# Patient Record
Sex: Female | Born: 1980 | Race: White | Hispanic: No | Marital: Married | Smoking: Former smoker
Health system: Southern US, Community
[De-identification: ages and names within clinical notes are randomized; demographics above are authoritative.]

## PROBLEM LIST (undated history)

## (undated) DIAGNOSIS — K219 Gastro-esophageal reflux disease without esophagitis: Secondary | ICD-10-CM

## (undated) DIAGNOSIS — K648 Other hemorrhoids: Secondary | ICD-10-CM

## (undated) DIAGNOSIS — T7840XA Allergy, unspecified, initial encounter: Secondary | ICD-10-CM

## (undated) DIAGNOSIS — J45909 Unspecified asthma, uncomplicated: Secondary | ICD-10-CM

## (undated) DIAGNOSIS — M1611 Unilateral primary osteoarthritis, right hip: Secondary | ICD-10-CM

## (undated) DIAGNOSIS — F419 Anxiety disorder, unspecified: Secondary | ICD-10-CM

## (undated) DIAGNOSIS — J189 Pneumonia, unspecified organism: Secondary | ICD-10-CM

## (undated) HISTORY — PX: COLONOSCOPY: SHX174

## (undated) HISTORY — DX: Other hemorrhoids: K64.8

## (undated) HISTORY — DX: Allergy, unspecified, initial encounter: T78.40XA

---

## 2000-07-21 HISTORY — PX: OVARIAN CYST SURGERY: SHX726

## 2000-11-11 ENCOUNTER — Emergency Department (HOSPITAL_COMMUNITY): Admission: EM | Admit: 2000-11-11 | Discharge: 2000-11-11 | Payer: Self-pay | Admitting: Emergency Medicine

## 2001-09-29 ENCOUNTER — Emergency Department (HOSPITAL_COMMUNITY): Admission: EM | Admit: 2001-09-29 | Discharge: 2001-09-29 | Payer: Self-pay | Admitting: Emergency Medicine

## 2002-01-12 ENCOUNTER — Emergency Department (HOSPITAL_COMMUNITY): Admission: EM | Admit: 2002-01-12 | Discharge: 2002-01-12 | Payer: Self-pay | Admitting: Emergency Medicine

## 2002-02-05 ENCOUNTER — Encounter: Payer: Self-pay | Admitting: Emergency Medicine

## 2002-02-05 ENCOUNTER — Emergency Department (HOSPITAL_COMMUNITY): Admission: EM | Admit: 2002-02-05 | Discharge: 2002-02-05 | Payer: Self-pay | Admitting: Emergency Medicine

## 2002-03-25 ENCOUNTER — Emergency Department (HOSPITAL_COMMUNITY): Admission: EM | Admit: 2002-03-25 | Discharge: 2002-03-25 | Payer: Self-pay | Admitting: Emergency Medicine

## 2002-06-23 ENCOUNTER — Emergency Department (HOSPITAL_COMMUNITY): Admission: EM | Admit: 2002-06-23 | Discharge: 2002-06-23 | Payer: Self-pay | Admitting: Emergency Medicine

## 2002-07-07 ENCOUNTER — Emergency Department (HOSPITAL_COMMUNITY): Admission: EM | Admit: 2002-07-07 | Discharge: 2002-07-07 | Payer: Self-pay | Admitting: Emergency Medicine

## 2002-12-19 ENCOUNTER — Emergency Department (HOSPITAL_COMMUNITY): Admission: EM | Admit: 2002-12-19 | Discharge: 2002-12-19 | Payer: Self-pay | Admitting: Emergency Medicine

## 2003-09-15 ENCOUNTER — Emergency Department (HOSPITAL_COMMUNITY): Admission: EM | Admit: 2003-09-15 | Discharge: 2003-09-15 | Payer: Self-pay | Admitting: Emergency Medicine

## 2003-09-22 ENCOUNTER — Emergency Department (HOSPITAL_COMMUNITY): Admission: EM | Admit: 2003-09-22 | Discharge: 2003-09-22 | Payer: Self-pay | Admitting: Emergency Medicine

## 2004-02-06 ENCOUNTER — Inpatient Hospital Stay (HOSPITAL_COMMUNITY): Admission: RE | Admit: 2004-02-06 | Discharge: 2004-02-10 | Payer: Self-pay | Admitting: Psychiatry

## 2004-05-10 ENCOUNTER — Emergency Department: Payer: Self-pay | Admitting: Unknown Physician Specialty

## 2004-08-31 ENCOUNTER — Emergency Department: Payer: Self-pay | Admitting: Emergency Medicine

## 2004-09-30 ENCOUNTER — Emergency Department: Payer: Self-pay | Admitting: Emergency Medicine

## 2004-11-17 ENCOUNTER — Emergency Department: Payer: Self-pay | Admitting: Emergency Medicine

## 2004-12-20 ENCOUNTER — Emergency Department: Payer: Self-pay | Admitting: General Practice

## 2004-12-22 ENCOUNTER — Emergency Department: Payer: Self-pay | Admitting: Emergency Medicine

## 2004-12-22 ENCOUNTER — Other Ambulatory Visit: Payer: Self-pay

## 2004-12-23 ENCOUNTER — Ambulatory Visit: Payer: Self-pay | Admitting: Emergency Medicine

## 2005-01-07 ENCOUNTER — Emergency Department: Payer: Self-pay | Admitting: Emergency Medicine

## 2005-01-19 ENCOUNTER — Emergency Department (HOSPITAL_COMMUNITY): Admission: EM | Admit: 2005-01-19 | Discharge: 2005-01-20 | Payer: Self-pay

## 2005-01-24 ENCOUNTER — Emergency Department (HOSPITAL_COMMUNITY): Admission: EM | Admit: 2005-01-24 | Discharge: 2005-01-24 | Payer: Self-pay | Admitting: Emergency Medicine

## 2005-02-03 ENCOUNTER — Inpatient Hospital Stay (HOSPITAL_COMMUNITY): Admission: AD | Admit: 2005-02-03 | Discharge: 2005-02-03 | Payer: Self-pay | Admitting: Obstetrics & Gynecology

## 2005-02-05 ENCOUNTER — Inpatient Hospital Stay (HOSPITAL_COMMUNITY): Admission: AD | Admit: 2005-02-05 | Discharge: 2005-02-05 | Payer: Self-pay | Admitting: Obstetrics and Gynecology

## 2005-02-13 ENCOUNTER — Ambulatory Visit: Payer: Self-pay | Admitting: Obstetrics and Gynecology

## 2005-03-10 ENCOUNTER — Ambulatory Visit (HOSPITAL_COMMUNITY): Admission: RE | Admit: 2005-03-10 | Discharge: 2005-03-10 | Payer: Self-pay | Admitting: Obstetrics and Gynecology

## 2005-03-10 ENCOUNTER — Ambulatory Visit: Payer: Self-pay | Admitting: Obstetrics and Gynecology

## 2005-04-08 ENCOUNTER — Ambulatory Visit: Payer: Self-pay | Admitting: Obstetrics and Gynecology

## 2005-04-13 ENCOUNTER — Inpatient Hospital Stay (HOSPITAL_COMMUNITY): Admission: AD | Admit: 2005-04-13 | Discharge: 2005-04-13 | Payer: Self-pay | Admitting: Obstetrics & Gynecology

## 2005-05-13 ENCOUNTER — Ambulatory Visit: Payer: Self-pay | Admitting: *Deleted

## 2005-07-12 ENCOUNTER — Emergency Department: Payer: Self-pay | Admitting: Emergency Medicine

## 2005-07-29 ENCOUNTER — Ambulatory Visit: Payer: Self-pay | Admitting: *Deleted

## 2005-10-14 ENCOUNTER — Ambulatory Visit: Payer: Self-pay | Admitting: *Deleted

## 2006-01-15 ENCOUNTER — Ambulatory Visit: Payer: Self-pay | Admitting: Obstetrics and Gynecology

## 2006-02-08 ENCOUNTER — Inpatient Hospital Stay (HOSPITAL_COMMUNITY): Admission: AD | Admit: 2006-02-08 | Discharge: 2006-02-08 | Payer: Self-pay | Admitting: Gynecology

## 2006-02-18 ENCOUNTER — Encounter (INDEPENDENT_AMBULATORY_CARE_PROVIDER_SITE_OTHER): Payer: Self-pay | Admitting: *Deleted

## 2006-02-18 ENCOUNTER — Ambulatory Visit: Payer: Self-pay | Admitting: Gynecology

## 2006-02-24 ENCOUNTER — Emergency Department (HOSPITAL_COMMUNITY): Admission: EM | Admit: 2006-02-24 | Discharge: 2006-02-24 | Payer: Self-pay | Admitting: Family Medicine

## 2006-04-02 ENCOUNTER — Ambulatory Visit: Payer: Self-pay | Admitting: Family Medicine

## 2006-04-13 ENCOUNTER — Emergency Department: Payer: Self-pay | Admitting: Emergency Medicine

## 2006-04-13 ENCOUNTER — Other Ambulatory Visit: Payer: Self-pay

## 2006-05-13 ENCOUNTER — Emergency Department (HOSPITAL_COMMUNITY): Admission: EM | Admit: 2006-05-13 | Discharge: 2006-05-14 | Payer: Self-pay | Admitting: Emergency Medicine

## 2006-06-24 ENCOUNTER — Ambulatory Visit: Payer: Self-pay | Admitting: Gynecology

## 2006-09-24 ENCOUNTER — Emergency Department (HOSPITAL_COMMUNITY): Admission: EM | Admit: 2006-09-24 | Discharge: 2006-09-24 | Payer: Self-pay | Admitting: Emergency Medicine

## 2007-01-08 ENCOUNTER — Emergency Department: Payer: Self-pay | Admitting: Emergency Medicine

## 2007-03-30 ENCOUNTER — Inpatient Hospital Stay (HOSPITAL_COMMUNITY): Admission: AD | Admit: 2007-03-30 | Discharge: 2007-03-30 | Payer: Self-pay | Admitting: Obstetrics & Gynecology

## 2007-04-02 ENCOUNTER — Inpatient Hospital Stay (HOSPITAL_COMMUNITY): Admission: AD | Admit: 2007-04-02 | Discharge: 2007-04-02 | Payer: Self-pay | Admitting: Obstetrics & Gynecology

## 2007-04-09 ENCOUNTER — Inpatient Hospital Stay (HOSPITAL_COMMUNITY): Admission: AD | Admit: 2007-04-09 | Discharge: 2007-04-09 | Payer: Self-pay | Admitting: Obstetrics and Gynecology

## 2007-04-19 ENCOUNTER — Emergency Department: Payer: Self-pay | Admitting: Emergency Medicine

## 2007-08-01 ENCOUNTER — Observation Stay: Payer: Self-pay | Admitting: Obstetrics and Gynecology

## 2007-09-13 ENCOUNTER — Emergency Department: Payer: Self-pay | Admitting: Emergency Medicine

## 2007-10-23 ENCOUNTER — Observation Stay: Payer: Self-pay

## 2007-11-10 ENCOUNTER — Emergency Department: Payer: Self-pay | Admitting: Emergency Medicine

## 2007-11-17 ENCOUNTER — Observation Stay: Payer: Self-pay

## 2007-11-19 ENCOUNTER — Observation Stay: Payer: Self-pay | Admitting: Obstetrics and Gynecology

## 2007-11-25 ENCOUNTER — Observation Stay: Payer: Self-pay | Admitting: Obstetrics and Gynecology

## 2007-12-01 ENCOUNTER — Inpatient Hospital Stay: Payer: Self-pay | Admitting: Obstetrics & Gynecology

## 2008-07-21 HISTORY — PX: TONSILLECTOMY: SUR1361

## 2008-07-21 HISTORY — PX: CARPAL TUNNEL RELEASE: SHX101

## 2008-08-15 ENCOUNTER — Emergency Department: Payer: Self-pay | Admitting: Emergency Medicine

## 2008-10-09 ENCOUNTER — Emergency Department: Payer: Self-pay | Admitting: Emergency Medicine

## 2008-12-30 ENCOUNTER — Emergency Department: Payer: Self-pay | Admitting: Emergency Medicine

## 2009-01-19 ENCOUNTER — Emergency Department: Payer: Self-pay | Admitting: Emergency Medicine

## 2009-02-17 ENCOUNTER — Emergency Department: Payer: Self-pay | Admitting: Emergency Medicine

## 2009-02-22 ENCOUNTER — Emergency Department: Payer: Self-pay | Admitting: Emergency Medicine

## 2009-04-05 ENCOUNTER — Ambulatory Visit: Payer: Self-pay | Admitting: Internal Medicine

## 2009-05-16 ENCOUNTER — Ambulatory Visit: Payer: Self-pay | Admitting: Orthopedic Surgery

## 2009-05-24 ENCOUNTER — Emergency Department: Payer: Self-pay | Admitting: Emergency Medicine

## 2009-05-25 ENCOUNTER — Emergency Department (HOSPITAL_COMMUNITY): Admission: EM | Admit: 2009-05-25 | Discharge: 2009-05-25 | Payer: Self-pay | Admitting: Emergency Medicine

## 2009-06-16 ENCOUNTER — Emergency Department: Payer: Self-pay | Admitting: Emergency Medicine

## 2009-06-21 ENCOUNTER — Ambulatory Visit: Payer: Self-pay | Admitting: Gastroenterology

## 2009-07-25 ENCOUNTER — Ambulatory Visit: Payer: Self-pay | Admitting: Rheumatology

## 2009-08-10 ENCOUNTER — Ambulatory Visit: Payer: Self-pay | Admitting: Specialist

## 2009-08-13 ENCOUNTER — Ambulatory Visit: Payer: Self-pay | Admitting: Specialist

## 2009-09-19 ENCOUNTER — Ambulatory Visit: Payer: Self-pay | Admitting: Pain Medicine

## 2009-10-11 ENCOUNTER — Ambulatory Visit: Payer: Self-pay | Admitting: Pain Medicine

## 2010-01-06 ENCOUNTER — Ambulatory Visit: Payer: Self-pay | Admitting: Pulmonary Disease

## 2010-01-06 ENCOUNTER — Inpatient Hospital Stay (HOSPITAL_COMMUNITY): Admission: EM | Admit: 2010-01-06 | Discharge: 2010-01-11 | Payer: Self-pay | Admitting: Pulmonary Disease

## 2010-01-06 ENCOUNTER — Emergency Department: Payer: Self-pay

## 2010-01-07 ENCOUNTER — Ambulatory Visit: Payer: Self-pay | Admitting: Psychiatry

## 2010-01-08 ENCOUNTER — Ambulatory Visit: Payer: Self-pay | Admitting: Psychiatry

## 2010-01-09 ENCOUNTER — Ambulatory Visit: Payer: Self-pay | Admitting: Psychiatry

## 2010-01-10 ENCOUNTER — Ambulatory Visit: Payer: Self-pay | Admitting: Psychiatry

## 2010-01-11 ENCOUNTER — Inpatient Hospital Stay (HOSPITAL_COMMUNITY): Admission: EM | Admit: 2010-01-11 | Discharge: 2010-01-16 | Payer: Self-pay | Admitting: Psychiatry

## 2010-01-11 ENCOUNTER — Ambulatory Visit: Payer: Self-pay | Admitting: Psychiatry

## 2010-03-17 ENCOUNTER — Emergency Department: Payer: Self-pay | Admitting: Emergency Medicine

## 2010-09-15 ENCOUNTER — Emergency Department: Payer: Self-pay | Admitting: Emergency Medicine

## 2010-10-06 LAB — CULTURE, RESPIRATORY W GRAM STAIN

## 2010-10-06 LAB — URINE MICROSCOPIC-ADD ON

## 2010-10-06 LAB — CBC
HCT: 30.7 % — ABNORMAL LOW (ref 36.0–46.0)
HCT: 32 % — ABNORMAL LOW (ref 36.0–46.0)
HCT: 33.4 % — ABNORMAL LOW (ref 36.0–46.0)
Hemoglobin: 10.2 g/dL — ABNORMAL LOW (ref 12.0–15.0)
Hemoglobin: 11 g/dL — ABNORMAL LOW (ref 12.0–15.0)
Hemoglobin: 11.2 g/dL — ABNORMAL LOW (ref 12.0–15.0)
Hemoglobin: 11.4 g/dL — ABNORMAL LOW (ref 12.0–15.0)
MCH: 32.6 pg (ref 26.0–34.0)
MCH: 33.2 pg (ref 26.0–34.0)
MCHC: 34.1 g/dL (ref 30.0–36.0)
MCHC: 34.3 g/dL (ref 30.0–36.0)
MCHC: 35 g/dL (ref 30.0–36.0)
MCV: 94.8 fL (ref 78.0–100.0)
MCV: 95 fL (ref 78.0–100.0)
MCV: 95.1 fL (ref 78.0–100.0)
MCV: 96.2 fL (ref 78.0–100.0)
Platelets: 102 10*3/uL — ABNORMAL LOW (ref 150–400)
Platelets: 105 10*3/uL — ABNORMAL LOW (ref 150–400)
Platelets: 125 10*3/uL — ABNORMAL LOW (ref 150–400)
Platelets: 185 10*3/uL (ref 150–400)
RBC: 3.2 MIL/uL — ABNORMAL LOW (ref 3.87–5.11)
RBC: 3.37 MIL/uL — ABNORMAL LOW (ref 3.87–5.11)
RBC: 3.47 MIL/uL — ABNORMAL LOW (ref 3.87–5.11)
RBC: 3.53 MIL/uL — ABNORMAL LOW (ref 3.87–5.11)
RDW: 13.6 % (ref 11.5–15.5)
RDW: 13.7 % (ref 11.5–15.5)
RDW: 13.9 % (ref 11.5–15.5)
WBC: 11.6 10*3/uL — ABNORMAL HIGH (ref 4.0–10.5)

## 2010-10-06 LAB — BLOOD GAS, ARTERIAL
O2 Saturation: 94.9 %
PEEP: 5 cmH2O
PEEP: 5 cmH2O
Pressure support: 5 cmH2O
RATE: 16 resp/min
pCO2 arterial: 39.2 mmHg (ref 35.0–45.0)
pH, Arterial: 7.299 — ABNORMAL LOW (ref 7.350–7.400)
pO2, Arterial: 73.9 mmHg — ABNORMAL LOW (ref 80.0–100.0)
pO2, Arterial: 77.5 mmHg — ABNORMAL LOW (ref 80.0–100.0)

## 2010-10-06 LAB — CARDIAC PANEL(CRET KIN+CKTOT+MB+TROPI)
CK, MB: 52.7 ng/mL (ref 0.3–4.0)
Relative Index: 3.3 — ABNORMAL HIGH (ref 0.0–2.5)
Total CK: 1598 U/L — ABNORMAL HIGH (ref 7–177)
Troponin I: 0.09 ng/mL — ABNORMAL HIGH (ref 0.00–0.06)

## 2010-10-06 LAB — DIFFERENTIAL
Basophils Absolute: 0 10*3/uL (ref 0.0–0.1)
Basophils Absolute: 0 10*3/uL (ref 0.0–0.1)
Eosinophils Absolute: 0 10*3/uL (ref 0.0–0.7)
Eosinophils Relative: 0 % (ref 0–5)
Lymphocytes Relative: 5 % — ABNORMAL LOW (ref 12–46)
Lymphocytes Relative: 8 % — ABNORMAL LOW (ref 12–46)
Monocytes Absolute: 0.6 10*3/uL (ref 0.1–1.0)
Neutro Abs: 8.6 10*3/uL — ABNORMAL HIGH (ref 1.7–7.7)
Neutrophils Relative %: 92 % — ABNORMAL HIGH (ref 43–77)

## 2010-10-06 LAB — RAPID URINE DRUG SCREEN, HOSP PERFORMED
Amphetamines: NOT DETECTED
Opiates: POSITIVE — AB
Tetrahydrocannabinol: NOT DETECTED

## 2010-10-06 LAB — BASIC METABOLIC PANEL
BUN: 2 mg/dL — ABNORMAL LOW (ref 6–23)
BUN: 6 mg/dL (ref 6–23)
CO2: 32 mEq/L (ref 19–32)
CO2: 33 mEq/L — ABNORMAL HIGH (ref 19–32)
Calcium: 7 mg/dL — ABNORMAL LOW (ref 8.4–10.5)
Chloride: 101 mEq/L (ref 96–112)
Chloride: 109 mEq/L (ref 96–112)
Chloride: 97 mEq/L (ref 96–112)
Creatinine, Ser: 0.63 mg/dL (ref 0.4–1.2)
GFR calc Af Amer: >60 mL/min (ref >60)
GFR calc Af Amer: >60 mL/min (ref >60)
GFR calc non Af Amer: >60 mL/min (ref >60)
Glucose, Bld: 86 mg/dL (ref 70–99)
Glucose, Bld: 98 mg/dL (ref 70–99)
Glucose, Bld: 99 mg/dL (ref 70–99)
Potassium: 2.8 mEq/L — ABNORMAL LOW (ref 3.5–5.1)
Potassium: 3.8 mEq/L (ref 3.5–5.1)
Sodium: 136 mEq/L (ref 135–145)
Sodium: 138 mEq/L (ref 135–145)
Sodium: 139 mEq/L (ref 135–145)

## 2010-10-06 LAB — URINALYSIS, ROUTINE W REFLEX MICROSCOPIC
Bilirubin Urine: NEGATIVE
Ketones, ur: 40 mg/dL — AB
Nitrite: NEGATIVE

## 2010-10-06 LAB — COMPREHENSIVE METABOLIC PANEL
ALT: 96 U/L — ABNORMAL HIGH (ref 0–35)
Albumin: 2.9 g/dL — ABNORMAL LOW (ref 3.5–5.2)
Calcium: 6.6 mg/dL — ABNORMAL LOW (ref 8.4–10.5)
Glucose, Bld: 70 mg/dL (ref 70–99)
Potassium: 3.8 mEq/L (ref 3.5–5.1)
Sodium: 140 mEq/L (ref 135–145)
Total Protein: 5 g/dL — ABNORMAL LOW (ref 6.0–8.3)

## 2010-10-06 LAB — URINE CULTURE: Special Requests: NEGATIVE

## 2010-10-06 LAB — CULTURE, BLOOD (ROUTINE X 2)

## 2010-10-06 LAB — LIPASE, BLOOD: Lipase: 25 U/L (ref 11–59)

## 2010-10-06 LAB — ACETAMINOPHEN LEVEL: Acetaminophen (Tylenol), Serum: <10 ug/mL — ABNORMAL LOW (ref 10–30)

## 2010-10-06 LAB — AMYLASE: Amylase: 164 U/L — ABNORMAL HIGH (ref 0–105)

## 2010-10-19 ENCOUNTER — Emergency Department: Payer: Self-pay | Admitting: Internal Medicine

## 2010-10-22 ENCOUNTER — Inpatient Hospital Stay: Payer: Self-pay | Admitting: Internal Medicine

## 2010-12-03 IMAGING — CT CT HEAD W/O CM
1 of 2 series · 13 of 30 positions shown, 17 images · non-contrast
Comparison: None.

CLINICAL DATA: Drug overdose/altered mental status/respiratory
failure

CT HEAD WITHOUT CONTRAST
TECHNIQUE: Contiguous axial images were obtained from the base of
the skull through the vertex without contrast.

[Series 2: brain · axial · 0.47mm/px · z∈[+143,+261]mm · 13 of 48 slices shown, 17 images]
[im 4/48  brain]
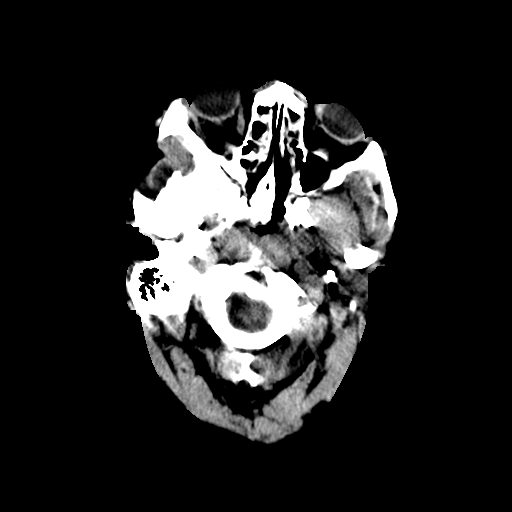
[im 4/48  bone]
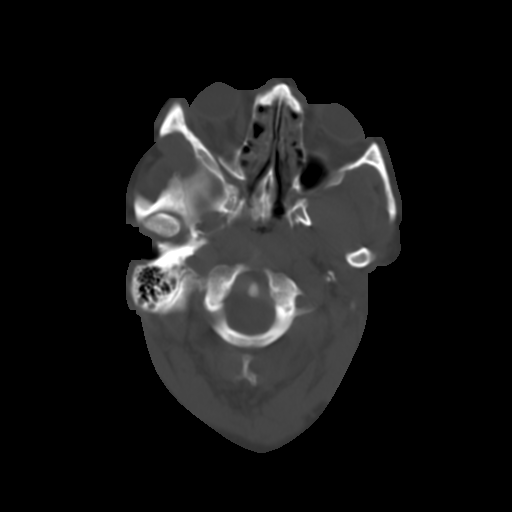
[im 7/48  brain]
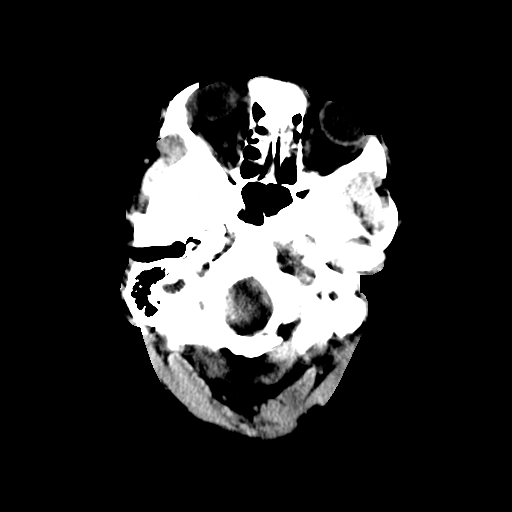
[im 11/48  brain]
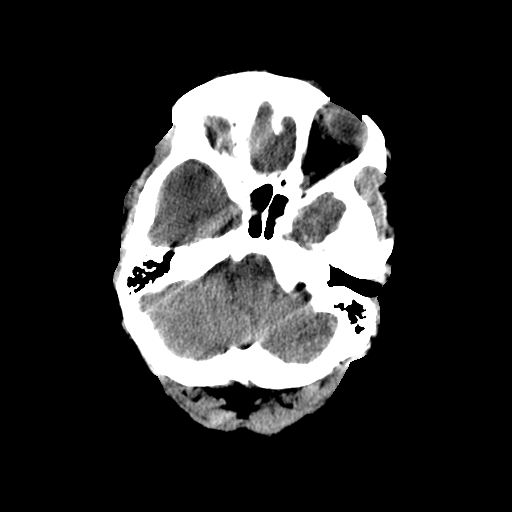
[im 14/48  brain]
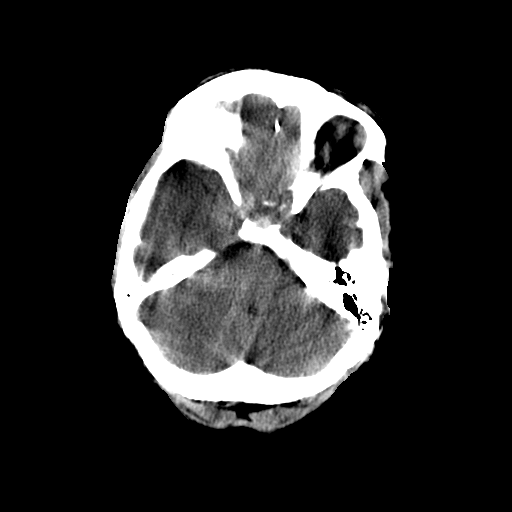
[im 17/48  brain]
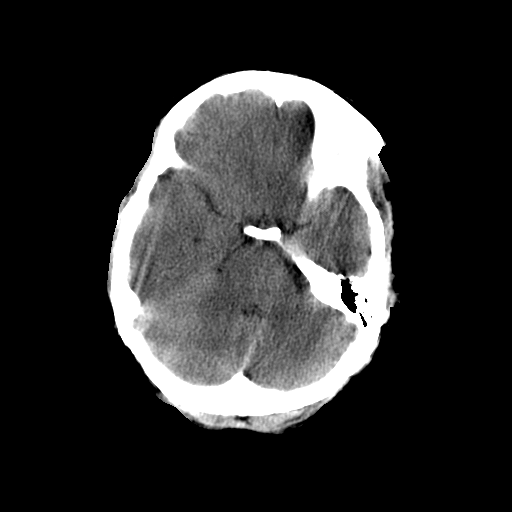
[im 17/48  bone]
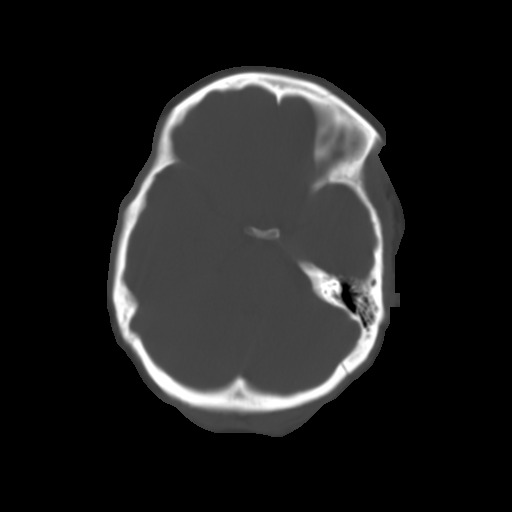
[im 21/48  brain]
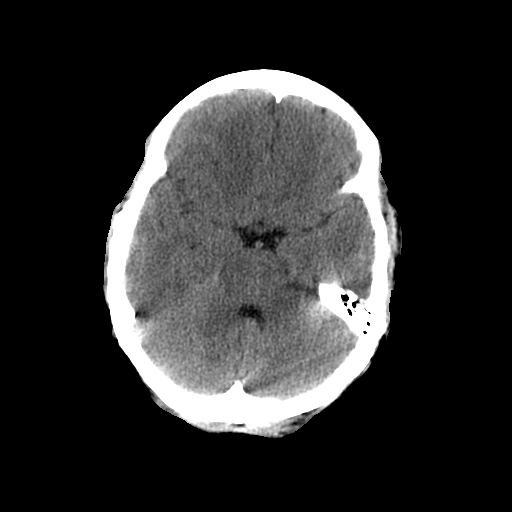
[im 24/48  brain]
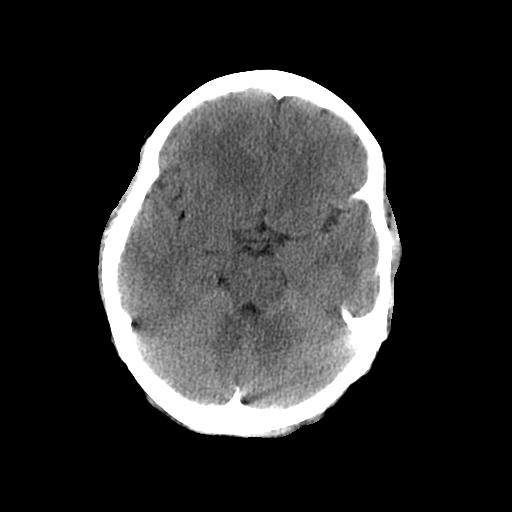
[im 27/48  brain]
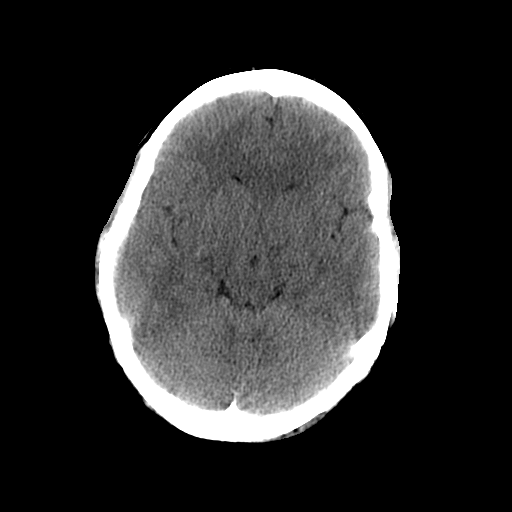
[im 31/48  brain]
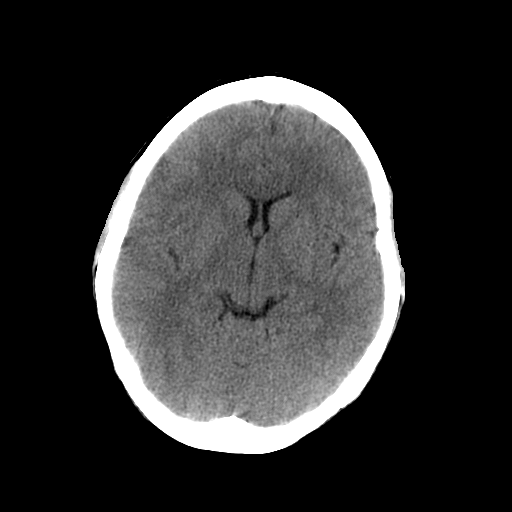
[im 31/48  bone]
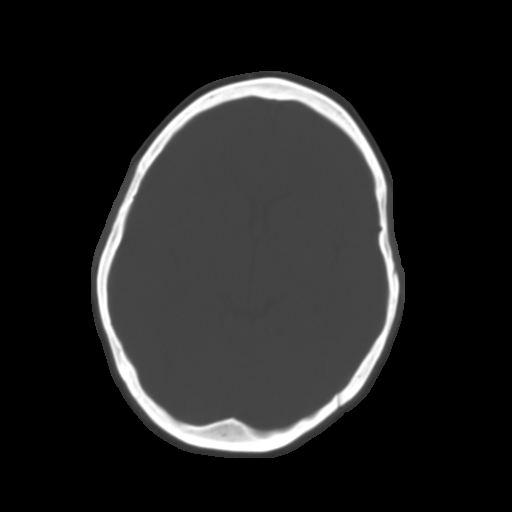
[im 34/48  brain]
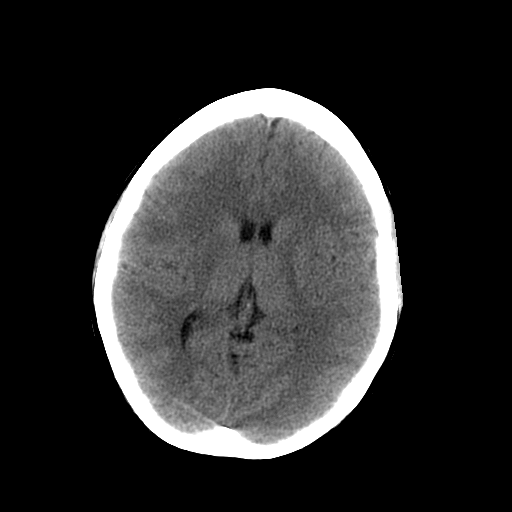
[im 37/48  brain]
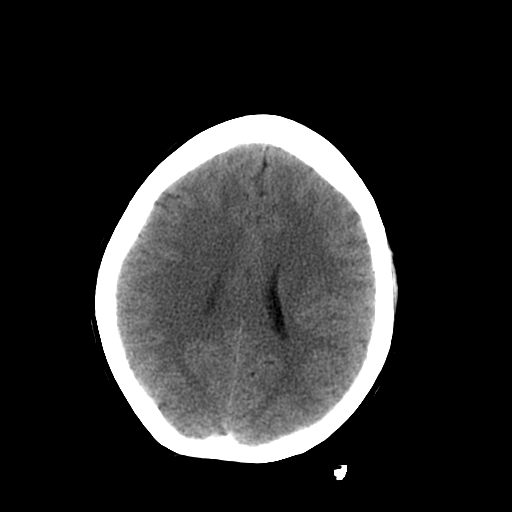
[im 41/48  brain]
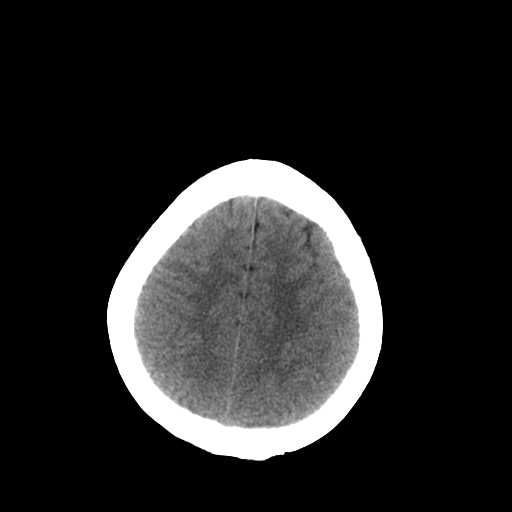
[im 44/48  brain]
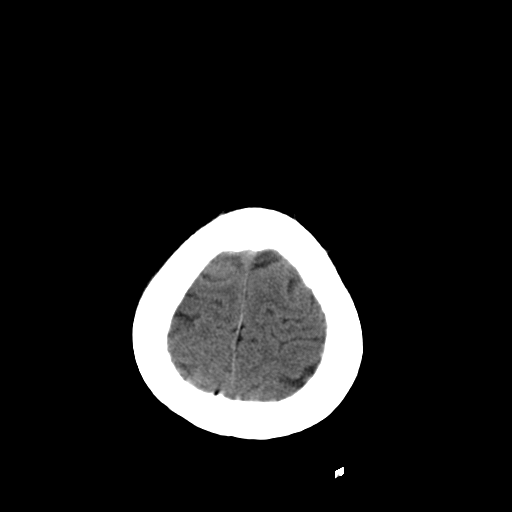
[im 44/48  bone]
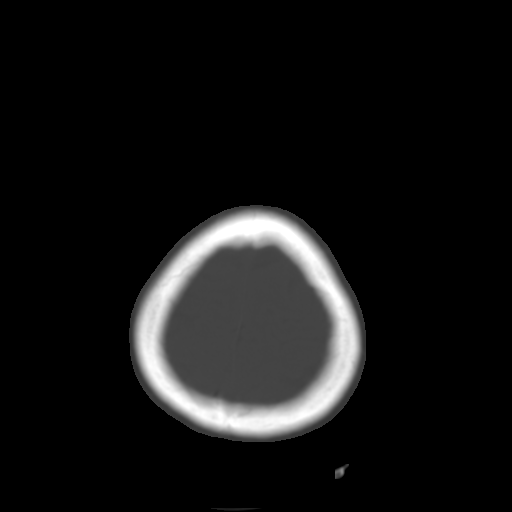

[13 of 30 positions shown; findings below may reference images not displayed]

FINDINGS: Ventricular size and CSF spaces normal.  On image number
19 of series 2, there is a small high density focus in the left
frontoparietal white matter that looks like calcification as
opposed to acute blood.  However, there is also possible edema of
the white matter in this area.  I reviewed this case with what of
our neuroradiologist.  The concern is that this may represent
toxoplasmosis.  Careful clinical correlation is warranted.  MRI
recommended for further assessment. No other acute or focal
findings.

There are changes of chronic sinusitis.
IMPRESSION: 1.  Small calcification in the left frontoparietal white matter
with a question of surrounding edema.  Question toxoplasmosis.  MRI
recommended for further assessment.
2.  No other acute or focal findings.

## 2010-12-06 NOTE — Group Therapy Note (Signed)
Karen [name], Karen [name]                 ACCOUNT NO.:  [id number]   MEDICAL RECORD NO.:  [id number]          PATIENT TYPE:  WOC   LOCATION:  [facility]                   FACILITY:  [facility]   PHYSICIAN:  Argentina Donovan, MD        DATE OF BIRTH:  11/15/1980   DATE OF SERVICE:  04/08/2005                                    CLINIC NOTE   The patient is a 30 year old white female who ultrasound diagnostic  laparoscopy and lysis of a few pelvic adhesions for chronic pelvic pain in  August.  The pelvic examination under laparoscopy was virtually normal with  the exception of a few pelvic adhesions that could not explain her pain.  She is seen today tearful complaining of lower abdominal pain.  She cannot  sleep when she lays on her stomach.  The incision is well healed.  There is  no sign of rebound or abdominal pain on palpation or guarding.  The abdomen  is soft and flat.  She does have urinary urgency and frequency and nocturia  and the possibility of a bladder pain syndrome, I think may be very real.  I  am going to try her Pyridium and refer her to a urologist.  If this is of no  help I think that she needs to be seen in the pain clinic.  Patient is on  Prozac at the present time 20 mg q.a.m.  Takes no other medications.   IMPRESSION:  Chronic pelvic pain of unknown origin, possibly urological.           ______________________________  Argentina Donovan, MD     PR/MEDQ  D:  04/08/2005  T:  04/09/2005  Job:  [id number]

## 2010-12-06 NOTE — Op Note (Signed)
[name], [name]                 ACCOUNT NO.:  [id number]   MEDICAL RECORD NO.:  [id number]          PATIENT TYPE:  AMB   LOCATION:  [facility]                           [facility]ACILITY[facility]   PHYSICIAN:  Phil D. Okey Dupre, M.D.     DATE OF BIRTH:  08/26/1980   DATE OF PROCEDURE:  03/10/2005  DATE OF DISCHARGE:                                 OPERATIVE REPORT   PROCEDURE:  Diagnostic laparoscopy and lysis of pelvic adhesions.   PREOPERATIVE DIAGNOSIS:  Chronic pelvic pain.   POSTOPERATIVE DIAGNOSIS:  Normal female pelvis with a few filmy pelvic  adhesions.   SURGEON:  Javier Glazier. Okey Dupre, M.D.   ANESTHESIA:  General.   SPECIMENS:  None.   POSTOPERATIVE CONDITION:  Satisfactory.   OPERATIVE FINDINGS:  Behind the uterus we saw a few filmy adhesions as well  as from the ovary to the sigmoid colon but these allowed this to the ovary  and the colon to move without difficulty.  There is no tension.  There is no  trapped ovary.  There is no sign of endometriosis, and I cannot explain why  these small adhesions might cause the kind of pain the patient was  complaining of.   DESCRIPTION OF PROCEDURE:  Under satisfactory general anesthesia, the  patient in dorsal semilithotomy position, the perineum, vagina and abdomen  were prepped and draped in the usual sterile manner. Bimanual pelvic  examination revealed the uterus was normal size, shape, consistency. Adnexa  were normal.  Cul-de-sac was free.  A weighted speculum was placed at the  posterior fourchette of the vagina. Anterior lip of the cervix grasped with  a single-tooth tenaculum and an acorn cannula placed in the cervix for  mobilization of the uterus.  This was attached to the tenaculum.  A 1 cm  transverse incision was made just below the umbilicus and a Veress needle  inserted in the peritoneal cavity.  Approximately 1 liter of carbon dioxide  was slowly insufflated into the peritoneal cavity.  The Veress needle was  removed and the trocar  inserted the scope.  The trocar was removed from the  sleeve.  The laparoscope inserted.  Pelvic organs easily visualized.  The  findings were as above.  Using a toothed and the smooth grasping forceps, as  much of the adhesions as possible could be were separated and broken up.  However, they were so filmy and I could not see any significant reason or  explanation from them causing all the pain that the patient complained of.  There was a small paraovarian cyst on the left side but that was also freely  mobile and no way distorted.  There was no sign of hyperemia, endometriosis  or any other explanation for the patient's problem.  The upper abdominal  viscera was explored also and found to be normal.  There was no sign of any  adhesions around the liver.  It was decided to terminate the procedure.  As  much CO2 as possible was expressed through the sleeve.  The  sleeve was removed, and then the incision  closed with a 2-0 Vicryl suture  which was run up for a subcuticular closure.  Dry sterile dressings applied.  The patient transferred recovery in satisfactory condition having tolerated  the procedure.           ______________________________  Javier Glazier. Okey Dupre, M.D.     PDR/MEDQ  D:  03/10/2005  T:  03/11/2005  Job:  [id number]
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
## 2010-12-06 NOTE — Group Therapy Note (Signed)
Karen [name], Karen [name] NO.:  [id number]   MEDICAL RECORD NO.:  [id number]          PATIENT TYPE:  WOC   LOCATION:  [facility]                   FACILITY:  [facility]   PHYSICIAN:  Argentina Donovan, MD        DATE OF BIRTH:  18-Aug-1980   DATE OF SERVICE:                                    CLINIC NOTE   HISTORY OF PRESENT ILLNESS:  The patient is a 30 year old nulligravida white  female who has had primary dysmenorrhea significantly since the age of 73,  and was tried for 3 months at that time on birth control pills in which she  bleed the entire time.  She went in to the [facility] recently because of the  severe pelvic pain, and ultrasound showed a few functional cysts on her left  ovary, although her pain is mainly on the right.  She has a history recently  of sporadic episodes of diarrhea.  The pain in her abdomen for the most part  seems to be in the pelvis although dysmenorrhea seems to be worse starting a  few days before her periods and extending 2 days during her period.  She has  a history of panic syndrome, and was on Xanax, which she stopped voluntarily  because she was afraid of getting addicted to that.  I am going to start her  on Prozac 10.  If that is not sufficient, we will increase her to 20 after a  decent try.  I am also going to give her a shot of Depo-Provera to see if  that will control her pain and cysts, and if she is amenorrheic, she may be  symptom free.  She is getting married in about 3 months.  She has had  unprotected intercourse with one man for 14 years, and her present  companion, who has 4 children, for 2 years without using contraceptive.   IMPRESSION:  1.  Primary dysmenorrhea.  2.  Pelvic pain, chronic.       PR/MEDQ  D:  02/13/2005  T:  02/13/2005  Job:  [id number]

## 2011-02-05 ENCOUNTER — Emergency Department: Payer: Self-pay | Admitting: Emergency Medicine

## 2011-02-12 ENCOUNTER — Emergency Department: Payer: Self-pay | Admitting: Emergency Medicine

## 2011-02-21 ENCOUNTER — Emergency Department: Payer: Self-pay | Admitting: Emergency Medicine

## 2011-05-02 LAB — CBC
HCT: 37.4
Hemoglobin: 13.1
RDW: 13

## 2011-05-02 LAB — URINALYSIS, ROUTINE W REFLEX MICROSCOPIC
Bilirubin Urine: NEGATIVE
Hgb urine dipstick: NEGATIVE
Ketones, ur: NEGATIVE
Protein, ur: NEGATIVE
Specific Gravity, Urine: 1.025
Urobilinogen, UA: 0.2

## 2011-05-02 LAB — POCT PREGNANCY, URINE: Operator id: 114931

## 2011-05-02 LAB — GC/CHLAMYDIA PROBE AMP, GENITAL: Chlamydia, DNA Probe: NEGATIVE

## 2011-05-02 LAB — WET PREP, GENITAL

## 2011-05-10 ENCOUNTER — Emergency Department: Payer: Self-pay | Admitting: *Deleted

## 2011-06-06 ENCOUNTER — Emergency Department: Payer: Self-pay | Admitting: Emergency Medicine

## 2011-07-22 HISTORY — PX: HIP SURGERY: SHX245

## 2011-09-24 ENCOUNTER — Emergency Department: Payer: Self-pay

## 2011-09-24 LAB — COMPREHENSIVE METABOLIC PANEL
Anion Gap: 11 (ref 7–16)
Bilirubin,Total: 0.2 mg/dL (ref 0.2–1.0)
Chloride: 110 mmol/L — ABNORMAL HIGH (ref 98–107)
Co2: 24 mmol/L (ref 21–32)
Creatinine: 0.79 mg/dL (ref 0.60–1.30)
EGFR (African American): >60
EGFR (Non-African Amer.): >60
Osmolality: 291 (ref 275–301)
Potassium: 3.8 mmol/L (ref 3.5–5.1)
Sodium: 145 mmol/L (ref 136–145)

## 2011-09-24 LAB — URINALYSIS, COMPLETE
Bacteria: NONE SEEN
Glucose,UR: NEGATIVE mg/dL (ref 0–75)
Leukocyte Esterase: NEGATIVE
Nitrite: NEGATIVE
Protein: NEGATIVE
WBC UR: 1 /HPF (ref 0–5)

## 2011-09-24 LAB — CBC
MCHC: 33.8 g/dL (ref 32.0–36.0)
Platelet: 193 10*3/uL (ref 150–440)
RBC: 4.09 10*6/uL (ref 3.80–5.20)
RDW: 13.2 % (ref 11.5–14.5)

## 2011-09-24 LAB — PREGNANCY, URINE: Pregnancy Test, Urine: NEGATIVE m[IU]/mL

## 2011-10-14 ENCOUNTER — Emergency Department: Payer: Self-pay | Admitting: Emergency Medicine

## 2011-10-14 LAB — RAPID INFLUENZA A&B ANTIGENS

## 2011-10-14 LAB — URINALYSIS, COMPLETE
Bilirubin,UR: NEGATIVE
Leukocyte Esterase: NEGATIVE
Nitrite: NEGATIVE
Protein: NEGATIVE
Specific Gravity: 1.019 (ref 1.003–1.030)
Squamous Epithelial: 20
WBC UR: 1 /HPF (ref 0–5)

## 2011-10-14 LAB — DRUG SCREEN, URINE
Amphetamines, Ur Screen: NEGATIVE (ref ?–1000)
Benzodiazepine, Ur Scrn: NEGATIVE (ref ?–200)
Cocaine Metabolite,Ur ~~LOC~~: NEGATIVE (ref ?–300)
MDMA (Ecstasy)Ur Screen: NEGATIVE (ref ?–500)
Opiate, Ur Screen: POSITIVE (ref ?–300)
Tricyclic, Ur Screen: NEGATIVE (ref ?–1000)

## 2012-01-15 ENCOUNTER — Emergency Department: Payer: Self-pay | Admitting: Emergency Medicine

## 2012-01-15 LAB — COMPREHENSIVE METABOLIC PANEL
Alkaline Phosphatase: 82 U/L (ref 50–136)
Calcium, Total: 8.2 mg/dL — ABNORMAL LOW (ref 8.5–10.1)
EGFR (African American): >60
Glucose: 73 mg/dL (ref 65–99)
SGOT(AST): 24 U/L (ref 15–37)
SGPT (ALT): 23 U/L
Sodium: 139 mmol/L (ref 136–145)
Total Protein: 6.7 g/dL (ref 6.4–8.2)

## 2012-01-15 LAB — URINALYSIS, COMPLETE
Glucose,UR: NEGATIVE mg/dL (ref 0–75)
Nitrite: POSITIVE
Ph: 5 (ref 4.5–8.0)
RBC,UR: 4 /HPF (ref 0–5)
Squamous Epithelial: 12

## 2012-01-15 LAB — CBC
HGB: 12.9 g/dL (ref 12.0–16.0)
RBC: 4.14 10*6/uL (ref 3.80–5.20)
WBC: 7.7 10*3/uL (ref 3.6–11.0)

## 2012-01-17 ENCOUNTER — Emergency Department: Payer: Self-pay | Admitting: *Deleted

## 2012-01-30 ENCOUNTER — Emergency Department: Payer: Self-pay | Admitting: Unknown Physician Specialty

## 2012-01-30 LAB — CBC
HCT: 43.2 % (ref 35.0–47.0)
HGB: 14 g/dL (ref 12.0–16.0)
MCH: 30.9 pg (ref 26.0–34.0)
MCHC: 32.5 g/dL (ref 32.0–36.0)
MCV: 95 fL (ref 80–100)

## 2012-01-30 LAB — BASIC METABOLIC PANEL
BUN: 19 mg/dL — ABNORMAL HIGH (ref 7–18)
Calcium, Total: 8.8 mg/dL (ref 8.5–10.1)
Creatinine: 0.81 mg/dL (ref 0.60–1.30)
EGFR (African American): >60
EGFR (Non-African Amer.): >60
Glucose: 83 mg/dL (ref 65–99)
Potassium: 3.9 mmol/L (ref 3.5–5.1)
Sodium: 142 mmol/L (ref 136–145)

## 2012-01-30 LAB — URINALYSIS, COMPLETE
Bilirubin,UR: NEGATIVE
Ketone: NEGATIVE
Ph: 5 (ref 4.5–8.0)
Squamous Epithelial: 4

## 2012-04-16 DIAGNOSIS — F411 Generalized anxiety disorder: Secondary | ICD-10-CM | POA: Insufficient documentation

## 2012-04-16 DIAGNOSIS — G894 Chronic pain syndrome: Secondary | ICD-10-CM | POA: Insufficient documentation

## 2012-04-16 DIAGNOSIS — G44209 Tension-type headache, unspecified, not intractable: Secondary | ICD-10-CM | POA: Insufficient documentation

## 2012-04-16 DIAGNOSIS — F329 Major depressive disorder, single episode, unspecified: Secondary | ICD-10-CM | POA: Insufficient documentation

## 2012-04-16 DIAGNOSIS — G8929 Other chronic pain: Secondary | ICD-10-CM | POA: Insufficient documentation

## 2012-04-16 DIAGNOSIS — F322 Major depressive disorder, single episode, severe without psychotic features: Secondary | ICD-10-CM | POA: Insufficient documentation

## 2012-06-10 DIAGNOSIS — M25559 Pain in unspecified hip: Secondary | ICD-10-CM | POA: Insufficient documentation

## 2012-08-01 ENCOUNTER — Emergency Department: Payer: Self-pay | Admitting: Emergency Medicine

## 2012-08-01 LAB — CBC
HGB: 12.9 g/dL (ref 12.0–16.0)
MCHC: 34 g/dL (ref 32.0–36.0)
MCV: 92 fL (ref 80–100)

## 2012-08-01 LAB — URINALYSIS, COMPLETE
Bilirubin,UR: NEGATIVE
Ketone: NEGATIVE
Nitrite: NEGATIVE
Protein: NEGATIVE
RBC,UR: 3 /HPF (ref 0–5)
Specific Gravity: 1.017 (ref 1.003–1.030)
Squamous Epithelial: <1

## 2012-08-01 LAB — COMPREHENSIVE METABOLIC PANEL
Anion Gap: 7 (ref 7–16)
BUN: 20 mg/dL — ABNORMAL HIGH (ref 7–18)
Calcium, Total: 8.6 mg/dL (ref 8.5–10.1)
EGFR (African American): >60
EGFR (Non-African Amer.): >60
Osmolality: 285 (ref 275–301)
Potassium: 4 mmol/L (ref 3.5–5.1)
SGPT (ALT): 23 U/L (ref 12–78)
Total Protein: 7 g/dL (ref 6.4–8.2)

## 2012-11-22 DIAGNOSIS — Z9189 Other specified personal risk factors, not elsewhere classified: Secondary | ICD-10-CM | POA: Insufficient documentation

## 2012-11-22 DIAGNOSIS — F1190 Opioid use, unspecified, uncomplicated: Secondary | ICD-10-CM | POA: Insufficient documentation

## 2012-11-22 DIAGNOSIS — K429 Umbilical hernia without obstruction or gangrene: Secondary | ICD-10-CM | POA: Insufficient documentation

## 2012-11-22 DIAGNOSIS — D171 Benign lipomatous neoplasm of skin and subcutaneous tissue of trunk: Secondary | ICD-10-CM | POA: Insufficient documentation

## 2012-11-22 DIAGNOSIS — K439 Ventral hernia without obstruction or gangrene: Secondary | ICD-10-CM | POA: Insufficient documentation

## 2013-03-04 ENCOUNTER — Emergency Department: Payer: Self-pay | Admitting: Emergency Medicine

## 2013-03-04 LAB — URINALYSIS, COMPLETE
Bilirubin,UR: NEGATIVE
Glucose,UR: NEGATIVE mg/dL (ref 0–75)
Nitrite: NEGATIVE
Ph: 5 (ref 4.5–8.0)
RBC,UR: 5 /HPF (ref 0–5)

## 2013-03-04 LAB — PREGNANCY, URINE: Pregnancy Test, Urine: NEGATIVE m[IU]/mL

## 2013-03-06 LAB — URINE CULTURE

## 2013-04-18 ENCOUNTER — Emergency Department: Payer: Self-pay | Admitting: Emergency Medicine

## 2013-05-06 ENCOUNTER — Emergency Department: Payer: Self-pay | Admitting: Emergency Medicine

## 2013-05-06 LAB — CBC
HGB: 12.9 g/dL (ref 12.0–16.0)
MCV: 92 fL (ref 80–100)
Platelet: 213 10*3/uL (ref 150–440)
RBC: 4.08 10*6/uL (ref 3.80–5.20)
WBC: 9.7 10*3/uL (ref 3.6–11.0)

## 2013-05-06 LAB — COMPREHENSIVE METABOLIC PANEL
Anion Gap: 8 (ref 7–16)
BUN: 25 mg/dL — ABNORMAL HIGH (ref 7–18)
Chloride: 105 mmol/L (ref 98–107)
EGFR (African American): >60
EGFR (Non-African Amer.): >60
SGPT (ALT): 29 U/L (ref 12–78)
Total Protein: 6.9 g/dL (ref 6.4–8.2)

## 2013-05-06 LAB — URINALYSIS, COMPLETE
Bilirubin,UR: NEGATIVE
Glucose,UR: NEGATIVE mg/dL (ref 0–75)
Ketone: NEGATIVE
Ph: 6 (ref 4.5–8.0)
RBC,UR: 2 /HPF (ref 0–5)
Squamous Epithelial: 12

## 2013-05-06 LAB — DRUG SCREEN, URINE
MDMA (Ecstasy)Ur Screen: NEGATIVE (ref ?–500)
Methadone, Ur Screen: NEGATIVE (ref ?–300)

## 2013-05-06 LAB — ETHANOL: Ethanol: <3 mg/dL

## 2013-06-27 IMAGING — CT CT ABD-PELV W/ CM
1 of 2 series · 14 of 32 positions shown, 18 images · non-contrast
Comparison: none

REASON FOR EXAM: (1) mid to right abd pain with distention; (2) same
COMMENTS:

[Series 2: 3mm soft tissue · axial · 0.68mm/px · z∈[-448,-76]mm · 14 of 136 slices shown, 18 images]
[im 6/136  soft-tissue]
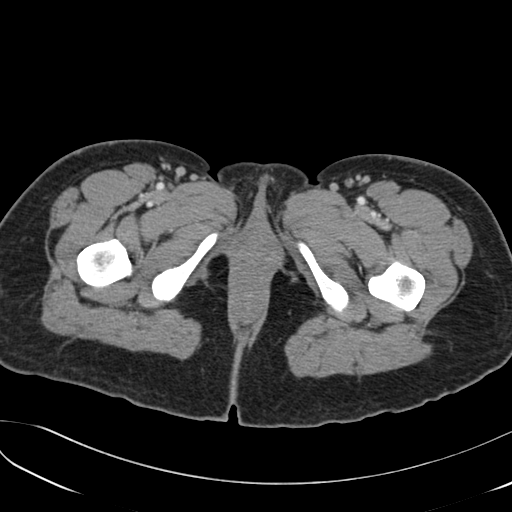
[im 6/136  bone]
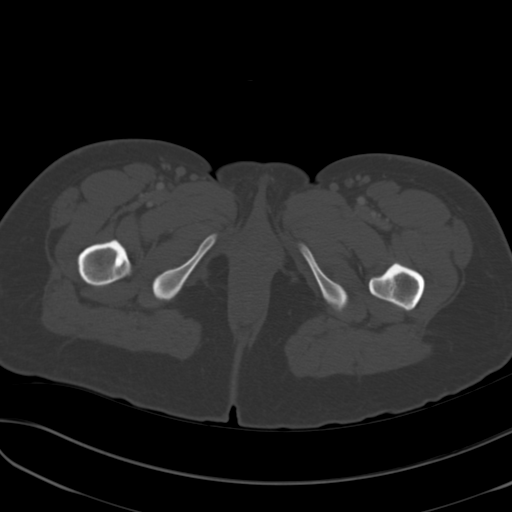
[im 17/136  soft-tissue]
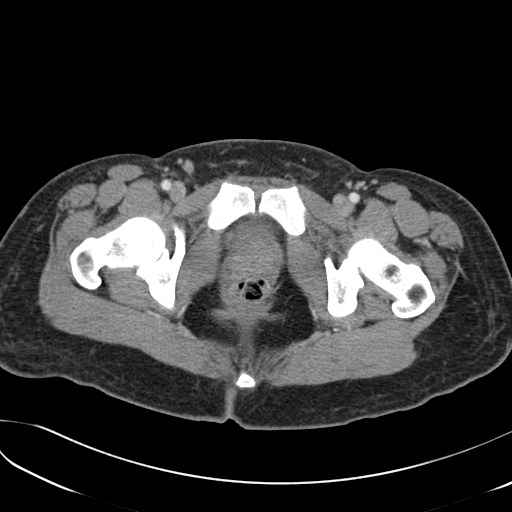
[im 29/136  soft-tissue]
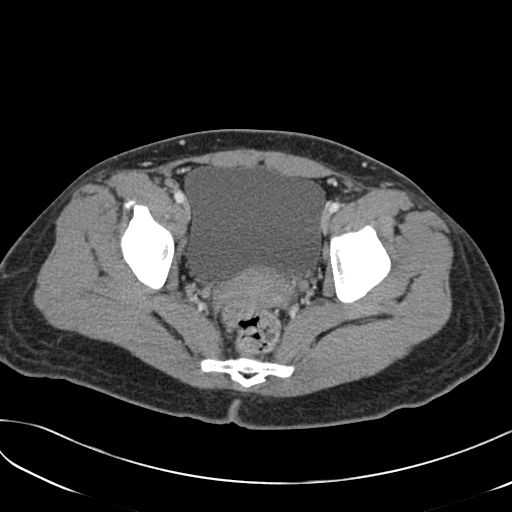
[im 40/136  soft-tissue]
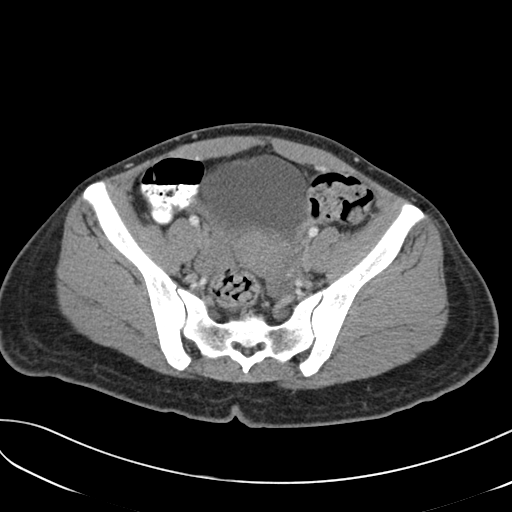
[im 51/136  soft-tissue]
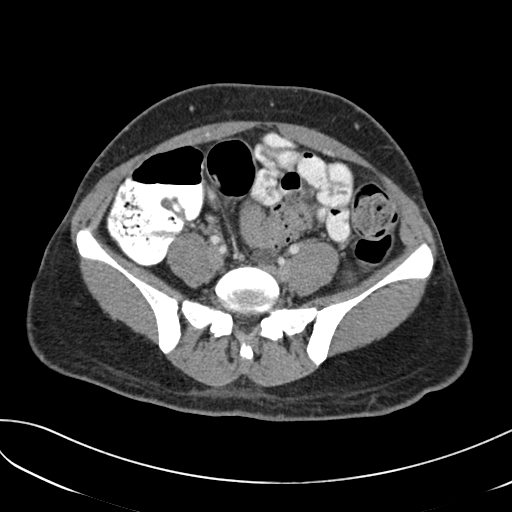
[im 62/136  soft-tissue]
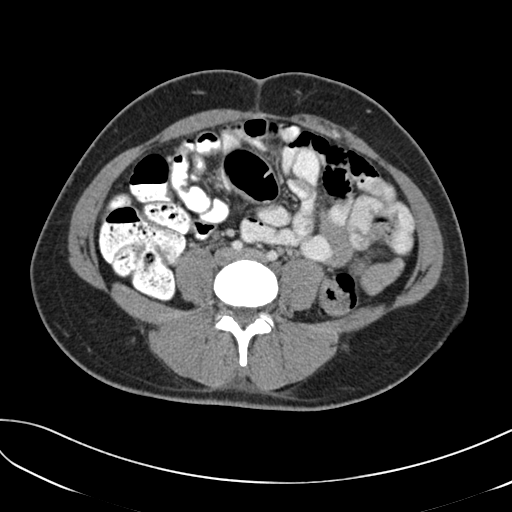
[im 74/136  soft-tissue]
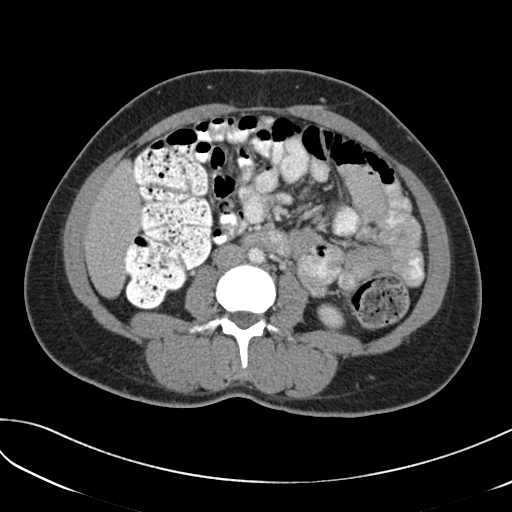
[im 85/136  soft-tissue]
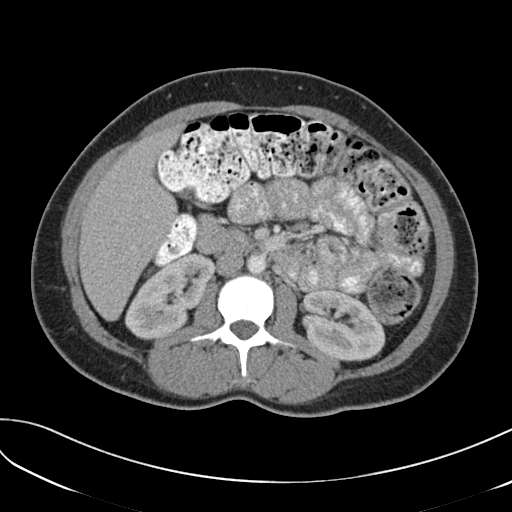
[im 96/136  soft-tissue]
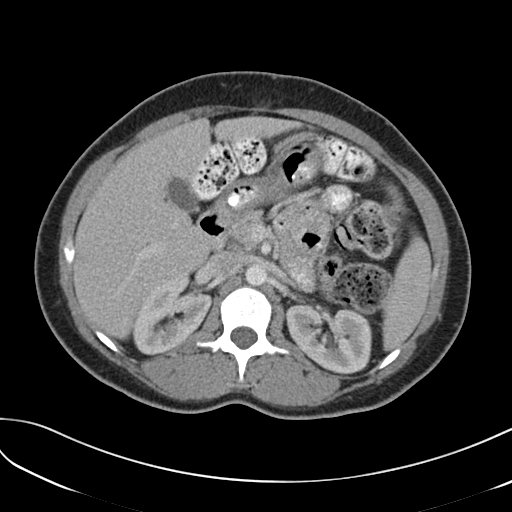
[im 96/136  bone]
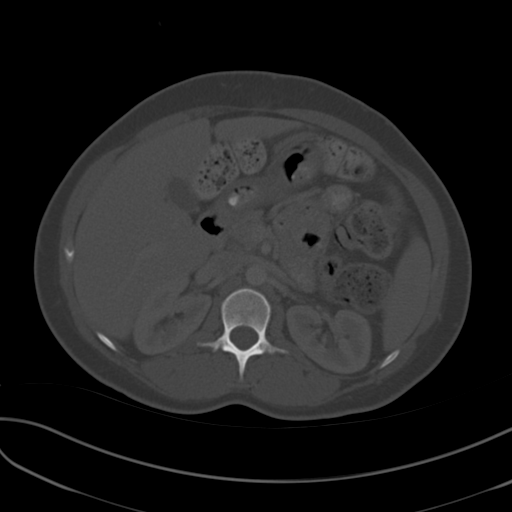
[im 107/136  soft-tissue]
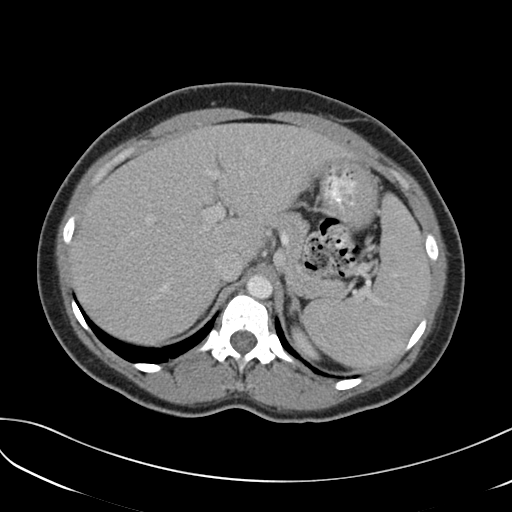
[im 113/136  lung]
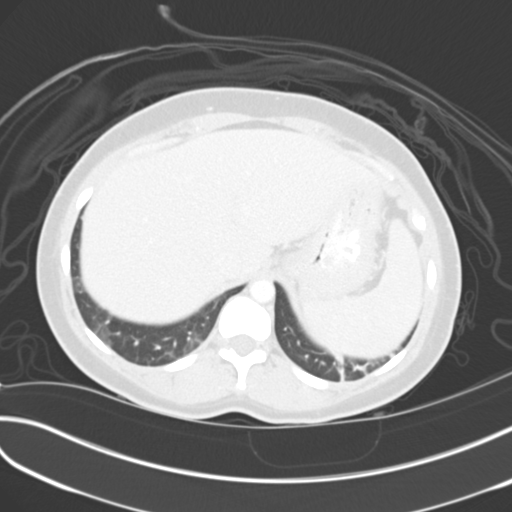
[im 119/136  soft-tissue]
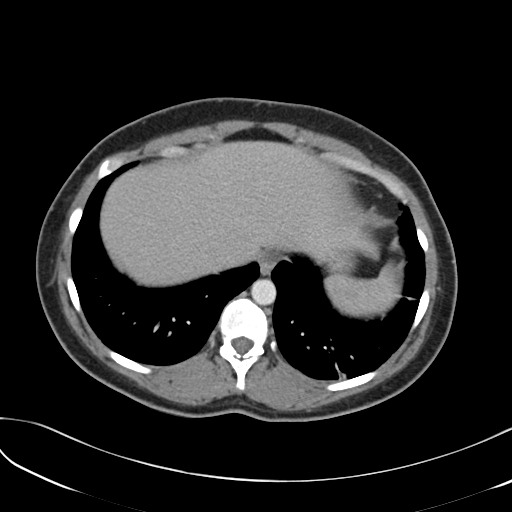
[im 119/136  lung]
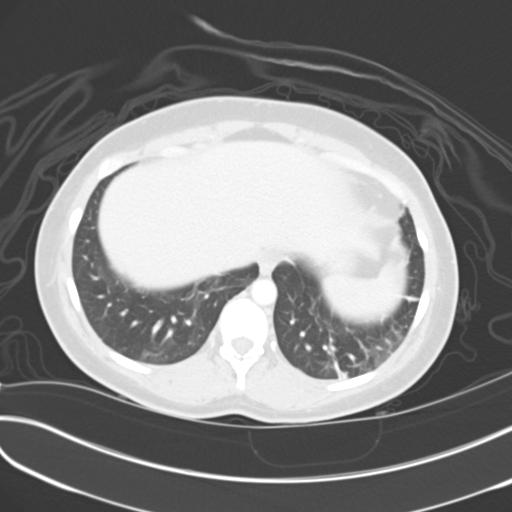
[im 124/136  lung]
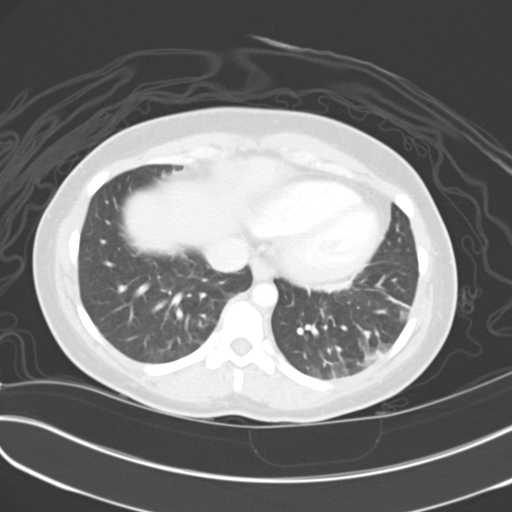
[im 130/136  soft-tissue]
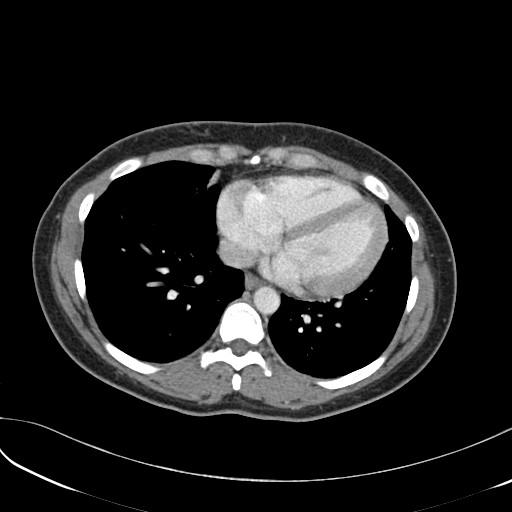
[im 130/136  lung]
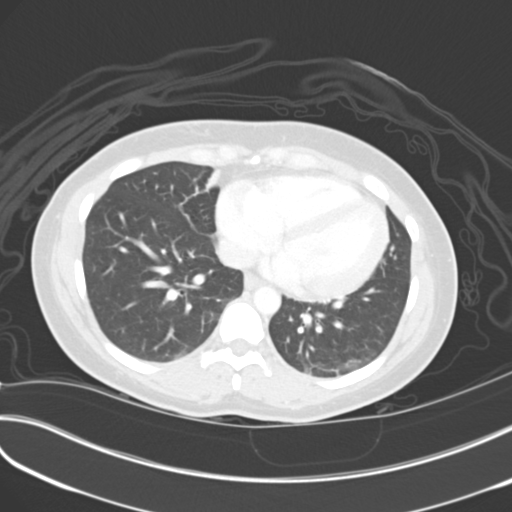

[14 of 32 positions shown; findings below may reference images not displayed]

PROCEDURE:     CT  - CT ABDOMEN / PELVIS  W  - August 01, 2012 [DATE]

RESULT:     Axial CT scanning was performed through the abdomen and pelvis
with reconstructions at 3 mm intervals and slice thicknesses. Review of
multiplanar reconstructed images was performed separately on the VIA
monitor. The patient received 100 cc of Isovue 3 100 intravenously but did
not receive oral contrast material.

A metallic structures present in the umbilicus. There is a tiny
fat-containing umbilical hernia but there is no bowel involved with it. A
tiny nodule seen on image 69 along the peritoneal surface of the hernia
measures just under 1 cm in diameter and is nonspecific. There is no
evidence of an abdominal wall hernia or mass elsewhere. There are a few
normal sized inguinal lymph nodes bilaterally.

The liver, gallbladder, pancreas, spleen, partially distended stomach,
adrenal glands, and kidneys are normal in appearance. The caliber of the
abdominal aorta is normal. The periaortic and pericaval regions are normal
in appearance.

A normal calibered gas filled appendix is demonstrated on images 79 through
90. The partially contrast-filled loops of small and large bowel exhibit no
evidence of ileus or structure. The stool volume is increased in the colon
suggesting constipation. The partially distended urinary bladder is normal
in appearance. There is a tampon within the normal appearing uterus. No
suspicious adnexal masses are demonstrated. There is no free pelvic fluid.

The lung bases exhibit mild atelectatic change in the costophrenic gutter on
the left. The lumbar vertebral bodies are preserved in height.
IMPRESSION: 1. There is a tiny umbilical hernia containing only fat. Along its inner
peritoneal surface there is a soft tissue density nodule measuring just
under 1 cm in diameter demonstrated on image 69. This is does not appear
inflamed.
2. The abdominal visceral structures exhibit no acute abnormality. The
spleen is borderline enlarged at 12 cm.
3. There is a large amount of stool present suggesting constipation. There
is no evidence of bowel obstruction or inflammation.
4. There are atelectatic changes in the left posterior costophrenic angle
and the medial aspect of the right middle lobe.

[REDACTED]

## 2014-04-07 ENCOUNTER — Emergency Department: Payer: Self-pay | Admitting: Internal Medicine

## 2014-04-07 LAB — URINALYSIS, COMPLETE
Bilirubin,UR: NEGATIVE
Blood: NEGATIVE
GLUCOSE, UR: NEGATIVE mg/dL (ref 0–75)
Ketone: NEGATIVE
Leukocyte Esterase: NEGATIVE
Nitrite: NEGATIVE
PH: 7 (ref 4.5–8.0)
Protein: NEGATIVE
SPECIFIC GRAVITY: 1.011 (ref 1.003–1.030)
Squamous Epithelial: 6
WBC UR: 2 /HPF (ref 0–5)

## 2014-04-07 LAB — COMPREHENSIVE METABOLIC PANEL
ALT: 11 U/L — AB
AST: 10 U/L — AB (ref 15–37)
Albumin: 4 g/dL (ref 3.4–5.0)
Alkaline Phosphatase: 68 U/L
Anion Gap: 4 — ABNORMAL LOW (ref 7–16)
BUN: 11 mg/dL (ref 7–18)
Bilirubin,Total: 0.3 mg/dL (ref 0.2–1.0)
CALCIUM: 8.7 mg/dL (ref 8.5–10.1)
CHLORIDE: 106 mmol/L (ref 98–107)
CREATININE: 0.86 mg/dL (ref 0.60–1.30)
Co2: 28 mmol/L (ref 21–32)
EGFR (African American): >60
GLUCOSE: 99 mg/dL (ref 65–99)
OSMOLALITY: 275 (ref 275–301)
POTASSIUM: 3.8 mmol/L (ref 3.5–5.1)
SODIUM: 138 mmol/L (ref 136–145)
TOTAL PROTEIN: 7.3 g/dL (ref 6.4–8.2)

## 2014-04-07 LAB — CBC
HCT: 43.6 % (ref 35.0–47.0)
HGB: 13.8 g/dL (ref 12.0–16.0)
MCH: 29.8 pg (ref 26.0–34.0)
MCHC: 31.7 g/dL — AB (ref 32.0–36.0)
MCV: 94 fL (ref 80–100)
Platelet: 181 10*3/uL (ref 150–440)
RBC: 4.64 10*6/uL (ref 3.80–5.20)
RDW: 13 % (ref 11.5–14.5)
WBC: 7 10*3/uL (ref 3.6–11.0)

## 2014-04-07 LAB — LIPASE, BLOOD: LIPASE: 90 U/L (ref 73–393)

## 2014-06-07 ENCOUNTER — Ambulatory Visit: Payer: Self-pay | Admitting: Family Medicine

## 2015-04-04 ENCOUNTER — Encounter: Payer: Self-pay | Admitting: *Deleted

## 2015-04-18 ENCOUNTER — Ambulatory Visit: Payer: Medicaid Other | Admitting: General Surgery

## 2015-04-30 ENCOUNTER — Ambulatory Visit: Payer: Medicaid Other | Admitting: General Surgery

## 2015-07-03 ENCOUNTER — Encounter: Payer: Self-pay | Admitting: *Deleted

## 2015-08-13 ENCOUNTER — Ambulatory Visit: Payer: Medicaid Other | Admitting: General Surgery

## 2015-08-23 ENCOUNTER — Ambulatory Visit (INDEPENDENT_AMBULATORY_CARE_PROVIDER_SITE_OTHER): Payer: Medicaid Other | Admitting: General Surgery

## 2015-08-23 ENCOUNTER — Telehealth: Payer: Self-pay | Admitting: *Deleted

## 2015-08-23 ENCOUNTER — Encounter: Payer: Self-pay | Admitting: General Surgery

## 2015-08-23 VITALS — BP 136/78 | HR 76 | Resp 14 | Ht <= 58 in | Wt 143.0 lb

## 2015-08-23 DIAGNOSIS — K439 Ventral hernia without obstruction or gangrene: Secondary | ICD-10-CM | POA: Diagnosis not present

## 2015-08-23 NOTE — Telephone Encounter (Signed)
Patient called the office back to move surgery from 09-07-15 to 09-03-15 at [facility] due to transportation issues.

## 2015-08-23 NOTE — Patient Instructions (Addendum)
The patient is aware to call back for any questions or concerns. Hernia, Adult A hernia is the bulging of an organ or tissue through a weak spot in the muscles of the abdomen (abdominal wall). Hernias develop most often near the navel or groin. There are many kinds of hernias. Common kinds include:  Femoral hernia. This kind of hernia develops under the groin in the upper thigh area.  Inguinal hernia. This kind of hernia develops in the groin or scrotum.  Umbilical hernia. This kind of hernia develops near the navel.  Hiatal hernia. This kind of hernia causes part of the stomach to be pushed up into the chest.  Incisional hernia. This kind of hernia bulges through a scar from an abdominal surgery. CAUSES This condition may be caused by:  Heavy lifting.  Coughing over a long period of time.  Straining to have a bowel movement.  An incision made during an abdominal surgery.  A birth defect (congenital defect).  Excess weight or obesity.  Smoking.  Poor nutrition.  Cystic fibrosis.  Excess fluid in the abdomen.  Undescended testicles. SYMPTOMS Symptoms of a hernia include:  A lump on the abdomen. This is the first sign of a hernia. The lump may become more obvious with standing, straining, or coughing. It may get bigger over time if it is not treated or if the condition causing it is not treated.  Pain. A hernia is usually painless, but it may become painful over time if treatment is delayed. The pain is usually dull and may get worse with standing or lifting heavy objects. Sometimes a hernia gets tightly squeezed in the weak spot (strangulated) or stuck there (incarcerated) and causes additional symptoms. These symptoms may include:  Vomiting.  Nausea.  Constipation.  Irritability. DIAGNOSIS A hernia may be diagnosed with:  A physical exam. During the exam your health care provider may ask you to cough or to make a specific movement, because a hernia is usually  more visible when you move.  Imaging tests. These can include:  X-rays.  Ultrasound.  CT scan. TREATMENT A hernia that is small and painless may not need to be treated. A hernia that is large or painful may be treated with surgery. Inguinal hernias may be treated with surgery to prevent incarceration or strangulation. Strangulated hernias are always treated with surgery, because lack of blood to the trapped organ or tissue can cause it to die. Surgery to treat a hernia involves pushing the bulge back into place and repairing the weak part of the abdomen. HOME CARE INSTRUCTIONS  Avoid straining.  Do not lift anything heavier than 10 lb (4.5 kg).  Lift with your leg muscles, not your back muscles. This helps avoid strain.  When coughing, try to cough gently.  Prevent constipation. Constipation leads to straining with bowel movements, which can make a hernia worse or cause a hernia repair to break down. You can prevent constipation by:  Eating a high-fiber diet that includes plenty of fruits and vegetables.  Drinking enough fluids to keep your urine clear or pale yellow. Aim to drink 6-8 glasses of water per day.  Using a stool softener as directed by your health care provider.  Lose weight, if you are overweight.  Do not use any tobacco products, including cigarettes, chewing tobacco, or electronic cigarettes. If you need help quitting, ask your health care provider.  Keep all follow-up visits as directed by your health care provider. This is important. Your health care provider may   need to monitor your condition. SEEK MEDICAL CARE IF:  You have swelling, redness, and pain in the affected area.  Your bowel habits change. SEEK IMMEDIATE MEDICAL CARE IF:  You have a fever.  You have abdominal pain that is getting worse.  You feel nauseous or you vomit.  You cannot push the hernia back in place by gently pressing on it while you are lying down.  The hernia:  Changes in  shape or size.  Is stuck outside the abdomen.  Becomes discolored.  Feels hard or tender.   This information is not intended to replace advice given to you by your health care provider. Make sure you discuss any questions you have with your health care provider.   Document Released: 07/07/2005 Document Revised: 07/28/2014 Document Reviewed: 05/17/2014 Elsevier Interactive Patient Education 2016 [employer].  Patient's surgery has been scheduled for 09-07-15 at [facility].

## 2015-08-23 NOTE — Progress Notes (Signed)
Patient ID: Karen [name], female   DOB: 03/11/1981, 35 y.o.   MRN: [id number]  Chief Complaint  Patient presents with  . Hernia    HPI Karen AMEY is a 35 y.o. female.  Here today for evaluation of an abdominal knot. She states it has been there about 1-2 years. She states she did get it evaluated by [facility] ED on 04-07-14 and had an abdominal CT scan. Then [facility] last year told her it was a hernia. She states she does have some discomfort "soreness" and some days are worse than others. She states activity can make it worse. Bowels are regular and every other day. Occasionally she will have some bleeding from her internal hemorrhoids. She does go to Pain Management in [place].  I personally reviewed the patient's history.  HPI  Past Medical History  Diagnosis Date  . Allergy   . Short of breath on exertion   . Internal hemorrhoids     Past Surgical History  Procedure Laterality Date  . Carpal tunnel release Bilateral 2010  . Cesarean section  2009  . Tonsillectomy  2010  . Hip surgery  2013  . Colonoscopy    . Ovarian cyst surgery  2002    [facility] [place]    Family History  Problem Relation Age of Onset  . COPD Mother   . COPD Father     Social History Social History  Substance Use Topics  . Smoking status: Former Smoker -- 18 years    Quit date: 07/21/2014  . Smokeless tobacco: None  . Alcohol Use: No    Allergies  Allergen Reactions  . Benadryl [Diphenhydramine Hcl] Other (See Comments)    Leg jumpy  . Cyclobenzaprine     Other reaction(s): "legs jumpy"  . Motrin [Ibuprofen] Hives  . Ketorolac Tromethamine Rash  . Tramadol Rash    Current Outpatient Prescriptions  Medication Sig Dispense Refill  . budesonide-formoterol (SYMBICORT) 160-4.5 MCG/ACT inhaler twice a day    . Buprenorphine HCl-Naloxone HCl (SUBOXONE) 8-2 MG FILM as needed    . cetirizine (ZYRTEC) 10 MG tablet Take 10 mg by mouth daily.  2  . fluticasone (FLONASE) 50 MCG/ACT nasal  spray USE 2 SPRAYS DAILY AS DIRECTED  2  . medroxyPROGESTERone (DEPO-PROVERA) 150 MG/ML injection INJECT AS DIRECTED INTRAMUSCULARLY EVERY 3 MONTHS  1  . pantoprazole (PROTONIX) 40 MG tablet Take 40 mg by mouth every morning.  1  . PROVENTIL HFA 108 (90 Base) MCG/ACT inhaler INHALE 1 PUFF BY MOUTH 4 TIMES A DAY AS NEEDED  2   No current facility-administered medications for this visit.    Review of Systems Review of Systems  Constitutional: Negative.   Respiratory: Negative.   Cardiovascular: Negative.     Blood pressure 136/78, pulse 76, resp. rate 14, height 4\' 6"  (1.372 m), weight 143 lb (64.864 kg).  Physical Exam Physical Exam  Constitutional: She is oriented to person, place, and time. She appears well-developed and well-nourished.  HENT:  Mouth/Throat: Oropharynx is clear and moist.  Eyes: Conjunctivae are normal. No scleral icterus.  Neck: Neck supple.  Cardiovascular: Normal rate, regular rhythm and normal heart sounds.   Pulmonary/Chest: Effort normal and breath sounds normal.  Abdominal: Soft. Normal appearance and bowel sounds are normal.    Lymphadenopathy:    She has no cervical adenopathy.  Neurological: She is alert and oriented to person, place, and time.  Skin: Skin is warm and dry.  Psychiatric: Her behavior is normal.  Data Reviewed CT scan of the abdomen and pelvis completed 04/07/2014 was reviewed. The exam was originally completed for abdominal pain. No acute abdominal pathology noted. Bilateral osteoarthritis. 53 m cystic structure adjacent to the dome of the bladder.  Review of the films shows a fascial defect 8 cm above the level of the umbilicus, 5 x 9 mm in diameter with protruding fat representing an epigastric hernia.  CT scan of the abdomen and pelvis dated 03/29/2013 showed minimal chronic changes in left lung base and right middle lobe. These films were not available for review.  PCP notes of 04/03/2015 reviewed. Multiple chronic medical  issues addressed.  Assessment    Symptomatic epigastric hernia.    Plan    The patient is a candidate for repair based on her persistent pain in this area. She is successfully making use of Suboxone for previous narcotic withdrawal. I anticipate that she will have a small incision and minimal postoperative pain, especially if Ex[arel is used at the time of surgery. She will be following up with her pain management provider later this month. His recommendations regarding continuation/discontinuation of Suboxone will be helpful.      I have recommended repair on an outpatient basis in the near future. The risk of infection was reviewed. I do not anticipate that prosthetic mesh will be required.   Patient's surgery has been scheduled for 09-07-15 at [facility].  PCP:  [place], [facility] [place]    This information has been scribed by Karie Fetch RNBC.   Karen [name] 08/24/2015, 10:15 AM

## 2015-08-24 DIAGNOSIS — K439 Ventral hernia without obstruction or gangrene: Secondary | ICD-10-CM | POA: Insufficient documentation

## 2015-08-24 NOTE — H&P (Signed)
HPI  Karen [name] is a 35 y.o. female. Here today for evaluation of an abdominal knot. She states it has been there about 1-2 years. She states she did get it evaluated by [facility] ED on 04-07-14 and had an abdominal CT scan. Then [facility] last year told her it was a hernia.  She states she does have some discomfort "soreness" and some days are worse than others. She states activity can make it worse. Bowels are regular and every other day. Occasionally she will have some bleeding from her internal hemorrhoids.  She does go to Pain Management in [place].  I personally reviewed the patient's history.  HPI  Past Medical History   Diagnosis  Date   .  Allergy    .  Short of breath on exertion    .  Internal hemorrhoids     Past Surgical History   Procedure  Laterality  Date   .  Carpal tunnel release  Bilateral  2010   .  Cesarean section   2009   .  Tonsillectomy   2010   .  Hip surgery   2013   .  Colonoscopy     .  Ovarian cyst surgery   2002     [facility] [place]    Family History   Problem  Relation  Age of Onset   .  COPD  Mother    .  COPD  Father     Social History  Social History   Substance Use Topics   .  Smoking status:  Former Smoker -- 18 years     Quit date:  07/21/2014   .  Smokeless tobacco:  None   .  Alcohol Use:  No    Allergies   Allergen  Reactions   .  Benadryl [Diphenhydramine Hcl]  Other (See Comments)     Leg jumpy   .  Cyclobenzaprine      Other reaction(s): "legs jumpy"   .  Motrin [Ibuprofen]  Hives   .  Ketorolac Tromethamine  Rash   .  Tramadol  Rash    Current Outpatient Prescriptions   Medication  Sig  Dispense  Refill   .  budesonide-formoterol (SYMBICORT) 160-4.5 MCG/ACT inhaler  twice a day     .  Buprenorphine HCl-Naloxone HCl (SUBOXONE) 8-2 MG FILM  as needed     .  cetirizine (ZYRTEC) 10 MG tablet  Take 10 mg by mouth daily.   2   .  fluticasone (FLONASE) 50 MCG/ACT nasal spray  USE 2 SPRAYS DAILY AS DIRECTED   2   .   medroxyPROGESTERone (DEPO-PROVERA) 150 MG/ML injection  INJECT AS DIRECTED INTRAMUSCULARLY EVERY 3 MONTHS   1   .  pantoprazole (PROTONIX) 40 MG tablet  Take 40 mg by mouth every morning.   1   .  PROVENTIL HFA 108 (90 Base) MCG/ACT inhaler  INHALE 1 PUFF BY MOUTH 4 TIMES A DAY AS NEEDED   2    No current facility-administered medications for this visit.    Review of Systems  Review of Systems  Constitutional: Negative.  Respiratory: Negative.  Cardiovascular: Negative.   Blood pressure 136/78, pulse 76, resp. rate 14, height 4\' 6"  (1.372 m), weight 143 lb (64.864 kg).  Physical Exam  Physical Exam  Constitutional: She is oriented to person, place, and time. She appears well-developed and well-nourished.  HENT:  Mouth/Throat: Oropharynx is clear and moist.  Eyes: Conjunctivae are normal. No scleral  icterus.  Neck: Neck supple.  Cardiovascular: Normal rate, regular rhythm and normal heart sounds.  Pulmonary/Chest: Effort normal and breath sounds normal.  Abdominal: Soft. Normal appearance and bowel sounds are normal.    Lymphadenopathy:  She has no cervical adenopathy.  Neurological: She is alert and oriented to person, place, and time.  Skin: Skin is warm and dry.  Psychiatric: Her behavior is normal.   Data Reviewed  CT scan of the abdomen and pelvis completed 04/07/2014 was reviewed. The exam was originally completed for abdominal pain. No acute abdominal pathology noted. Bilateral osteoarthritis. 41 m cystic structure adjacent to the dome of the bladder.  Review of the films shows a fascial defect 8 cm above the level of the umbilicus, 5 x 9 mm in diameter with protruding fat representing an epigastric hernia.  CT scan of the abdomen and pelvis dated 03/29/2013 showed minimal chronic changes in left lung base and right middle lobe. These films were not available for review.  PCP notes of 04/03/2015 reviewed. Multiple chronic medical issues addressed.  Assessment   Symptomatic  epigastric hernia.   Plan   The patient is a candidate for repair based on her persistent pain in this area. She is successfully making use of Suboxone for previous narcotic withdrawal. I anticipate that she will have a small incision and minimal postoperative pain, especially if Ex[arel is used at the time of surgery. She will be following up with her pain management provider later this month. His recommendations regarding continuation/discontinuation of Suboxone will be helpful.   I have recommended repair on an outpatient basis in the near future. The risk of infection was reviewed. I do not anticipate that prosthetic mesh will be required.  Patient's surgery has been scheduled for 09-07-15 at [facility].  PCP: [place], [place]  [facility] [place]  This information has been scribed by Karie Fetch RNBC.  Robert Bellow  08/24/2015, 10:15 AM

## 2015-08-27 ENCOUNTER — Ambulatory Visit: Payer: Medicaid Other | Admitting: General Surgery

## 2015-08-29 ENCOUNTER — Encounter: Payer: Self-pay | Admitting: *Deleted

## 2015-08-29 ENCOUNTER — Other Ambulatory Visit: Payer: Self-pay

## 2015-08-29 NOTE — Patient Instructions (Signed)
  Your procedure is scheduled on: 09-03-15 Report to [facility] To find out your arrival time please call [phone] between 1PM - 3PM on 08-31-15  Remember: Instructions that are not followed completely may result in serious medical risk, up to and including death, or upon the discretion of your surgeon and anesthesiologist your surgery may need to be rescheduled.    _X___ 1. Do not eat food or drink liquids after midnight. No gum chewing or hard candies.     _X___ 2. No Alcohol for 24 hours before or after surgery.   ____ 3. Bring all medications with you on the day of surgery if instructed.    ____ 4. Notify your doctor if there is any change in your medical condition     (cold, fever, infections).     Do not wear jewelry, make-up, hairpins, clips or nail polish.  Do not wear lotions, powders, or perfumes. You may wear deodorant.  Do not shave 48 hours prior to surgery. Men may shave face and neck.  Do not bring valuables to the hospital.    [facility] is not responsible for any belongings or valuables.               Contacts, dentures or bridgework may not be worn into surgery.  Leave your suitcase in the car. After surgery it may be brought to your room.  For patients admitted to the hospital, discharge time is determined by your  treatment team.   Patients discharged the day of surgery will not be allowed to drive home.   Please read over the following fact sheets that you were given:     _X___ Take these medicines the morning of surgery with A SIP OF WATER:    1. PROTONIX  2. TAKE AN EXTRA PROTONIX Sunday NIGHT  3.   4.  5.  6.  ____ Fleet Enema (as directed)   ____ Use CHG Soap as directed  ____ Use inhalers on the day of surgery  ____ Stop metformin 2 days prior to surgery    ____ Take 1/2 of usual insulin dose the night before surgery and none on the morning of surgery.   ____ Stop Coumadin/Plavix/aspirin-N/A  ____ Stop  Anti-inflammatories   ____ Stop supplements until after surgery.    ____ Bring C-Pap to the hospital.

## 2015-08-29 NOTE — Pre-Procedure Instructions (Signed)
PT IS SEEN AT THE PAIN CLINIC FOR HER SUBOXONE DOSING-PT STATES THAT SHE IS TO SEE HER PAIN DOCTOR ON Friday 2-10 AND THAT SHE WILL HAVE TO STOP THE SUBOXONE 3 DAYS PRIOR TO SURGERY

## 2015-08-31 ENCOUNTER — Other Ambulatory Visit: Payer: Self-pay

## 2015-09-03 ENCOUNTER — Ambulatory Visit: Payer: Medicaid Other | Admitting: Certified Registered Nurse Anesthetist

## 2015-09-03 ENCOUNTER — Encounter
Admission: RE | Disposition: A | Discharge status: Home / Self Care | Payer: Self-pay | Source: Ambulatory Visit | Attending: General Surgery

## 2015-09-03 ENCOUNTER — Ambulatory Visit
Admission: RE | Admit: 2015-09-03 | Discharge: 2015-09-03 | Disposition: A | Discharge status: Home / Self Care | Payer: Medicaid Other | Source: Ambulatory Visit | Attending: General Surgery | Admitting: General Surgery

## 2015-09-03 DIAGNOSIS — K648 Other hemorrhoids: Secondary | ICD-10-CM | POA: Insufficient documentation

## 2015-09-03 DIAGNOSIS — R0602 Shortness of breath: Secondary | ICD-10-CM | POA: Diagnosis not present

## 2015-09-03 DIAGNOSIS — E669 Obesity, unspecified: Secondary | ICD-10-CM | POA: Diagnosis not present

## 2015-09-03 DIAGNOSIS — Z888 Allergy status to other drugs, medicaments and biological substances status: Secondary | ICD-10-CM | POA: Insufficient documentation

## 2015-09-03 DIAGNOSIS — K219 Gastro-esophageal reflux disease without esophagitis: Secondary | ICD-10-CM | POA: Insufficient documentation

## 2015-09-03 DIAGNOSIS — F419 Anxiety disorder, unspecified: Secondary | ICD-10-CM | POA: Diagnosis not present

## 2015-09-03 DIAGNOSIS — Z79899 Other long term (current) drug therapy: Secondary | ICD-10-CM | POA: Diagnosis not present

## 2015-09-03 DIAGNOSIS — Z87891 Personal history of nicotine dependence: Secondary | ICD-10-CM | POA: Diagnosis not present

## 2015-09-03 DIAGNOSIS — Z825 Family history of asthma and other chronic lower respiratory diseases: Secondary | ICD-10-CM | POA: Diagnosis not present

## 2015-09-03 DIAGNOSIS — K439 Ventral hernia without obstruction or gangrene: Secondary | ICD-10-CM | POA: Diagnosis not present

## 2015-09-03 DIAGNOSIS — Z6834 Body mass index (BMI) 34.0-34.9, adult: Secondary | ICD-10-CM | POA: Diagnosis not present

## 2015-09-03 HISTORY — DX: Anxiety disorder, unspecified: F41.9

## 2015-09-03 HISTORY — PX: EPIGASTRIC HERNIA REPAIR: SHX404

## 2015-09-03 HISTORY — DX: Gastro-esophageal reflux disease without esophagitis: K21.9

## 2015-09-03 LAB — POCT PREGNANCY, URINE: Preg Test, Ur: NEGATIVE

## 2015-09-03 SURGERY — REPAIR, HERNIA, EPIGASTRIC, ADULT
Anesthesia: General | Wound class: Clean

## 2015-09-03 MED ORDER — KETAMINE HCL 10 MG/ML IJ SOLN
INTRAMUSCULAR | Status: DC | PRN
  Administered 2015-09-03: 30 mg via INTRAVENOUS

## 2015-09-03 MED ORDER — BUPIVACAINE LIPOSOME 1.3 % IJ SUSP
INTRAMUSCULAR | Status: AC
  Filled 2015-09-03: qty 20

## 2015-09-03 MED ORDER — ACETAMINOPHEN 10 MG/ML IV SOLN
INTRAVENOUS | Status: AC
  Filled 2015-09-03: qty 100

## 2015-09-03 MED ORDER — BUPIVACAINE HCL (PF) 0.5 % IJ SOLN
INTRAMUSCULAR | Status: AC
  Filled 2015-09-03: qty 30

## 2015-09-03 MED ORDER — FENTANYL CITRATE (PF) 100 MCG/2ML IJ SOLN
INTRAMUSCULAR | Status: AC
  Administered 2015-09-03: 25 ug via INTRAVENOUS
  Filled 2015-09-03: qty 2

## 2015-09-03 MED ORDER — ACETAMINOPHEN 10 MG/ML IV SOLN
INTRAVENOUS | Status: DC | PRN
  Administered 2015-09-03: 1000 mg via INTRAVENOUS

## 2015-09-03 MED ORDER — DEXAMETHASONE SODIUM PHOSPHATE 4 MG/ML IJ SOLN
INTRAMUSCULAR | Status: DC | PRN
  Administered 2015-09-03: 5 mg via INTRAVENOUS

## 2015-09-03 MED ORDER — OXYCODONE-ACETAMINOPHEN 5-325 MG PO TABS
ORAL_TABLET | ORAL | Status: AC
  Administered 2015-09-03: 1 via ORAL
  Filled 2015-09-03: qty 1

## 2015-09-03 MED ORDER — MIDAZOLAM HCL 2 MG/2ML IJ SOLN
INTRAMUSCULAR | Status: DC | PRN
  Administered 2015-09-03: 2 mg via INTRAVENOUS

## 2015-09-03 MED ORDER — LIDOCAINE HCL (CARDIAC) 20 MG/ML IV SOLN
INTRAVENOUS | Status: DC | PRN
  Administered 2015-09-03: 100 mg via INTRAVENOUS

## 2015-09-03 MED ORDER — BUPIVACAINE LIPOSOME 1.3 % IJ SUSP
INTRAMUSCULAR | Status: DC | PRN
  Administered 2015-09-03: 20 mL

## 2015-09-03 MED ORDER — OXYCODONE-ACETAMINOPHEN 5-325 MG PO TABS
1.0000 | ORAL_TABLET | Freq: Once | ORAL | Status: AC
  Administered 2015-09-03: 1 via ORAL

## 2015-09-03 MED ORDER — ONDANSETRON HCL 4 MG/2ML IJ SOLN
INTRAMUSCULAR | Status: DC | PRN
  Administered 2015-09-03: 4 mg via INTRAVENOUS

## 2015-09-03 MED ORDER — ONDANSETRON HCL 4 MG/2ML IJ SOLN: 4.0000 mg | Freq: Once | INTRAMUSCULAR | Status: DC | PRN

## 2015-09-03 MED ORDER — LACTATED RINGERS IV SOLN
INTRAVENOUS | Status: DC
  Administered 2015-09-03: 06:00:00 via INTRAVENOUS

## 2015-09-03 MED ORDER — PROPOFOL 10 MG/ML IV BOLUS
INTRAVENOUS | Status: DC | PRN
  Administered 2015-09-03: 130 mg via INTRAVENOUS

## 2015-09-03 MED ORDER — OXYCODONE-ACETAMINOPHEN 5-325 MG PO TABS
1.0000 | ORAL_TABLET | ORAL | Status: DC | PRN
Start: 2015-09-03 — End: 2016-01-03

## 2015-09-03 MED ORDER — FENTANYL CITRATE (PF) 100 MCG/2ML IJ SOLN
25.0000 ug | INTRAMUSCULAR | Status: DC | PRN
  Administered 2015-09-03 (×4): 25 ug via INTRAVENOUS

## 2015-09-03 MED ORDER — FENTANYL CITRATE (PF) 100 MCG/2ML IJ SOLN
INTRAMUSCULAR | Status: DC | PRN
  Administered 2015-09-03: 100 ug via INTRAVENOUS
  Administered 2015-09-03 (×6): 50 ug via INTRAVENOUS

## 2015-09-03 SURGICAL SUPPLY — 34 items
BENZOIN TINCTURE PRP APPL 2/3 (GAUZE/BANDAGES/DRESSINGS) ×3 IMPLANT
BLADE SURG 15 STRL SS SAFETY (BLADE) ×3 IMPLANT
CANISTER SUCT 1200ML W/VALVE (MISCELLANEOUS) ×6 IMPLANT
CHLORAPREP W/TINT 26ML (MISCELLANEOUS) ×3 IMPLANT
CLOSURE WOUND 1/2 X4 (GAUZE/BANDAGES/DRESSINGS) ×1
DRAPE LAPAROTOMY 100X77 ABD (DRAPES) ×3 IMPLANT
DRESSING TELFA 4X3 1S ST N-ADH (GAUZE/BANDAGES/DRESSINGS) ×3 IMPLANT
DRSG TEGADERM 4X4.75 (GAUZE/BANDAGES/DRESSINGS) ×3 IMPLANT
ELECT REM PT RETURN 9FT ADLT (ELECTROSURGICAL) ×3
ELECTRODE REM PT RTRN 9FT ADLT (ELECTROSURGICAL) ×1 IMPLANT
GLOVE BIO SURGEON STRL SZ7.5 (GLOVE) ×6 IMPLANT
GLOVE INDICATOR 8.0 STRL GRN (GLOVE) ×6 IMPLANT
GOWN STRL REUS W/ TWL LRG LVL3 (GOWN DISPOSABLE) ×2 IMPLANT
GOWN STRL REUS W/TWL LRG LVL3 (GOWN DISPOSABLE) ×4
LABEL OR SOLS (LABEL) ×3 IMPLANT
NDL SAFETY 22GX1.5 (NEEDLE) ×3 IMPLANT
NEEDLE HYPO 22GX1.5 SAFETY (NEEDLE) ×3 IMPLANT
NEEDLE HYPO 25X1 1.5 SAFETY (NEEDLE) ×3 IMPLANT
NS IRRIG 500ML POUR BTL (IV SOLUTION) ×3 IMPLANT
PACK BASIN MINOR ARMC (MISCELLANEOUS) ×3 IMPLANT
SPONGE LAP 18X18 5 PK (GAUZE/BANDAGES/DRESSINGS) ×3 IMPLANT
STAPLER SKIN PROX 35W (STAPLE) IMPLANT
STRIP CLOSURE SKIN 1/2X4 (GAUZE/BANDAGES/DRESSINGS) ×2 IMPLANT
SUT SURGILON 0 BLK (SUTURE) ×3 IMPLANT
SUT VIC AB 2-0 BRD 54 (SUTURE) ×3 IMPLANT
SUT VIC AB 2-0 CT1 27 (SUTURE) ×2
SUT VIC AB 2-0 CT1 TAPERPNT 27 (SUTURE) ×1 IMPLANT
SUT VIC AB 3-0 SH 27 (SUTURE) ×2
SUT VIC AB 3-0 SH 27X BRD (SUTURE) ×1 IMPLANT
SUT VIC AB 4-0 FS2 27 (SUTURE) ×3 IMPLANT
SUT VICRYL+ 3-0 144IN (SUTURE) ×3 IMPLANT
SWABSTK COMLB BENZOIN TINCTURE (MISCELLANEOUS) ×3 IMPLANT
SYR 3ML LL SCALE MARK (SYRINGE) ×3 IMPLANT
SYR CONTROL 10ML (SYRINGE) ×3 IMPLANT

## 2015-09-03 NOTE — Op Note (Addendum)
Preoperative diagnosis: Epigastric hernia. Postoperative diagnosis: Same.  Operative procedure: Repair of epigastric hernia.  Operating surgeon: Ollen Bowl, M.D.  Anesthesia: Gen. by LMA, Exparel: 22 mL  Estimated blood loss: Less than 5 mL.  Clinical note: This 35 year old woman has had epigastric pain and a previous CT showed a small fascial defect with protrusion of preperitoneal fat. She is admitted for elective repair. She discontinued Suboxone as instructed by her pain management specialist 48 hours ago. She has been instructed by him to resume after she finishes with short acting narcotic needs post surgery.   Operative note:   the patient underwent general anesthesia without difficulty. The abdomen was prepped with ChloraPrep and draped. A transverse incision was made at the site of the previously identified hernia after the installation of long-acting bupivacaine. Skin was incised sharply and the remaining dissection completed with electrocautery. It was necessary to extend the incision slightly to provide exposure as the fascia was 4-5 cm below the skin level. A large mass of fat measuring 2 x 4 x 5 cm was found emanating from a 5 mm fascial defect. This was transected with hemostasis achieved by 3-0 Vicryl ties. The undersurface of the fashion was cleared. The area around the defect was examined for accessory defects and none was appreciated. A total of 3, 0 Surgilon sutures were used to obliterate the defect without difficulty. The adipose tissue was approximated with multiple layers. 2-0 Vicryl sutures were used at the deep layer taking a small bite of the fascia with the adipose tissue to obliterate dead space. The superficial femoral adipose tissue was closed with a running 3-0 Vicryl suture and the skin closed with a running 4-0 Vicryl subcuticular suture. Benzoin, Steri-Strips, Telfa and Tatum dressing applied.  The patient tolerated the procedure well and was taken to the  recovery room in stable condition.   A prescription for Percocet 5/325, #15 with the inscription to use one tablet by mouth every 4 hours when necessary for severe pain was provided.

## 2015-09-03 NOTE — Anesthesia Preprocedure Evaluation (Signed)
Anesthesia Evaluation  Patient identified by MRN, date of birth, ID band Patient awake    Reviewed: Allergy & Precautions, NPO status , Patient's Chart, lab work & pertinent test results  Airway Mallampati: II  TM Distance: >3 FB Neck ROM: Full    Dental  (+) Teeth Intact   Pulmonary shortness of breath and with exertion, former smoker,    Pulmonary exam normal        Cardiovascular Exercise Tolerance: Good Normal cardiovascular exam     Neuro/Psych Anxiety    GI/Hepatic GERD  Medicated and Controlled,  Endo/Other    Renal/GU      Musculoskeletal   Abdominal (+) + obese,  Abdomen: soft.    Peds  Hematology   Anesthesia Other Findings   Reproductive/Obstetrics                             Anesthesia Physical Anesthesia Plan  ASA: II  Anesthesia Plan: General   Post-op Pain Management:    Induction: Intravenous  Airway Management Planned: Oral ETT  Additional Equipment:   Intra-op Plan:   Post-operative Plan: Extubation in OR  Informed Consent: I have reviewed the patients History and Physical, chart, labs and discussed the procedure including the risks, benefits and alternatives for the proposed anesthesia with the patient or authorized representative who has indicated his/her understanding and acceptance.     Plan Discussed with: CRNA  Anesthesia Plan Comments: (On suboxone--held since Saturday. Urine pregnancy test is negative. Mild asthma, controlled.)        Anesthesia Quick Evaluation

## 2015-09-03 NOTE — H&P (Signed)
No change in clinical history. She met with her pain specialist,and has been instructed to resume Suboxone after narcotics for acute pain no longer required.

## 2015-09-03 NOTE — Transfer of Care (Signed)
Immediate Anesthesia Transfer of Care Note  Patient: Karen [name]  Procedure(s) Performed: Procedure(s): HERNIA REPAIR EPIGASTRIC ADULT (N/A)  Patient Location: PACU  Anesthesia Type:General  Level of Consciousness: sedated  Airway & Oxygen Therapy: Patient Spontanous Breathing and Patient connected to nasal cannula oxygen  Post-op Assessment: Report given to RN and Post -op Vital signs reviewed and stable  Post vital signs: Reviewed and stable  Last Vitals:  Filed Vitals:   09/03/15 0607 09/03/15 0823  BP: 83/44 116/72  Pulse: 95 91  Temp: 36.8 C 36.4 C  Resp: 12 14    Complications: No apparent anesthesia complications

## 2015-09-03 NOTE — Discharge Instructions (Signed)

## 2015-09-03 NOTE — Anesthesia Procedure Notes (Signed)
Procedure Name: LMA Insertion Date/Time: 09/03/2015 7:37 AM Performed by: Rosaria Ferries, Maddux Vanscyoc Pre-anesthesia Checklist: Patient identified, Emergency Drugs available, Suction available and Patient being monitored Patient Re-evaluated:Patient Re-evaluated prior to inductionOxygen Delivery Method: Circle system utilized Preoxygenation: Pre-oxygenation with 100% oxygen Intubation Type: IV induction LMA: LMA inserted LMA Size: 3.5 Tube type: Oral Number of attempts: 1 Placement Confirmation: breath sounds checked- equal and bilateral Tube secured with: Tape Dental Injury: Teeth and Oropharynx as per pre-operative assessment

## 2015-09-03 NOTE — Anesthesia Postprocedure Evaluation (Signed)
Anesthesia Post Note  Patient: Karen [name]  Procedure(s) Performed: Procedure(s) (LRB): HERNIA REPAIR EPIGASTRIC ADULT (N/A)  Patient location during evaluation: PACU Anesthesia Type: General Level of consciousness: awake and alert Pain management: pain level controlled Vital Signs Assessment: post-procedure vital signs reviewed and stable Respiratory status: spontaneous breathing Cardiovascular status: blood pressure returned to baseline Postop Assessment: no headache Anesthetic complications: no    Last Vitals:  Filed Vitals:   09/03/15 0908 09/03/15 0918  BP: 110/68 146/95  Pulse: 91   Temp: 36.9 C 36.9 C  Resp: 14 14    Last Pain:  Filed Vitals:   09/03/15 0925  PainSc: 8                  Duston Smolenski M

## 2015-09-04 ENCOUNTER — Encounter: Payer: Self-pay | Admitting: General Surgery

## 2015-09-04 ENCOUNTER — Telehealth: Payer: Self-pay | Admitting: *Deleted

## 2015-09-04 NOTE — Telephone Encounter (Signed)
Phone call asking for additional medication to control pain. May try aleve or advil as supplement as well as ice pack or heat for comfort. He states the the pharmacy will not fill her suboxone since she has the percocet RX, advised to call pain clinic. No additional medication for pain per Dr Bary Castilla.

## 2015-09-10 ENCOUNTER — Ambulatory Visit: Payer: Medicaid Other | Admitting: General Surgery

## 2015-09-12 ENCOUNTER — Telehealth: Payer: Self-pay | Admitting: *Deleted

## 2015-09-12 ENCOUNTER — Ambulatory Visit: Payer: Medicaid Other | Admitting: General Surgery

## 2015-09-12 NOTE — Telephone Encounter (Signed)
-----   Message from Robert Bellow, MD sent at 09/12/2015 11:17 AM EST ----- Patient has missed two appts. See if she is having any problems with the hernia, if not, she does not need to schedule another appt.

## 2015-09-25 NOTE — Telephone Encounter (Signed)
No return call 

## 2015-12-17 ENCOUNTER — Ambulatory Visit
Admission: EM | Admit: 2015-12-17 | Discharge: 2015-12-17 | Disposition: A | Discharge status: Hospice | Payer: Medicaid Other | Attending: Family Medicine | Admitting: Family Medicine

## 2015-12-17 ENCOUNTER — Encounter: Payer: Self-pay | Admitting: Emergency Medicine

## 2015-12-17 ENCOUNTER — Ambulatory Visit: Payer: Medicaid Other

## 2015-12-17 ENCOUNTER — Emergency Department
Admission: EM | Admit: 2015-12-17 | Discharge: 2015-12-17 | Disposition: A | Discharge status: Home / Self Care | Payer: Medicaid Other | Attending: Emergency Medicine | Admitting: Emergency Medicine

## 2015-12-17 DIAGNOSIS — Z7951 Long term (current) use of inhaled steroids: Secondary | ICD-10-CM | POA: Insufficient documentation

## 2015-12-17 DIAGNOSIS — K219 Gastro-esophageal reflux disease without esophagitis: Secondary | ICD-10-CM | POA: Diagnosis not present

## 2015-12-17 DIAGNOSIS — Z87891 Personal history of nicotine dependence: Secondary | ICD-10-CM | POA: Insufficient documentation

## 2015-12-17 DIAGNOSIS — F419 Anxiety disorder, unspecified: Secondary | ICD-10-CM | POA: Diagnosis not present

## 2015-12-17 DIAGNOSIS — J189 Pneumonia, unspecified organism: Secondary | ICD-10-CM | POA: Insufficient documentation

## 2015-12-17 DIAGNOSIS — R062 Wheezing: Secondary | ICD-10-CM | POA: Diagnosis present

## 2015-12-17 DIAGNOSIS — Z79899 Other long term (current) drug therapy: Secondary | ICD-10-CM | POA: Diagnosis not present

## 2015-12-17 DIAGNOSIS — R509 Fever, unspecified: Secondary | ICD-10-CM | POA: Diagnosis present

## 2015-12-17 DIAGNOSIS — R0602 Shortness of breath: Secondary | ICD-10-CM | POA: Diagnosis present

## 2015-12-17 DIAGNOSIS — J45901 Unspecified asthma with (acute) exacerbation: Secondary | ICD-10-CM

## 2015-12-17 LAB — CBC WITH DIFFERENTIAL/PLATELET
Basophils Absolute: 0 10*3/uL (ref 0–0.1)
Basophils Relative: 0 %
Eosinophils Absolute: 0 10*3/uL (ref 0–0.7)
Eosinophils Relative: 1 %
HCT: 34.8 % — ABNORMAL LOW (ref 35.0–47.0)
HEMOGLOBIN: 11.5 g/dL — AB (ref 12.0–16.0)
LYMPHS ABS: 0.7 10*3/uL — AB (ref 1.0–3.6)
LYMPHS PCT: 14 %
MCH: 25.6 pg — AB (ref 26.0–34.0)
MCHC: 33 g/dL (ref 32.0–36.0)
MCV: 77.6 fL — AB (ref 80.0–100.0)
Monocytes Absolute: 0.5 10*3/uL (ref 0.2–0.9)
Monocytes Relative: 10 %
NEUTROS ABS: 3.9 10*3/uL (ref 1.4–6.5)
NEUTROS PCT: 75 %
Platelets: 163 10*3/uL (ref 150–440)
RBC: 4.48 MIL/uL (ref 3.80–5.20)
RDW: 15.4 % — ABNORMAL HIGH (ref 11.5–14.5)
WBC: 5.1 10*3/uL (ref 3.6–11.0)

## 2015-12-17 LAB — COMPREHENSIVE METABOLIC PANEL
ALK PHOS: 124 U/L (ref 38–126)
ALT: 27 U/L (ref 14–54)
AST: 27 U/L (ref 15–41)
Albumin: 3.5 g/dL (ref 3.5–5.0)
Anion gap: 8 (ref 5–15)
BUN: 8 mg/dL (ref 6–20)
CALCIUM: 8.4 mg/dL — AB (ref 8.9–10.3)
CO2: 24 mmol/L (ref 22–32)
CREATININE: 0.45 mg/dL (ref 0.44–1.00)
Chloride: 103 mmol/L (ref 101–111)
Glucose, Bld: 101 mg/dL — ABNORMAL HIGH (ref 65–99)
Potassium: 3.6 mmol/L (ref 3.5–5.1)
SODIUM: 135 mmol/L (ref 135–145)
Total Bilirubin: 0.2 mg/dL — ABNORMAL LOW (ref 0.3–1.2)
Total Protein: 6.9 g/dL (ref 6.5–8.1)

## 2015-12-17 LAB — TROPONIN I: Troponin I: <0.03 ng/mL (ref <0.031)

## 2015-12-17 LAB — RAPID STREP SCREEN (MED CTR MEBANE ONLY): Streptococcus, Group A Screen (Direct): NEGATIVE

## 2015-12-17 MED ORDER — SODIUM CHLORIDE 0.9 % IV BOLUS (SEPSIS)
1000.0000 mL | Freq: Once | INTRAVENOUS | Status: AC
  Administered 2015-12-17: 1000 mL via INTRAVENOUS

## 2015-12-17 MED ORDER — HYDROCOD POLST-CPM POLST ER 10-8 MG/5ML PO SUER
5.0000 mL | Freq: Once | ORAL | Status: AC
  Administered 2015-12-17: 5 mL via ORAL
  Filled 2015-12-17: qty 5

## 2015-12-17 MED ORDER — IPRATROPIUM-ALBUTEROL 0.5-2.5 (3) MG/3ML IN SOLN
3.0000 mL | Freq: Once | RESPIRATORY_TRACT | Status: AC
  Administered 2015-12-17: 3 mL via RESPIRATORY_TRACT

## 2015-12-17 MED ORDER — ACETAMINOPHEN 500 MG PO TABS
ORAL_TABLET | ORAL | Status: AC
  Administered 2015-12-17: 1000 mg via ORAL
  Filled 2015-12-17: qty 2

## 2015-12-17 MED ORDER — GUAIFENESIN-CODEINE 100-10 MG/5ML PO SOLN: 5.0000 mL | Freq: Four times a day (QID) | ORAL | Status: DC | PRN

## 2015-12-17 MED ORDER — ONDANSETRON HCL 4 MG/2ML IJ SOLN
4.0000 mg | Freq: Once | INTRAMUSCULAR | Status: AC
  Administered 2015-12-17: 4 mg via INTRAVENOUS
  Filled 2015-12-17: qty 2

## 2015-12-17 MED ORDER — ONDANSETRON 8 MG PO TBDP
8.0000 mg | ORAL_TABLET | Freq: Once | ORAL | Status: AC
  Administered 2015-12-17: 8 mg via ORAL

## 2015-12-17 MED ORDER — IPRATROPIUM-ALBUTEROL 0.5-2.5 (3) MG/3ML IN SOLN
3.0000 mL | Freq: Once | RESPIRATORY_TRACT | Status: AC
  Administered 2015-12-17: 3 mL via RESPIRATORY_TRACT
  Filled 2015-12-17: qty 3

## 2015-12-17 MED ORDER — SODIUM CHLORIDE 0.9 % IV BOLUS (SEPSIS): 1000.0000 mL | Freq: Once | INTRAVENOUS | Status: AC

## 2015-12-17 MED ORDER — ACETAMINOPHEN 500 MG PO TABS
1000.0000 mg | ORAL_TABLET | Freq: Once | ORAL | Status: AC
  Administered 2015-12-17: 1000 mg via ORAL

## 2015-12-17 MED ORDER — PREDNISONE 20 MG PO TABS: 40.0000 mg | ORAL_TABLET | Freq: Every day | ORAL | Status: DC

## 2015-12-17 MED ORDER — LEVOFLOXACIN IN D5W 750 MG/150ML IV SOLN
750.0000 mg | Freq: Once | INTRAVENOUS | Status: AC
  Administered 2015-12-17: 750 mg via INTRAVENOUS
  Filled 2015-12-17: qty 150

## 2015-12-17 MED ORDER — ALBUTEROL SULFATE HFA 108 (90 BASE) MCG/ACT IN AERS: 2.0000 | INHALATION_SPRAY | Freq: Four times a day (QID) | RESPIRATORY_TRACT | Status: DC | PRN

## 2015-12-17 MED ORDER — LEVOFLOXACIN 750 MG PO TABS: 750.0000 mg | ORAL_TABLET | Freq: Every day | ORAL | Status: AC

## 2015-12-17 NOTE — ED Notes (Signed)
Pt sent from urgent care via EMS, reports worsening SOB since Wednesday, was given 2 duonebs and 125 solu-medrol, xray showed pneumonia at urgent care. Reports fever as well

## 2015-12-17 NOTE — ED Provider Notes (Signed)
[facility] Emergency Department Provider Note  Time seen: 3:25 PM  I have reviewed the triage vital signs and the nursing notes.   HISTORY  Chief Complaint Shortness of Breath    HPI Karen [name] is a 35 y.o. female with a past medical history of anxiety, asthma, presents the emergency department with 5 days of cough and shortness of breath. According to the patient for the past 5 days she has been coughing, minimal sputum production. She has been short of breath with fevers up to 104 at home. Patient also states nausea and vomiting for the past 3 days, states she cannot keep fluids down. States mild chest discomfort worse with coughing, describes as a dull pain. Patient went to urgent care today due to her nausea and vomiting, was diagnosed with a pneumonia via x-ray and sent to the emergency department for further evaluation. Patient states her last episode of vomiting was approximately 2-3 hours ago. Denies any diarrhea. Denies abdominal pain. Patient does feel short of breath with audible wheeze. Patient states he quit smoking 3 years ago. Does have a diagnosis of asthma.     Past Medical History  Diagnosis Date  . Allergy   . Internal hemorrhoids   . GERD (gastroesophageal reflux disease)   . Anxiety   . Short of breath on exertion     PT STATES SHE CAN WALK A MILE AT HER OWN PACE WITH OUT GETTING SOB    Patient Active Problem List   Diagnosis Date Noted  . Epigastric hernia 08/24/2015    Past Surgical History  Procedure Laterality Date  . Carpal tunnel release Bilateral 2010  . Cesarean section  2009  . Tonsillectomy  2010  . Hip surgery  2013  . Colonoscopy    . [facility]  [facility] [place]  . Epigastric hernia repair N/A 09/03/2015    Procedure: HERNIA REPAIR EPIGASTRIC ADULT;  Surgeon: Robert Bellow, MD;  Location: [facility] ORS;  Service: General;  Laterality: N/A;    Current Outpatient Rx  Name  Route  Sig   Dispense  Refill  . budesonide-formoterol (SYMBICORT) 160-4.5 MCG/ACT inhaler      twice a day         . Buprenorphine HCl-Naloxone HCl (SUBOXONE) 8-2 MG FILM      BID         . cetirizine (ZYRTEC) 10 MG tablet   Oral   Take 10 mg by mouth daily.      2   . fluticasone (FLONASE) 50 MCG/ACT nasal spray      USE 2 SPRAYS DAILY AS DIRECTED      2   . medroxyPROGESTERone (DEPO-PROVERA) 150 MG/ML injection      INJECT AS DIRECTED INTRAMUSCULARLY EVERY 3 MONTHS      1   . oxyCODONE-acetaminophen (ROXICET) 5-325 MG tablet   Oral   Take 1 tablet by mouth every 4 (four) hours as needed for severe pain.   15 tablet   0   . pantoprazole (PROTONIX) 40 MG tablet   Oral   Take 40 mg by mouth every morning.      1   . PROVENTIL HFA 108 (90 Base) MCG/ACT inhaler      INHALE 1 PUFF BY MOUTH 4 TIMES A DAY AS NEEDED      2     Dispense as written.     Allergies Benadryl; Cyclobenzaprine; Motrin; Ketorolac tromethamine; and Tramadol  Family  History  Problem Relation Age of Onset  . COPD Mother   . COPD Father     Social History Social History  Substance Use Topics  . Smoking status: Former Smoker -- 0.50 packs/day for 18 years    Types: Cigarettes    Quit date: 07/21/2014  . Smokeless tobacco: None  . Alcohol Use: No    Review of Systems Constitutional: Positive for fever. Cardiovascular: Mild chest discomfort. Respiratory: Moderate shortness of breath with cough. Gastrointestinal: Negative for abdominal pain. Positive for nausea and vomiting. Negative for diarrhea. Genitourinary: Negative for dysuria. Neurological: Negative for headache 10-point ROS otherwise negative.  ____________________________________________   PHYSICAL EXAM:  VITAL SIGNS: ED Triage Vitals  Enc Vitals Group     BP --      Pulse --      Resp --      Temp --      Temp src --      SpO2 --      Weight --      Height --      Head Cir --      Peak Flow --      Pain Score  12/17/15 1506 10     Pain Loc --      Pain Edu? --      Excl. in [place]? --     Constitutional: Alert and oriented. Well appearing and in no distress. Eyes: Normal exam ENT   Head: Normocephalic and atraumatic   Mouth/Throat: Mucous membranes are moist. Cardiovascular: Regular rhythm, rate around 120 bpm. No murmur. Respiratory: Normal respiratory effort with mild tachypnea, moderate expiratory wheeze bilaterally. No obvious rales or rhonchi on exam. Gastrointestinal: Soft and nontender. No distention.  Musculoskeletal: Nontender with normal range of motion in all extremities.  Neurologic:  Normal speech and language. No gross focal neurologic deficits  Skin:  Skin is warm, dry and intact.  Psychiatric: Mood and affect are normal.   ____________________________________________    EKG  EKG reviewed and interpreted by myself shows sinus tachycardia 114 bpm, narrow QRS, normal axis, normal intervals, no ST changes. Overall normal EKG besides tachycardia.  ____________________________________________    RADIOLOGY  Chest x-ray shows a left lingular pneumonia.  ____________________________________________    INITIAL IMPRESSION / ASSESSMENT AND PLAN / ED COURSE  Pertinent labs & imaging results that were available during my care of the patient were reviewed by me and considered in my medical decision making (see chart for details).  The patient presents the emergency department after being diagnosed with a left lingular pneumonia on chest x-ray at urgent care just prior to arrival. Patient is tachycardic at 124 bpm, borderline fever 99.5, tachypnea to 24 breaths per minute. We will dose fluids, DuoNeb's, check blood cultures as well as labs and begin treatment with IV Levaquin. Overall the patient appears well, nontoxic in appearance.  The patient's chest x-ray is consistent with a lingular pneumonia. Patient's labs show normal white blood cell count, negative troponin. Patient  is feeling better after breathing treatments, remained somewhat tachycardic around 105 bpm. 97% O2 saturation on room air. Given the patient's wheeze we will also cover her with steroids in addition to her antibiotics. Patient has albuterol at home, I have instructed her to use every 4-6 hours at home as needed for shortness of breath. Patient is agreeable to this plan. I discussed very strict return precautions with the patient. Patient is agreeable to this plan.   The patient's IV fluids have finished. Patient continues  to appear well. Her heart rate remains mildly tachycardic 100-1 115 bpm, appears to be more consistently in the low 100s. I have instructed the patient to drink plenty of fluids. Take your entire course of antibiotics, use albuterol every 4 hours as needed, and I discussed very strict return precautions. Patient is agreeable.  ____________________________________________   FINAL CLINICAL IMPRESSION(S) / ED DIAGNOSES  Pneumonia   Harvest Dark, MD 12/17/15 1758

## 2015-12-17 NOTE — ED Provider Notes (Addendum)
CSN: [id number]     Arrival date & time 12/17/15  1233 History   First MD Initiated Contact with Patient 12/17/15 1306     Chief Complaint  Patient presents with  . Fever  . Wheezing   (Consider location/radiation/quality/duration/timing/severity/associated sxs/prior Treatment) HPI Comments: 35 yo female with a h/o asthma, mild, persistent (on Symbicort and albuterol at home) presents with a 5 day h/o progressively worsening cough, wheezing and shortness of breath associated with intermittent fevers. States vomited today as well. Denies prior hospitalization for asthma exacerbation.   Patient is a 35 y.o. female presenting with fever and wheezing. The history is provided by the patient.  Fever Wheezing Associated symptoms: fever     Past Medical History  Diagnosis Date  . Allergy   . Internal hemorrhoids   . GERD (gastroesophageal reflux disease)   . Anxiety   . Short of breath on exertion     PT STATES SHE CAN WALK A MILE AT HER OWN PACE WITH OUT GETTING SOB   Past Surgical History  Procedure Laterality Date  . Carpal tunnel release Bilateral 2010  . Cesarean section  2009  . Tonsillectomy  2010  . Hip surgery  2013  . Colonoscopy    . [facility]  [facility] [place]  . Epigastric hernia repair N/A 09/03/2015    Procedure: HERNIA REPAIR EPIGASTRIC ADULT;  Surgeon: Robert Bellow, MD;  Location: [facility] ORS;  Service: General;  Laterality: N/A;   Family History  Problem Relation Age of Onset  . COPD Mother   . COPD Father    Social History  Substance Use Topics  . Smoking status: Former Smoker -- 0.50 packs/day for 18 years    Types: Cigarettes    Quit date: 07/21/2014  . Smokeless tobacco: None  . Alcohol Use: No   OB History    Gravida Para Term Preterm AB TAB SAB Ectopic Multiple Living   1 1              Obstetric Comments   1st Menstrual Cycle:  10  1st Pregnancy:  26      Review of Systems  Constitutional: Positive for fever.   Respiratory: Positive for wheezing.     Allergies  Benadryl; Cyclobenzaprine; Motrin; Ketorolac tromethamine; and Tramadol  Home Medications   Prior to Admission medications   Medication Sig Start Date End Date Taking? Authorizing Provider  budesonide-formoterol [facility]) 160-4.5 MCG/ACT inhaler twice a day 10/31/14   Historical Provider, MD  Buprenorphine HCl-Naloxone HCl (SUBOXONE) 8-2 MG FILM BID 10/31/14   Historical Provider, MD  cetirizine (ZYRTEC) 10 MG tablet Take 10 mg by mouth daily. 08/18/15   Historical Provider, MD  fluticasone (FLONASE) 50 MCG/ACT nasal spray USE 2 SPRAYS DAILY AS DIRECTED 08/18/15   Historical Provider, MD  medroxyPROGESTERone (DEPO-PROVERA) 150 MG/ML injection INJECT AS DIRECTED INTRAMUSCULARLY EVERY 3 MONTHS 07/30/15   Historical Provider, MD  oxyCODONE-acetaminophen (ROXICET) 5-325 MG tablet Take 1 tablet by mouth every 4 (four) hours as needed for severe pain. 09/03/15   Robert Bellow, MD  pantoprazole (PROTONIX) 40 MG tablet Take 40 mg by mouth every morning. 07/09/15   Historical Provider, MD  PROVENTIL HFA 108 (90 Base) MCG/ACT inhaler INHALE 1 PUFF BY MOUTH 4 TIMES A DAY AS NEEDED 06/09/15   Historical Provider, MD   Meds Ordered and Administered this Visit   Medications  ondansetron (ZOFRAN-ODT) disintegrating tablet 8 mg (8 mg Oral Given 12/17/15 1300)  ipratropium-albuterol (  DUONEB) 0.5-2.5 (3) MG/3ML nebulizer solution 3 mL (3 mLs Nebulization Given 12/17/15 1301)  ipratropium-albuterol (DUONEB) 0.5-2.5 (3) MG/3ML nebulizer solution 3 mL (3 mLs Nebulization Given 12/17/15 1327)  ipratropium-albuterol (DUONEB) 0.5-2.5 (3) MG/3ML nebulizer solution 3 mL (3 mLs Nebulization Given 12/17/15 1400)    BP 115/56 mmHg  Pulse 124  Temp(Src) 99.2 F (37.3 C) (Oral)  Resp 28  Ht 4\' 6"  (1.372 m)  Wt 137 lb (62.143 kg)  BMI 33.01 kg/m2  SpO2 96%  LMP  (LMP Unknown) No data found.   Physical Exam  Constitutional: She appears well-developed and  well-nourished. No distress.  HENT:  Head: Normocephalic and atraumatic.  Right Ear: External ear normal.  Left Ear: External ear normal.  Nose: Rhinorrhea present. No nose lacerations, sinus tenderness, nasal deformity, septal deviation or nasal septal hematoma. No epistaxis.  No foreign bodies.  Mouth/Throat: Uvula is midline and mucous membranes are normal. Posterior oropharyngeal erythema present. No oropharyngeal exudate, posterior oropharyngeal edema or tonsillar abscesses.  Eyes: Conjunctivae and EOM are normal. Pupils are equal, round, and reactive to light. Right eye exhibits no discharge. Left eye exhibits no discharge. No scleral icterus.  Neck: Normal range of motion. Neck supple. No thyromegaly present.  Cardiovascular: Regular rhythm and normal heart sounds.  Tachycardia present.   Pulmonary/Chest: Accessory muscle usage present. Tachypnea noted. No respiratory distress. She has wheezes (diffuse inspiratory and expiratory wheezes). She has rhonchi. She has no rales.  Lymphadenopathy:    She has no cervical adenopathy.  Neurological: She is alert.  Skin: She is not diaphoretic.  Nursing note and vitals reviewed.   ED Course  Procedures (including critical care time)  Labs Review Labs Reviewed  RAPID STREP SCREEN (NOT AT [facility])  CULTURE, GROUP A STREP [facility])    Imaging Review Dg Chest 2 View  12/17/2015  CLINICAL DATA:  Shortness of breath and wheezing EXAM: CHEST  2 VIEW COMPARISON:  04/18/2013 FINDINGS: Patchy left lingular infiltrate is noted. No sizable effusion is seen. The cardiac shadow is within normal limits. No bony abnormality is seen. IMPRESSION: Patchy lingular infiltrate. Electronically Signed   By: Inez Catalina M.D.   On: 12/17/2015 13:58     Visual Acuity Review  Right Eye Distance:   Left Eye Distance:   Bilateral Distance:    Right Eye Near:   Left Eye Near:    Bilateral Near:         MDM   1. Asthma exacerbation   2. Pneumonia involving  left lung, unspecified part of lung     1. x-ray results and diagnoses reviewed with patient and husband;  2. patient given neb treatments x 3 in clinic without improvement of symptoms/signs. Recommend patient go to ED by EMS for further evaluation and management. Patient verbalizes understanding of reasoning for this recommendation. Patient transported to ED by EMS in stable condition. Report called.   Norval Gable, MD 12/17/15 [place], MD 12/17/15 1440

## 2015-12-17 NOTE — ED Notes (Signed)
Pt had dry heaves, vomited up small amount of clear/white phlegm. Pt was very anxious. Pt calming down after neb, can still hear wheezing. Pt doesn't think she can swallow any tylenol or water yet.

## 2015-12-17 NOTE — ED Notes (Signed)
Pt reports fever and wheezing, cough and vomiting since Wed.

## 2015-12-17 NOTE — Discharge Instructions (Signed)

## 2015-12-20 LAB — CULTURE, GROUP A STREP (THRC)

## 2015-12-21 ENCOUNTER — Emergency Department
Admission: EM | Admit: 2015-12-21 | Discharge: 2015-12-21 | Disposition: A | Discharge status: Home / Self Care | Payer: Medicaid Other | Attending: Emergency Medicine | Admitting: Emergency Medicine

## 2015-12-21 ENCOUNTER — Emergency Department: Payer: Medicaid Other

## 2015-12-21 ENCOUNTER — Encounter: Payer: Self-pay | Admitting: Radiology

## 2015-12-21 DIAGNOSIS — R1110 Vomiting, unspecified: Secondary | ICD-10-CM | POA: Diagnosis not present

## 2015-12-21 DIAGNOSIS — Z87891 Personal history of nicotine dependence: Secondary | ICD-10-CM | POA: Diagnosis not present

## 2015-12-21 DIAGNOSIS — Z79899 Other long term (current) drug therapy: Secondary | ICD-10-CM | POA: Diagnosis not present

## 2015-12-21 DIAGNOSIS — R1111 Vomiting without nausea: Secondary | ICD-10-CM

## 2015-12-21 DIAGNOSIS — R059 Cough, unspecified: Secondary | ICD-10-CM

## 2015-12-21 DIAGNOSIS — R05 Cough: Secondary | ICD-10-CM | POA: Diagnosis not present

## 2015-12-21 LAB — CBC
HEMATOCRIT: 41.6 % (ref 35.0–47.0)
Hemoglobin: 13.7 g/dL (ref 12.0–16.0)
MCH: 25.5 pg — AB (ref 26.0–34.0)
MCHC: 32.8 g/dL (ref 32.0–36.0)
MCV: 77.6 fL — AB (ref 80.0–100.0)
PLATELETS: 254 10*3/uL (ref 150–440)
RBC: 5.36 MIL/uL — ABNORMAL HIGH (ref 3.80–5.20)
RDW: 15.4 % — AB (ref 11.5–14.5)
WBC: 7.4 10*3/uL (ref 3.6–11.0)

## 2015-12-21 LAB — URINALYSIS COMPLETE WITH MICROSCOPIC (ARMC ONLY)
BACTERIA UA: NONE SEEN
Bilirubin Urine: NEGATIVE
GLUCOSE, UA: NEGATIVE mg/dL
HGB URINE DIPSTICK: NEGATIVE
LEUKOCYTES UA: NEGATIVE
Nitrite: NEGATIVE
Protein, ur: NEGATIVE mg/dL
Specific Gravity, Urine: 1.017 (ref 1.005–1.030)
pH: 6 (ref 5.0–8.0)

## 2015-12-21 LAB — COMPREHENSIVE METABOLIC PANEL
ALBUMIN: 3.7 g/dL (ref 3.5–5.0)
ALT: 18 U/L (ref 14–54)
AST: 21 U/L (ref 15–41)
Alkaline Phosphatase: 131 U/L — ABNORMAL HIGH (ref 38–126)
Anion gap: 10 (ref 5–15)
BUN: 17 mg/dL (ref 6–20)
CO2: 23 mmol/L (ref 22–32)
CREATININE: 0.63 mg/dL (ref 0.44–1.00)
Calcium: 9.2 mg/dL (ref 8.9–10.3)
Chloride: 105 mmol/L (ref 101–111)
GFR calc Af Amer: >60 mL/min (ref >60)
GFR calc non Af Amer: >60 mL/min (ref >60)
GLUCOSE: 94 mg/dL (ref 65–99)
POTASSIUM: 3.6 mmol/L (ref 3.5–5.1)
Sodium: 138 mmol/L (ref 135–145)
Total Bilirubin: 0.7 mg/dL (ref 0.3–1.2)
Total Protein: 7.1 g/dL (ref 6.5–8.1)

## 2015-12-21 LAB — PREGNANCY, URINE: PREG TEST UR: NEGATIVE

## 2015-12-21 LAB — POCT PREGNANCY, URINE: PREG TEST UR: NEGATIVE

## 2015-12-21 LAB — LIPASE, BLOOD: LIPASE: 20 U/L (ref 11–51)

## 2015-12-21 MED ORDER — ONDANSETRON HCL 4 MG/2ML IJ SOLN
4.0000 mg | Freq: Once | INTRAMUSCULAR | Status: AC
  Administered 2015-12-21: 4 mg via INTRAVENOUS
  Filled 2015-12-21: qty 2

## 2015-12-21 MED ORDER — PROMETHAZINE HCL 12.5 MG PO TABS: 12.5000 mg | ORAL_TABLET | Freq: Four times a day (QID) | ORAL | Status: DC | PRN

## 2015-12-21 MED ORDER — ONDANSETRON HCL 4 MG/2ML IJ SOLN
4.0000 mg | Freq: Once | INTRAMUSCULAR | Status: AC | PRN
  Administered 2015-12-21: 4 mg via INTRAVENOUS
  Filled 2015-12-21: qty 2

## 2015-12-21 MED ORDER — SODIUM CHLORIDE 0.9 % IV BOLUS (SEPSIS)
1000.0000 mL | Freq: Once | INTRAVENOUS | Status: AC
  Administered 2015-12-21: 1000 mL via INTRAVENOUS

## 2015-12-21 MED ORDER — BENZONATATE 100 MG PO CAPS: 100.0000 mg | ORAL_CAPSULE | Freq: Three times a day (TID) | ORAL | Status: DC | PRN

## 2015-12-21 MED ORDER — IOPAMIDOL (ISOVUE-370) INJECTION 76%
75.0000 mL | Freq: Once | INTRAVENOUS | Status: AC | PRN
  Administered 2015-12-21: 75 mL via INTRAVENOUS
  Filled 2015-12-21: qty 75

## 2015-12-21 NOTE — ED Notes (Signed)
Pt states that she was seen on the 29th and diagnosed with pneumonia, pt states that she has been unable to keep anything down since last Saturday, pt states that she went to [place] today because she isn't feeling better, pt states that she has been unable to keep

## 2015-12-21 NOTE — Discharge Instructions (Signed)
Return to the emergency room for any increased shortness of breath, cough or wheeze that is worsening, chest pain or lightheadedness or if you feel worse in any way. Drink plenty of fluids. Take the anti-vomiting medication as prescribed. Finish the antibiotics. He did require a chest x-ray in the next few weeks to make sure things have continued to improve. Cough, Adult Coughing is a reflex that clears your throat and your airways. Coughing helps to heal and protect your lungs. It is normal to cough occasionally, but a cough that happens with other symptoms or lasts a long time may be a sign of a condition that needs treatment. A cough may last only 2-3 weeks (acute), or it may last longer than 8 weeks (chronic). CAUSES Coughing is commonly caused by:  Breathing in substances that irritate your lungs.  A viral or bacterial respiratory infection.  Allergies.  Asthma.  Postnasal drip.  Smoking.  Acid backing up from the stomach into the esophagus (gastroesophageal reflux).  Certain medicines.  Chronic lung problems, including COPD (or rarely, lung cancer).  Other medical conditions such as heart failure. HOME CARE INSTRUCTIONS  Pay attention to any changes in your symptoms. Take these actions to help with your discomfort:  Take medicines only as told by your health care provider.  If you were prescribed an antibiotic medicine, take it as told by your health care provider. Do not stop taking the antibiotic even if you start to feel better.  Talk with your health care provider before you take a cough suppressant medicine.  Drink enough fluid to keep your urine clear or pale yellow.  If the air is dry, use a cold steam vaporizer or humidifier in your bedroom or your home to help loosen secretions.  Avoid anything that causes you to cough at work or at home.  If your cough is worse at night, try sleeping in a semi-upright position.  Avoid cigarette smoke. If you smoke, quit  smoking. If you need help quitting, ask your health care provider.  Avoid caffeine.  Avoid alcohol.  Rest as needed. SEEK MEDICAL CARE IF:   You have new symptoms.  You cough up pus.  Your cough does not get better after 2-3 weeks, or your cough gets worse.  You cannot control your cough with suppressant medicines and you are losing sleep.  You develop pain that is getting worse or pain that is not controlled with pain medicines.  You have a fever.  You have unexplained weight loss.  You have night sweats. SEEK IMMEDIATE MEDICAL CARE IF:  You cough up blood.  You have difficulty breathing.  Your heartbeat is very fast.   This information is not intended to replace advice given to you by your health care provider. Make sure you discuss any questions you have with your health care provider.   Document Released: 01/03/2011 Document Revised: 03/28/2015 Document Reviewed: 09/13/2014 Elsevier Interactive Patient Education [employer].

## 2015-12-21 NOTE — ED Provider Notes (Addendum)
[facility] Emergency Department Provider Note  ____________________________________________   I have reviewed the triage vital signs and the nursing notes.   HISTORY  Chief Complaint Emesis    HPI Karen [name] is a 35 y.o. female presents with multiple complaints. Patient was seen here a few days ago, on 5/29,with a cough and wheeze and diagnosed with a pneumonia. She was given outpatient antibiotics. She has not had a fever since then. She states her cough is getting better but she states every time she takes the Levaquin she "vomits". She vomits twice a day and only when she takes a medication. She was also given cough medication and she states she is using more of that than prescribed because she feels that she vomits when she takes that as well. She states it hurts to cough. She denies any history of blood clots in herself or her family any leg swelling    Past Medical History  Diagnosis Date  . Allergy   . Internal hemorrhoids   . GERD (gastroesophageal reflux disease)   . Anxiety   . Short of breath on exertion     PT STATES SHE CAN WALK A MILE AT HER OWN PACE WITH OUT GETTING SOB    Patient Active Problem List   Diagnosis Date Noted  . Epigastric hernia 08/24/2015    Past Surgical History  Procedure Laterality Date  . Carpal tunnel release Bilateral 2010  . Cesarean section  2009  . Tonsillectomy  2010  . Hip surgery  2013  . Colonoscopy    . [facility]  [facility] [place]  . Epigastric hernia repair N/A 09/03/2015    Procedure: HERNIA REPAIR EPIGASTRIC ADULT;  Surgeon: Robert Bellow, MD;  Location: [facility] ORS;  Service: General;  Laterality: N/A;    Current Outpatient Rx  Name  Route  Sig  Dispense  Refill  . albuterol (PROVENTIL HFA;VENTOLIN HFA) 108 (90 Base) MCG/ACT inhaler   Inhalation   Inhale 2 puffs into the lungs every 6 (six) hours as needed for wheezing or shortness of breath.   1 Inhaler   2   .  budesonide-formoterol (SYMBICORT) 160-4.5 MCG/ACT inhaler      twice a day         . Buprenorphine HCl-Naloxone HCl (SUBOXONE) 8-2 MG FILM      BID         . cetirizine (ZYRTEC) 10 MG tablet   Oral   Take 10 mg by mouth daily.      2   . fluticasone (FLONASE) 50 MCG/ACT nasal spray      USE 2 SPRAYS DAILY AS DIRECTED      2   . guaiFENesin-codeine 100-10 MG/5ML syrup   Oral   Take 5 mLs by mouth every 6 (six) hours as needed for cough.   120 mL   0   . levofloxacin (LEVAQUIN) 750 MG tablet   Oral   Take 1 tablet (750 mg total) by mouth daily.   10 tablet   0   . medroxyPROGESTERone (DEPO-PROVERA) 150 MG/ML injection      INJECT AS DIRECTED INTRAMUSCULARLY EVERY 3 MONTHS      1   . oxyCODONE-acetaminophen (ROXICET) 5-325 MG tablet   Oral   Take 1 tablet by mouth every 4 (four) hours as needed for severe pain.   15 tablet   0   . pantoprazole (PROTONIX) 40 MG tablet   Oral  Take 40 mg by mouth every morning.      1   . predniSONE (DELTASONE) 20 MG tablet   Oral   Take 2 tablets (40 mg total) by mouth daily.   10 tablet   0     Allergies Benadryl; Cyclobenzaprine; Motrin; Ketorolac tromethamine; and Tramadol  Family History  Problem Relation Age of Onset  . COPD Mother   . COPD Father     Social History Social History  Substance Use Topics  . Smoking status: Former Smoker -- 0.50 packs/day for 18 years    Types: Cigarettes    Quit date: 07/21/2014  . Smokeless tobacco: None  . Alcohol Use: No    Review of Systems Constitutional: No fever/chillsSince last visit Eyes: No visual changes. ENT: No sore throat. No stiff neck no neck pain Cardiovascular: Denies chest pain. Unless coughing Respiratory: Denies shortness of breath.Positive cough which is improving Gastrointestinal:   Positive vomiting.  No diarrhea.  No constipation. Genitourinary: Negative for dysuria. Musculoskeletal: Negative lower extremity swelling Skin: Negative for  rash. Neurological: Negative for headaches, focal weakness or numbness. 10-point ROS otherwise negative.  ____________________________________________   PHYSICAL EXAM:  VITAL SIGNS: ED Triage Vitals  Enc Vitals Group     BP 12/21/15 1114 106/70 mmHg     Pulse Rate 12/21/15 1114 109     Resp 12/21/15 1114 20     Temp 12/21/15 1114 98 F (36.7 C)     Temp Source 12/21/15 1114 Oral     SpO2 12/21/15 1114 97 %     Weight 12/21/15 1114 137 lb (62.143 kg)     Height 12/21/15 1114 4\' 6"  (1.372 m)     Head Cir --      Peak Flow --      Pain Score 12/21/15 1115 10     Pain Loc --      Pain Edu? --      Excl. in Blunt? --     Constitutional: Alert and oriented. Well appearing and in no acute distress. Eyes: Conjunctivae are normal. PERRL. EOMI. Head: Atraumatic. Nose: No congestion/rhinnorhea. Mouth/Throat: Mucous membranes are moist.  Oropharynx non-erythematous. Neck: No stridor.   Nontender with no meningismus Cardiovascular: Normal rate, regular rhythm. Grossly normal heart sounds.  Good peripheral circulation. Respiratory: Normal respiratory effort.  No retractions. Lungs CTAB. Abdominal: Soft and nontender. No distention. No guarding no rebound Back:  There is no focal tenderness or step off there is no midline tenderness there are no lesions noted. there is no CVA tenderness Musculoskeletal: No lower extremity tenderness. No joint effusions, no DVT signs strong distal pulses no edema Neurologic:  Normal speech and language. No gross focal neurologic deficits are appreciated.  Skin:  Skin is warm, dry and intact. No rash noted. Psychiatric: Mood and affect are normal. Speech and behavior are normal.  ____________________________________________   LABS (all labs ordered are listed, but only abnormal results are displayed)  Labs Reviewed  COMPREHENSIVE METABOLIC PANEL - Abnormal; Notable for the following:    Alkaline Phosphatase 131 (*)    All other components within  normal limits  CBC - Abnormal; Notable for the following:    RBC 5.36 (*)    MCV 77.6 (*)    MCH 25.5 (*)    RDW 15.4 (*)    All other components within normal limits  URINALYSIS COMPLETEWITH MICROSCOPIC ([facility] ONLY) - Abnormal; Notable for the following:    Color, Urine YELLOW (*)    APPearance CLEAR (*)  Ketones, ur TRACE (*)    Squamous Epithelial / LPF 0-5 (*)    All other components within normal limits  LIPASE, BLOOD  PREGNANCY, URINE  POCT PREGNANCY, URINE   ____________________________________________  EKG  I personally interpreted any EKGs ordered by me or triage  ____________________________________________  RADIOLOGY  I reviewed any imaging ordered by me or triage that were performed during my shift and, if possible, patient and/or family made aware of any abnormal findings. ____________________________________________   PROCEDURES  Procedure(s) performed: None  Critical Care performed: None  ____________________________________________   INITIAL IMPRESSION / ASSESSMENT AND PLAN / ED COURSE  Pertinent labs & imaging results that were available during my care of the patient were reviewed by me and considered in my medical decision making (see chart for details).  Patient states she is very nauseated but she is drinking Dr Malachi Bonds which I take to be good prognostic indicator. Despite reported multiple vomiting's, her kidney function is preserved. We are giving her IV fluid. Patient has not vomited here demonstrated any evidence that she will. She has had no evidence of significant discomfort she is text thing on her telephone. Vital signs are reassuring, I did do a CT scan as I was concerned about her persistent symptomology. There is no evidence of PE. Intact at this time there is no evidence of pneumonia. There is what appears to be atelectasis to the radiologist. We will continue her antibiotics at home as a precaution her lungs are clear she is afebrile with  a normal white count she is tolerating by mouth and she looks quite well at this time. Patient is very angry that is taking a while for the IV to be instilled however I have had to correct her about bending her arm a few times. Apparently, she has not set the time or on her telephone and she states she will walk out the door as soon as her phone timer alarms. It is my hope that we can give her IV hydration prior to that.   ----------------------------------------- 5:38 PM on 12/21/2015 -----------------------------------------  I was able to see the patient prior to the sounding of her alarm. It appears that patient was likely going up from the codeine involved in her cough medication. We'll give her a different kind of cough medication. She has not vomited here she has no acute symptoms. She is aware that she needs to follow up closely with her primary care doctor, return precautions have been given and understood, patient would prefer to go home at this time. We have also advised her that she needs a repeat chest x-ray in the next month or so per radiology. Lungs are clear, sats are 99% at this time, she has not vomited since she's been here heart rate is 96 CT scan is reassuring and I did discuss with radiology they feel this is mostly atelectatic. FINAL CLINICAL IMPRESSION(S) / ED DIAGNOSES  Final diagnoses:  None      This chart was dictated using voice recognition software.  Despite best efforts to proofread,  errors can occur which can change meaning.     Schuyler Amor, MD 12/21/15 1706  Schuyler Amor, MD 12/21/15 1739

## 2015-12-21 NOTE — ED Notes (Signed)
Patient transported to CT 

## 2015-12-21 NOTE — ED Notes (Signed)
Pt asked to speak to this RN, pt states she wants to go home, states no one has updated her or reconnected her fluids, states "If i passed out no one would know!", crying, states she has not been given pain meds, pt updated on that fact that we were waiting on CT results, states the fluid not being connected was a miscommunication, MD notifed

## 2015-12-22 LAB — CULTURE, BLOOD (ROUTINE X 2)
CULTURE: NO GROWTH
Culture: NO GROWTH

## 2016-01-01 ENCOUNTER — Observation Stay
Admission: AD | Admit: 2016-01-01 | Discharge: 2016-01-03 | Disposition: A | Discharge status: Home / Self Care | Payer: Medicaid Other | Source: Ambulatory Visit | Attending: Internal Medicine | Admitting: Internal Medicine

## 2016-01-01 ENCOUNTER — Observation Stay: Payer: Medicaid Other

## 2016-01-01 DIAGNOSIS — R0781 Pleurodynia: Secondary | ICD-10-CM | POA: Insufficient documentation

## 2016-01-01 DIAGNOSIS — J189 Pneumonia, unspecified organism: Secondary | ICD-10-CM | POA: Diagnosis present

## 2016-01-01 DIAGNOSIS — F419 Anxiety disorder, unspecified: Secondary | ICD-10-CM | POA: Insufficient documentation

## 2016-01-01 DIAGNOSIS — R0602 Shortness of breath: Secondary | ICD-10-CM | POA: Insufficient documentation

## 2016-01-01 DIAGNOSIS — N83201 Unspecified ovarian cyst, right side: Secondary | ICD-10-CM | POA: Insufficient documentation

## 2016-01-01 DIAGNOSIS — K449 Diaphragmatic hernia without obstruction or gangrene: Secondary | ICD-10-CM | POA: Insufficient documentation

## 2016-01-01 DIAGNOSIS — Z23 Encounter for immunization: Secondary | ICD-10-CM | POA: Insufficient documentation

## 2016-01-01 DIAGNOSIS — Z79899 Other long term (current) drug therapy: Secondary | ICD-10-CM | POA: Insufficient documentation

## 2016-01-01 DIAGNOSIS — K219 Gastro-esophageal reflux disease without esophagitis: Secondary | ICD-10-CM | POA: Insufficient documentation

## 2016-01-01 DIAGNOSIS — Z87891 Personal history of nicotine dependence: Secondary | ICD-10-CM | POA: Insufficient documentation

## 2016-01-01 DIAGNOSIS — Z888 Allergy status to other drugs, medicaments and biological substances status: Secondary | ICD-10-CM | POA: Diagnosis not present

## 2016-01-01 DIAGNOSIS — F1120 Opioid dependence, uncomplicated: Secondary | ICD-10-CM | POA: Diagnosis not present

## 2016-01-01 DIAGNOSIS — G8929 Other chronic pain: Secondary | ICD-10-CM | POA: Diagnosis not present

## 2016-01-01 DIAGNOSIS — R1013 Epigastric pain: Secondary | ICD-10-CM | POA: Insufficient documentation

## 2016-01-01 DIAGNOSIS — J45909 Unspecified asthma, uncomplicated: Secondary | ICD-10-CM | POA: Diagnosis not present

## 2016-01-01 DIAGNOSIS — Z801 Family history of malignant neoplasm of trachea, bronchus and lung: Secondary | ICD-10-CM | POA: Diagnosis not present

## 2016-01-01 DIAGNOSIS — R112 Nausea with vomiting, unspecified: Secondary | ICD-10-CM

## 2016-01-01 HISTORY — DX: Unspecified asthma, uncomplicated: J45.909

## 2016-01-01 LAB — COMPREHENSIVE METABOLIC PANEL
ALK PHOS: 128 U/L — AB (ref 38–126)
ALT: 22 U/L (ref 14–54)
AST: 25 U/L (ref 15–41)
Albumin: 3.6 g/dL (ref 3.5–5.0)
Anion gap: 8 (ref 5–15)
BUN: 13 mg/dL (ref 6–20)
CALCIUM: 8.9 mg/dL (ref 8.9–10.3)
CO2: 25 mmol/L (ref 22–32)
CREATININE: 0.42 mg/dL — AB (ref 0.44–1.00)
Chloride: 103 mmol/L (ref 101–111)
Glucose, Bld: 107 mg/dL — ABNORMAL HIGH (ref 65–99)
Potassium: 3.8 mmol/L (ref 3.5–5.1)
Sodium: 136 mmol/L (ref 135–145)
TOTAL PROTEIN: 6.8 g/dL (ref 6.5–8.1)
Total Bilirubin: 0.4 mg/dL (ref 0.3–1.2)

## 2016-01-01 LAB — CBC
HEMATOCRIT: 36.4 % (ref 35.0–47.0)
HEMOGLOBIN: 11.9 g/dL — AB (ref 12.0–16.0)
MCH: 25.5 pg — AB (ref 26.0–34.0)
MCHC: 32.7 g/dL (ref 32.0–36.0)
MCV: 78 fL — AB (ref 80.0–100.0)
Platelets: 170 10*3/uL (ref 150–440)
RBC: 4.67 MIL/uL (ref 3.80–5.20)
RDW: 15.3 % — ABNORMAL HIGH (ref 11.5–14.5)
WBC: 5.8 10*3/uL (ref 3.6–11.0)

## 2016-01-01 LAB — LIPASE, BLOOD: LIPASE: 94 U/L — AB (ref 11–51)

## 2016-01-01 LAB — MRSA PCR SCREENING: MRSA BY PCR: NEGATIVE

## 2016-01-01 MED ORDER — CEFTRIAXONE SODIUM 1 G IJ SOLR
1.0000 g | Freq: Once | INTRAMUSCULAR | Status: AC
  Administered 2016-01-01: 14:00:00 1 g via INTRAVENOUS
  Filled 2016-01-01: qty 10

## 2016-01-01 MED ORDER — ONDANSETRON HCL 4 MG PO TABS
4.0000 mg | ORAL_TABLET | Freq: Four times a day (QID) | ORAL | Status: DC | PRN
Start: 2016-01-01 — End: 2016-01-03

## 2016-01-01 MED ORDER — PANTOPRAZOLE SODIUM 40 MG PO TBEC
40.0000 mg | DELAYED_RELEASE_TABLET | Freq: Two times a day (BID) | ORAL | Status: DC
  Administered 2016-01-02 – 2016-01-03 (×3): 40 mg via ORAL
  Filled 2016-01-01 (×3): qty 1

## 2016-01-01 MED ORDER — IOPAMIDOL (ISOVUE-300) INJECTION 61%
100.0000 mL | Freq: Once | INTRAVENOUS | Status: AC | PRN
  Administered 2016-01-01: 100 mL via INTRAVENOUS

## 2016-01-01 MED ORDER — OXYCODONE HCL 5 MG PO TABS
5.0000 mg | ORAL_TABLET | ORAL | Status: DC | PRN
  Administered 2016-01-01 – 2016-01-03 (×9): 5 mg via ORAL
  Filled 2016-01-01 (×9): qty 1

## 2016-01-01 MED ORDER — ONDANSETRON HCL 4 MG/2ML IJ SOLN
4.0000 mg | Freq: Four times a day (QID) | INTRAMUSCULAR | Status: DC | PRN
Start: 2016-01-01 — End: 2016-01-03
  Administered 2016-01-01 – 2016-01-03 (×6): 4 mg via INTRAVENOUS
  Filled 2016-01-01 (×6): qty 2

## 2016-01-01 MED ORDER — DEXTROSE 5 % IV SOLN
500.0000 mg | INTRAVENOUS | Status: DC
  Administered 2016-01-01 – 2016-01-02 (×2): 500 mg via INTRAVENOUS
  Filled 2016-01-01 (×3): qty 500

## 2016-01-01 MED ORDER — PANTOPRAZOLE SODIUM 40 MG PO TBEC
40.0000 mg | DELAYED_RELEASE_TABLET | Freq: Every morning | ORAL | Status: DC
  Administered 2016-01-01: 40 mg via ORAL
  Filled 2016-01-01: qty 1

## 2016-01-01 MED ORDER — PNEUMOCOCCAL VAC POLYVALENT 25 MCG/0.5ML IJ INJ
0.5000 mL | INJECTION | INTRAMUSCULAR | Status: AC
  Administered 2016-01-02: 14:00:00 0.5 mL via INTRAMUSCULAR
  Filled 2016-01-01: qty 0.5

## 2016-01-01 MED ORDER — ENOXAPARIN SODIUM 40 MG/0.4ML ~~LOC~~ SOLN
40.0000 mg | SUBCUTANEOUS | Status: DC
  Administered 2016-01-01 – 2016-01-02 (×2): 40 mg via SUBCUTANEOUS
  Filled 2016-01-01 (×2): qty 0.4

## 2016-01-01 MED ORDER — DIATRIZOATE MEGLUMINE & SODIUM 66-10 % PO SOLN: 15.0000 mL | ORAL | Status: DC

## 2016-01-01 MED ORDER — POLYETHYLENE GLYCOL 3350 17 G PO PACK: 17.0000 g | PACK | Freq: Every day | ORAL | Status: DC | PRN

## 2016-01-01 MED ORDER — ALBUTEROL SULFATE HFA 108 (90 BASE) MCG/ACT IN AERS: 2.0000 | INHALATION_SPRAY | Freq: Four times a day (QID) | RESPIRATORY_TRACT | Status: DC | PRN

## 2016-01-01 MED ORDER — SODIUM CHLORIDE 0.9 % IV SOLN
INTRAVENOUS | Status: DC
  Administered 2016-01-01 – 2016-01-03 (×3): via INTRAVENOUS

## 2016-01-01 MED ORDER — DEXTROSE 5 % IV SOLN
1.0000 g | INTRAVENOUS | Status: DC
  Administered 2016-01-02: 1 g via INTRAVENOUS
  Filled 2016-01-01 (×2): qty 10

## 2016-01-01 MED ORDER — DOCUSATE SODIUM 100 MG PO CAPS
100.0000 mg | ORAL_CAPSULE | Freq: Two times a day (BID) | ORAL | Status: DC
  Administered 2016-01-01 – 2016-01-03 (×5): 100 mg via ORAL
  Filled 2016-01-01 (×5): qty 1

## 2016-01-01 MED ORDER — LORATADINE 10 MG PO TABS
10.0000 mg | ORAL_TABLET | Freq: Every day | ORAL | Status: DC
  Administered 2016-01-01 – 2016-01-03 (×3): 10 mg via ORAL
  Filled 2016-01-01 (×3): qty 1

## 2016-01-01 MED ORDER — MORPHINE SULFATE (PF) 2 MG/ML IV SOLN
2.0000 mg | INTRAVENOUS | Status: DC | PRN
  Administered 2016-01-01 – 2016-01-03 (×10): 2 mg via INTRAVENOUS
  Filled 2016-01-01 (×10): qty 1

## 2016-01-01 MED ORDER — ACETAMINOPHEN 325 MG PO TABS: 650.0000 mg | ORAL_TABLET | Freq: Four times a day (QID) | ORAL | Status: DC | PRN

## 2016-01-01 MED ORDER — MOMETASONE FURO-FORMOTEROL FUM 200-5 MCG/ACT IN AERO
2.0000 | INHALATION_SPRAY | Freq: Two times a day (BID) | RESPIRATORY_TRACT | Status: DC
  Administered 2016-01-01 – 2016-01-03 (×5): 2 via RESPIRATORY_TRACT
  Filled 2016-01-01: qty 8.8

## 2016-01-01 MED ORDER — ALBUTEROL SULFATE (2.5 MG/3ML) 0.083% IN NEBU: 2.5000 mg | INHALATION_SOLUTION | Freq: Four times a day (QID) | RESPIRATORY_TRACT | Status: DC | PRN

## 2016-01-01 MED ORDER — ACETAMINOPHEN 650 MG RE SUPP: 650.0000 mg | Freq: Four times a day (QID) | RECTAL | Status: DC | PRN

## 2016-01-01 MED ORDER — FLUTICASONE PROPIONATE 50 MCG/ACT NA SUSP
1.0000 | Freq: Every day | NASAL | Status: DC
  Administered 2016-01-01 – 2016-01-03 (×2): 1 via NASAL
  Filled 2016-01-01: qty 16

## 2016-01-01 MED ORDER — BENZONATATE 100 MG PO CAPS
200.0000 mg | ORAL_CAPSULE | Freq: Three times a day (TID) | ORAL | Status: DC
  Administered 2016-01-01 – 2016-01-03 (×6): 200 mg via ORAL
  Filled 2016-01-01 (×6): qty 2

## 2016-01-01 MED ORDER — GUAIFENESIN-CODEINE 100-10 MG/5ML PO SOLN: 5.0000 mL | Freq: Four times a day (QID) | ORAL | Status: DC | PRN

## 2016-01-01 NOTE — H&P (Signed)
[facility] [place] NAME: Karen [name]    MR#:  [id number]  DATE OF BIRTH:  1980-12-04  DATE OF ADMISSION:  01/01/2016  PRIMARY CARE PHYSICIAN: Kasandra Knudsen, NP   REQUESTING/REFERRING PHYSICIAN: Dr. Olivia Mackie from [place]:  No chief complaint on file.   HISTORY OF PRESENT ILLNESS:  Karen [name]  is a 35 y.o. female with a known history of GERD, asthma, anxiety, history of chronic opiate dependence, currently on Suboxone sent in from PCPs office secondary to community-acquired pneumonia and intractable nausea vomiting. Patient was in the emergency room 2 weeks ago with fever cough and wheezing. Diagnosed with left-sided pneumonia and discharged on steroids and antibiotics (levaquin). Her symptoms did not improve, she continued to have low-grade fevers, tachypnea and worsening cough and presented back to the emergency room 4 days later. At the time she didn't have temperature, sats were fine x-ray confirmed bibasilar pneumonia. However patient did not want to stay at the time. She also had nausea and vomiting at the time. She went to see her PCP today with ongoing nausea, vomiting, unable to keep anything down. He continues to have occasional cough but low-grade fevers are present. Her breathing is much improved now. Labs and chest x-ray were done at PCPs office and we are waiting to receive them at this time. Patient appears miserable with nausea as soon as she tries to eat.  PAST MEDICAL HISTORY:   Past Medical History  Diagnosis Date  . Allergy   . Internal hemorrhoids   . GERD (gastroesophageal reflux disease)   . Anxiety   . Short of breath on exertion     PT STATES SHE CAN WALK A MILE AT HER OWN PACE WITH OUT GETTING SOB    PAST SURGICAL HISTORY:   Past Surgical History  Procedure Laterality Date  . Carpal tunnel release Bilateral 2010  . Cesarean section  2009  . Tonsillectomy  2010  . Hip surgery   2013  . Colonoscopy    . [facility]  2002    [facility] [place]  . Epigastric hernia repair N/A 09/03/2015    Procedure: HERNIA REPAIR EPIGASTRIC ADULT;  Surgeon: Robert Bellow, MD;  Location: [facility] ORS;  Service: General;  Laterality: N/A;    SOCIAL HISTORY:   Social History  Substance Use Topics  . Smoking status: Former Smoker -- 0.50 packs/day for 18 years    Types: Cigarettes    Quit date: 07/21/2014  . Smokeless tobacco: Not on file  . Alcohol Use: No    FAMILY HISTORY:   Family History  Problem Relation Age of Onset  . COPD Mother   . COPD Father     DRUG ALLERGIES:   Allergies  Allergen Reactions  . Benadryl [Diphenhydramine Hcl] Other (See Comments)    Leg jumpy  . Cyclobenzaprine     Other reaction(s): "legs jumpy"  . Motrin [Ibuprofen] Hives  . Ketorolac Tromethamine Rash  . Tramadol Rash    REVIEW OF SYSTEMS:   Review of Systems  Constitutional: Positive for chills, weight loss and malaise/fatigue. Negative for fever.  HENT: Negative for ear discharge, ear pain, hearing loss and nosebleeds.   Eyes: Negative for blurred vision, double vision and photophobia.  Respiratory: Positive for cough and sputum production. Negative for hemoptysis and wheezing.   Cardiovascular: Positive for chest pain. Negative for palpitations, orthopnea and leg swelling.  Gastrointestinal: Positive for nausea, vomiting and abdominal pain.  Negative for heartburn, diarrhea, constipation and melena.  Genitourinary: Negative for dysuria, urgency and frequency.  Musculoskeletal: Positive for myalgias. Negative for back pain and neck pain.  Skin: Negative for rash.  Neurological: Negative for dizziness, tingling, sensory change, speech change, focal weakness and headaches.  Endo/Heme/Allergies: Does not bruise/bleed easily.  Psychiatric/Behavioral: Negative for depression.    MEDICATIONS AT HOME:   Prior to Admission medications   Medication Sig Start Date End  Date Taking? Authorizing Provider  albuterol (PROVENTIL HFA;VENTOLIN HFA) 108 (90 Base) MCG/ACT inhaler Inhale 2 puffs into the lungs every 6 (six) hours as needed for wheezing or shortness of breath. 12/17/15   Harvest Dark, MD  benzonatate (TESSALON PERLES) 100 MG capsule Take 1 capsule (100 mg total) by mouth 3 (three) times daily as needed for cough. 12/21/15   Schuyler Amor, MD  budesonide-formoterol [facility]) 160-4.5 MCG/ACT inhaler twice a day 10/31/14   Historical Provider, MD  Buprenorphine HCl-Naloxone HCl (SUBOXONE) 8-2 MG FILM BID 10/31/14   Historical Provider, MD  cetirizine (ZYRTEC) 10 MG tablet Take 10 mg by mouth daily. 08/18/15   Historical Provider, MD  fluticasone (FLONASE) 50 MCG/ACT nasal spray USE 2 SPRAYS DAILY AS DIRECTED 08/18/15   Historical Provider, MD  guaiFENesin-codeine 100-10 MG/5ML syrup Take 5 mLs by mouth every 6 (six) hours as needed for cough. 12/17/15   Harvest Dark, MD  medroxyPROGESTERone (DEPO-PROVERA) 150 MG/ML injection INJECT AS DIRECTED INTRAMUSCULARLY EVERY 3 MONTHS 07/30/15   Historical Provider, MD  oxyCODONE-acetaminophen (ROXICET) 5-325 MG tablet Take 1 tablet by mouth every 4 (four) hours as needed for severe pain. 09/03/15   Robert Bellow, MD  pantoprazole (PROTONIX) 40 MG tablet Take 40 mg by mouth every morning. 07/09/15   Historical Provider, MD  predniSONE (DELTASONE) 20 MG tablet Take 2 tablets (40 mg total) by mouth daily. 12/17/15   Harvest Dark, MD  promethazine (PHENERGAN) 12.5 MG tablet Take 1 tablet (12.5 mg total) by mouth every 6 (six) hours as needed for nausea or vomiting. 12/21/15   Schuyler Amor, MD      VITAL SIGNS:  Blood pressure 109/67, pulse 98, temperature 99.1 F (37.3 C), temperature source Oral, resp. rate 18, height 4\' 6"  (1.372 m), weight 57.607 kg (127 lb), SpO2 98 %.  PHYSICAL EXAMINATION:   Physical Exam  GENERAL:  35 y.o.-year-old patient lying in the bed, appears to be in distress secondary to  vomiting episode now.  EYES: Pupils equal, round, reactive to light and accommodation. No scleral icterus. Extraocular muscles intact.  HEENT: Head atraumatic, normocephalic. Oropharynx and nasopharynx clear.  NECK:  Supple, no jugular venous distention. No thyroid enlargement, no tenderness.  LUNGS: Normal breath sounds bilaterally, no wheezing, rales,rhonchi or crepitation. No use of accessory muscles of respiration.  CARDIOVASCULAR: S1, S2 normal. No murmurs, rubs, or gallops.  ABDOMEN: Soft, nontender, nondistended. Bowel sounds present. No organomegaly or mass.  EXTREMITIES: No pedal edema, cyanosis, or clubbing.  NEUROLOGIC: Cranial nerves II through XII are intact. Muscle strength 5/5 in all extremities. Sensation intact. Gait not checked.  PSYCHIATRIC: The patient is alert and oriented x 3.  SKIN: No obvious rash, lesion, or ulcer.   LABORATORY PANEL:   CBC No results for input(s): WBC, HGB, HCT, PLT in the last 168 hours. ------------------------------------------------------------------------------------------------------------------  Chemistries  No results for input(s): NA, K, CL, CO2, GLUCOSE, BUN, CREATININE, CALCIUM, MG, AST, ALT, ALKPHOS, BILITOT in the last 168 hours.  Invalid input(s): GFRCGP ------------------------------------------------------------------------------------------------------------------  Cardiac Enzymes No results for input(s):  TROPONINI in the last 168 hours. ------------------------------------------------------------------------------------------------------------------  RADIOLOGY:  No results found.  EKG:   Orders placed or performed during the hospital encounter of 12/17/15  . ED EKG  . ED EKG    IMPRESSION AND PLAN:   Georgiann Furio  is a 35 y.o. female with a known history of GERD, asthma, anxiety, history of chronic opiate dependence, currently on Suboxone sent in from PCPs office secondary to community-acquired pneumonia and  intractable nausea vomiting.  #1 CAP- CXR pending, recent CT chest with persistent bibasilar infiltrates and now with pleuritic chest pain - pain meds, IV fluids - rocephin and azithro - MRSA pcr  #2 Intractable nausea and vomiting- initially thought to be from her pneumonia, but persists inspite of resp symptoms improving- worsened by food - so change to clear liquids, no soda - CT abd and pelvis  #3 Asthma- stable, cont inh  #4 GERD- increase PPI to bid  #5 Chronic pain- on suboxone- will be held as will be getting narcotics in the hospital  #6 DVT Prophylaxis- on lovenox   All the records are reviewed and case discussed with ED provider. Management plans discussed with the patient, family and they are in agreement.  CODE STATUS: Full code  TOTAL TIME TAKING CARE OF THIS PATIENT: 50 minutes.    Gladstone Lighter M.D on 01/01/2016 at 2:13 PM  Between 7am to 6pm - Pager - [phone]  After 6pm go to [email or web address] - password [email or web address] [facility]  [facility]  Office  [phone]  CC: Primary care physician; Kasandra Knudsen, NP

## 2016-01-01 NOTE — Progress Notes (Signed)
Pharmacy Antibiotic Note  Karen [name] is a 35 y.o. female admitted on 01/01/2016 with pneumonia.  Pharmacy has been consulted for ceftriaxone dosing.  Plan: Patient with orders for Azithromycin 500 mg IV q24h.  Will order Ceftriaxone 1 gm IV q24h.   Height: 4\' 6"  (137.2 cm) Weight: 127 lb (57.607 kg) IBW/kg (Calculated) : 31.7  Temp (24hrs), Avg:99.1 F (37.3 C), Min:99.1 F (37.3 C), Max:99.1 F (37.3 C)  No results for input(s): WBC, CREATININE, LATICACIDVEN, VANCOTROUGH, VANCOPEAK, VANCORANDOM, GENTTROUGH, GENTPEAK, GENTRANDOM, TOBRATROUGH, TOBRAPEAK, TOBRARND, AMIKACINPEAK, AMIKACINTROU, AMIKACIN in the last 168 hours.  Estimated Creatinine Clearance: 65.2 mL/min (by C-G formula based on Cr of 0.63).    Allergies  Allergen Reactions  . Benadryl [Diphenhydramine Hcl] Other (See Comments)    Leg jumpy  . Cyclobenzaprine     Other reaction(s): "legs jumpy"  . Motrin [Ibuprofen] Hives  . Ketorolac Tromethamine Rash  . Tramadol Rash    Antimicrobials this admission: Azithromycin 6/13 >>  Ceftriaxone 6/13 >>    Microbiology results: None  Thank you for allowing pharmacy to be a part of this patient's care.  Aryahi Denzler G 01/01/2016 1:13 PM

## 2016-01-02 ENCOUNTER — Observation Stay: Payer: Medicaid Other

## 2016-01-02 DIAGNOSIS — J189 Pneumonia, unspecified organism: Secondary | ICD-10-CM | POA: Diagnosis not present

## 2016-01-02 LAB — BASIC METABOLIC PANEL
Anion gap: 8 (ref 5–15)
BUN: 9 mg/dL (ref 6–20)
CALCIUM: 8.8 mg/dL — AB (ref 8.9–10.3)
CO2: 24 mmol/L (ref 22–32)
CREATININE: 0.44 mg/dL (ref 0.44–1.00)
Chloride: 107 mmol/L (ref 101–111)
GFR calc Af Amer: >60 mL/min (ref >60)
Glucose, Bld: 100 mg/dL — ABNORMAL HIGH (ref 65–99)
POTASSIUM: 3.5 mmol/L (ref 3.5–5.1)
SODIUM: 139 mmol/L (ref 135–145)

## 2016-01-02 LAB — CBC
HCT: 33.6 % — ABNORMAL LOW (ref 35.0–47.0)
Hemoglobin: 11 g/dL — ABNORMAL LOW (ref 12.0–16.0)
MCH: 25.4 pg — ABNORMAL LOW (ref 26.0–34.0)
MCHC: 32.8 g/dL (ref 32.0–36.0)
MCV: 77.4 fL — ABNORMAL LOW (ref 80.0–100.0)
Platelets: 142 10*3/uL — ABNORMAL LOW (ref 150–440)
RBC: 4.35 MIL/uL (ref 3.80–5.20)
RDW: 15.3 % — ABNORMAL HIGH (ref 11.5–14.5)
WBC: 3.9 10*3/uL (ref 3.6–11.0)

## 2016-01-02 MED ORDER — METHYLPREDNISOLONE SODIUM SUCC 125 MG IJ SOLR
60.0000 mg | Freq: Three times a day (TID) | INTRAMUSCULAR | Status: DC
  Administered 2016-01-02 – 2016-01-03 (×3): 60 mg via INTRAVENOUS
  Filled 2016-01-02 (×3): qty 2

## 2016-01-02 NOTE — Progress Notes (Signed)
Patient ID: Karen [name], female   DOB: 1981/07/13, 35 y.o.   MRN: [id number] [facility] PROGRESS NOTE  GIANELLA BRANNIGAN [id number] DOB: [birth year] DOA: 01/01/2016 PCP: Kasandra Knudsen, NP  HPI/Subjective: Patient with 10 out of 10 pain. Worse with a deep breath. Worse with movement. Has not felt well for days. Patient was on outpatient Levaquin.  Objective: Filed Vitals:   01/02/16 0615 01/02/16 1500  BP: 97/50 107/65  Pulse: 94 92  Temp: 98.3 F (36.8 C) 97.8 F (36.6 C)  Resp: 18 20    Filed Weights   01/01/16 1256  Weight: 57.607 kg (127 lb)    ROS: Review of Systems  Constitutional: Negative for fever and chills.  Eyes: Negative for blurred vision.  Respiratory: Positive for cough and shortness of breath.   Cardiovascular: Positive for chest pain.  Gastrointestinal: Negative for nausea, vomiting, abdominal pain, diarrhea and constipation.  Genitourinary: Negative for dysuria.  Musculoskeletal: Negative for joint pain.  Neurological: Negative for dizziness and headaches.   Exam: Physical Exam  Constitutional: She is oriented to person, place, and time.  HENT:  Nose: No mucosal edema.  Mouth/Throat: No oropharyngeal exudate or posterior oropharyngeal edema.  Eyes: Conjunctivae, EOM and lids are normal. Pupils are equal, round, and reactive to light.  Neck: No JVD present. Carotid bruit is not present. No edema present. No thyroid mass and no thyromegaly present.  Cardiovascular: S1 normal and S2 normal.  Exam reveals no gallop.   No murmur heard. Pulses:      Dorsalis pedis pulses are 2+ on the right side, and 2+ on the left side.  Pain palpating over the xiphoid process and bilateral lower ribs anteriorly  Respiratory: No respiratory distress. She has no wheezes. She has no rhonchi. She has no rales.  GI: Soft. Bowel sounds are normal. There is no tenderness.  Musculoskeletal:       Right ankle: She exhibits no swelling.       Left ankle: She exhibits no  swelling.  Lymphadenopathy:    She has no cervical adenopathy.  Neurological: She is alert and oriented to person, place, and time. No cranial nerve deficit.  Skin: Skin is warm. No rash noted. Nails show no clubbing.  Psychiatric: She has a normal mood and affect.      Data Reviewed: Basic Metabolic Panel:  Recent Labs Lab 01/01/16 1421 01/02/16 0426  NA 136 139  K 3.8 3.5  CL 103 107  CO2 25 24  GLUCOSE 107* 100*  BUN 13 9  CREATININE 0.42* 0.44  CALCIUM 8.9 8.8*   Liver Function Tests:  Recent Labs Lab 01/01/16 1421  AST 25  ALT 22  ALKPHOS 128*  BILITOT 0.4  PROT 6.8  ALBUMIN 3.6    Recent Labs Lab 01/01/16 1421  LIPASE 94*   CBC:  Recent Labs Lab 01/01/16 1421 01/02/16 0426  WBC 5.8 3.9  HGB 11.9* 11.0*  HCT 36.4 33.6*  MCV 78.0* 77.4*  PLT 170 142*     Recent Results (from the past 240 hour(s))  MRSA PCR Screening     Status: None   Collection Time: 01/01/16  4:05 PM  Result Value Ref Range Status   MRSA by PCR NEGATIVE NEGATIVE Final    Comment:        The GeneXpert MRSA Assay (FDA approved for NASAL specimens only), is one component of a comprehensive MRSA colonization surveillance program. It is not intended to diagnose MRSA infection nor to guide or monitor  treatment for MRSA infections.      Studies: Ct Abdomen Pelvis W Contrast  01/01/2016  CLINICAL DATA:  Patient has a community-acquired pneumonia and intractable nausea and vomiting. Low grade fever. EXAM: CT ABDOMEN AND PELVIS WITH CONTRAST TECHNIQUE: Multidetector CT imaging of the abdomen and pelvis was performed using the standard protocol following bolus administration of intravenous contrast. Patient refused p.o. contrast. CONTRAST:  185mL ISOVUE-300 IOPAMIDOL (ISOVUE-300) INJECTION 61% COMPARISON:  April 07, 2014 FINDINGS: Lower chest: There is mild atelectasis of the left lung base. No focal pneumonia is identified in visualized bilateral lung bases. The heart size  is normal. Hepatobiliary: The liver and gallbladder are normal. No masses or other significant abnormality. Pancreas: No mass, inflammatory changes, or other significant abnormality. Spleen: Within normal limits in size and appearance. Adrenals/Urinary Tract: The adrenal glands and kidneys are normal. Fluid-filled bladder is normal. No masses identified. No evidence of hydronephrosis. Stomach/Bowel: No evidence of obstruction, inflammatory process, or abnormal fluid collections. The appendix is not definitely seen but no inflammation is noted around cecum. There is no diverticulitis. There is a small hiatal hernia. Vascular/Lymphatic: No pathologically enlarged lymph nodes. No evidence of abdominal aortic aneurysm. Reproductive: The uterus is normal. There is a 1.4 cm cyst in the right ovary likely follicular cyst. Other: None. Musculoskeletal:  No acute abnormality is identified. IMPRESSION: No bowel obstruction or diverticulitis.  Small hiatal hernia. 1.4 cm cyst in the right ovary likely follicular cyst. Electronically Signed   By: Abelardo Diesel M.D.   On: 01/01/2016 16:29   Dg Chest Port 1 View  01/02/2016  CLINICAL DATA:  Shortness of breath. EXAM: PORTABLE CHEST 1 VIEW COMPARISON:  12/21/2015. FINDINGS: Mediastinum and hilar structures normal. Persistent bibasilar subsegmental atelectasis and/or infiltrates. No prominent pleural effusion. No pneumothorax. Heart size stable. No acute bony abnormality . IMPRESSION: Persistent bibasilar atelectasis and/or infiltrates. Electronically Signed   By: Marcello Moores  Register   On: 01/02/2016 07:32    Scheduled Meds: . azithromycin  500 mg Intravenous Q24H  . benzonatate  200 mg Oral TID  . cefTRIAXone (ROCEPHIN)  IV  1 g Intravenous Q24H  . docusate sodium  100 mg Oral BID  . enoxaparin (LOVENOX) injection  40 mg Subcutaneous Q24H  . fluticasone  1 spray Each Nare Daily  . loratadine  10 mg Oral Daily  . methylPREDNISolone (SOLU-MEDROL) injection  60 mg  Intravenous Q8H  . mometasone-formoterol  2 puff Inhalation BID  . pantoprazole  40 mg Oral BID AC   Continuous Infusions: . sodium chloride 75 mL/hr at 01/02/16 0358    Assessment/Plan:  1. Pleuritic chest pain, failed outpatient treatment of pneumonia with Levaquin. On Rocephin and Zithromax. Add Solu-Medrol. Nebulizer treatments. 2. Nausea vomiting. Lipase slightly high. CT scan of the abdomen and pelvis does not show any information around the pancreas. Not sure what to make of this. When necessary nausea medications. 3. History of asthma 4. History of GERD on Protonix  Code Status:     Code Status Orders        Start     Ordered   01/01/16 1250  Full code   Continuous     01/01/16 1250    Code Status History    Date Active Date Inactive Code Status Order ID Comments User Context   This patient has a current code status but no historical code status.     Disposition Plan: Home in the next day or so  Antibiotics:  Rocephin  Zithromax  Time  spent: 25 minutes  Loletha Grayer  [employer]

## 2016-01-02 NOTE — Progress Notes (Signed)
Initial Nutrition Assessment     INTERVENTION:  -Monitor intake. Discussed bland diet with pt. -If unable to meet nutritional needs will add supplement   NUTRITION DIAGNOSIS:   Inadequate oral intake related to acute illness as evidenced by per patient/family report.    GOAL:   Patient will meet greater than or equal to 90% of their needs    MONITOR:   PO intake  REASON FOR ASSESSMENT:   Malnutrition Screening Tool    ASSESSMENT:      Pt admitted with pneumonia and intractable nausea.  Recently seen in ED for pneumonia about 2 weeks ago.   Past Medical History  Diagnosis Date  . Allergy   . Internal hemorrhoids   . GERD (gastroesophageal reflux disease)   . Anxiety   . Short of breath on exertion     PT STATES SHE CAN WALK A MILE AT HER OWN PACE WITH OUT GETTING SOB  . Asthma    Pt reports for the past 5 days poor po intake secondary to nausea and vomiting (spitting up).  Reports able to tolerate potato chips and graham crackers.    Medications reviewed: colace, solumedrol, NS at 31ml/hr, miralax  Labs reviewed: glucose 100   Unable to complete Nutrition-Focused physical exam at this time.     Diet Order:  Diet regular Room service appropriate?: Yes; Fluid consistency:: Thin  Skin:  Reviewed, no issues  Last BM:  6/13  Height:   Ht Readings from Last 1 Encounters:  01/01/16 4\' 6"  (1.372 m)    Weight: Per pt report 7% wt loss in the last 11 days ?? accuracy  Wt Readings from Last 1 Encounters:  01/01/16 127 lb (57.607 kg)    Ideal Body Weight:     BMI:  Body mass index is 30.6 kg/(m^2).  Estimated Nutritional Needs:   Kcal:  1450-1740 kcals/d  Protein:  70-87 g/d  Fluid:  1.4-1.7 L/d  EDUCATION NEEDS:   No education needs identified at this time  Herman Mell B. Zenia Resides, Alex, [place] (pager) Weekend/On-Call pager [phone])

## 2016-01-03 DIAGNOSIS — J189 Pneumonia, unspecified organism: Secondary | ICD-10-CM | POA: Diagnosis not present

## 2016-01-03 LAB — LIPASE, BLOOD: LIPASE: 15 U/L (ref 11–51)

## 2016-01-03 MED ORDER — AZITHROMYCIN 250 MG PO TABS: ORAL_TABLET | ORAL | Status: DC

## 2016-01-03 MED ORDER — PREDNISONE 5 MG PO TABS: ORAL_TABLET | ORAL | Status: DC

## 2016-01-03 MED ORDER — PREDNISOLONE 5 MG PO TABS: 40.0000 mg | ORAL_TABLET | Freq: Every day | ORAL | Status: DC

## 2016-01-03 MED ORDER — CEFUROXIME AXETIL 500 MG PO TABS: 500.0000 mg | ORAL_TABLET | Freq: Two times a day (BID) | ORAL | Status: DC

## 2016-01-03 MED ORDER — PROMETHAZINE HCL 12.5 MG PO TABS: 12.5000 mg | ORAL_TABLET | Freq: Four times a day (QID) | ORAL | Status: DC | PRN

## 2016-01-03 MED ORDER — PREDNISONE 20 MG PO TABS
40.0000 mg | ORAL_TABLET | Freq: Every day | ORAL | Status: DC
  Administered 2016-01-03: 10:00:00 40 mg via ORAL
  Filled 2016-01-03: qty 2

## 2016-01-03 MED ORDER — OXYCODONE HCL 5 MG PO TABS: 5.0000 mg | ORAL_TABLET | Freq: Four times a day (QID) | ORAL | Status: DC | PRN

## 2016-01-03 NOTE — Plan of Care (Signed)
Problem: Pain Managment: Goal: General experience of comfort will improve Outcome: Not Progressing Continues to complain of Pain on her bilateral rib cage area despite of PRN pain medicines on board.  Comfort measures provided. Repositioning encouraged, Deep breathing exercises advised.

## 2016-01-03 NOTE — Discharge Summary (Signed)
[place] [place] NAME: Karen [name]    MR#:  [id number]  DATE OF BIRTH:  23-Dec-1980  DATE OF ADMISSION:  01/01/2016 ADMITTING PHYSICIAN: Gladstone Lighter, MD  DATE OF DISCHARGE: 01/03/2016 11:40 AM  PRIMARY CARE PHYSICIAN: Kasandra Knudsen, NP    ADMISSION DIAGNOSIS:  Thoracic chest pain nausea vomiting pneumonia  DISCHARGE DIAGNOSIS:  Active Problems:   CAP (community acquired pneumonia)   Pneumonia   SECONDARY DIAGNOSIS:   Past Medical History  Diagnosis Date  . Allergy   . Internal hemorrhoids   . GERD (gastroesophageal reflux disease)   . Anxiety   . Short of breath on exertion     PT STATES SHE CAN WALK A MILE AT HER OWN PACE WITH OUT GETTING SOB  . Asthma     HOSPITAL COURSE:   1. Pleuritic chest pain, failed outpatient treatment of pneumonia with Levaquin. Patient on Rocephin and Zithromax here. I added Solu-Medrol yesterday. Patient's pain is less. Patient would like to go home. I will prescribe a prednisone taper and Ceftin and Zithromax upon going home. I did prescribe a small amount of pain medication 12 pills of oxycodone. 2. Nausea vomiting. This has improved. Lipase was slightly high upon admission and normalized upon discharge. CT scan of the abdomen and pelvis did not show any inflammation around the pancreas.  3. History of asthma 4. History of GERD on Protonix 5. Chronic pain follows up with pain management. She states that she has not take her Suboxone in a few days. She will follow-up with pain management as outpatient.  DISCHARGE CONDITIONS:   Satisfactory  CONSULTS OBTAINED:     DRUG ALLERGIES:   Allergies  Allergen Reactions  . Benadryl [Diphenhydramine Hcl] Other (See Comments)    Leg jumpy  . Cyclobenzaprine     Other reaction(s): "legs jumpy"  . Motrin [Ibuprofen] Hives  . Ketorolac Tromethamine Rash  . Tramadol Rash    DISCHARGE MEDICATIONS:   Discharge Medication List as of  01/03/2016  9:21 AM    START taking these medications   Details  azithromycin (ZITHROMAX) 250 MG tablet One tab po daily until completion, Print    cefUROXime (CEFTIN) 500 MG tablet Take 1 tablet (500 mg total) by mouth 2 (two) times daily with a meal., Starting 01/03/2016, Until Discontinued, Print    oxyCODONE (OXY IR/ROXICODONE) 5 MG immediate release tablet Take 1 tablet (5 mg total) by mouth every 6 (six) hours as needed for moderate pain., Starting 01/03/2016, Until Discontinued, Print      CONTINUE these medications which have CHANGED   Details  predniSONE (DELTASONE) 5 MG tablet 6 tabs by mouth day 1; 5 tabs by mouth day 2; 4 tabs day 3; 3 tabs day 4; 2 tabs day 5; 1 tab day 6, Print    promethazine (PHENERGAN) 12.5 MG tablet Take 1 tablet (12.5 mg total) by mouth every 6 (six) hours as needed for nausea or vomiting., Starting 01/03/2016, Until Discontinued, Print      CONTINUE these medications which have NOT CHANGED   Details  albuterol (PROVENTIL HFA;VENTOLIN HFA) 108 (90 Base) MCG/ACT inhaler Inhale 2 puffs into the lungs every 6 (six) hours as needed for wheezing or shortness of breath., Starting 12/17/2015, Until Discontinued, Print    benzonatate (TESSALON PERLES) 100 MG capsule Take 1 capsule (100 mg total) by mouth 3 (three) times daily as needed for cough., Starting 12/21/2015, Until Discontinued, Print    budesonide-formoterol (SYMBICORT) 160-4.5 MCG/ACT  inhaler twice a day, Historical Med    cetirizine (ZYRTEC) 10 MG tablet Take 10 mg by mouth daily., Starting 08/18/2015, Until Discontinued, Historical Med    fluticasone (FLONASE) 50 MCG/ACT nasal spray USE 2 SPRAYS DAILY AS DIRECTED, Historical Med    guaiFENesin-codeine 100-10 MG/5ML syrup Take 5 mLs by mouth every 6 (six) hours as needed for cough., Starting 12/17/2015, Until Discontinued, Print    medroxyPROGESTERone (DEPO-PROVERA) 150 MG/ML injection INJECT AS DIRECTED INTRAMUSCULARLY EVERY 3 MONTHS, Historical Med     pantoprazole (PROTONIX) 40 MG tablet Take 40 mg by mouth every morning., Starting 07/09/2015, Until Discontinued, Historical Med      STOP taking these medications     Buprenorphine HCl-Naloxone HCl (SUBOXONE) 8-2 MG FILM      oxyCODONE-acetaminophen (ROXICET) 5-325 MG tablet          DISCHARGE INSTRUCTIONS:   Satisfactory  If you experience worsening of your admission symptoms, develop shortness of breath, life threatening emergency, suicidal or homicidal thoughts you must seek medical attention immediately by calling 911 or calling your MD immediately  if symptoms less severe.  You Must read complete instructions/literature along with all the possible adverse reactions/side effects for all the Medicines you take and that have been prescribed to you. Take any new Medicines after you have completely understood and accept all the possible adverse reactions/side effects.   Please note  You were cared for by a hospitalist during your hospital stay. If you have any questions about your discharge medications or the care you received while you were in the hospital after you are discharged, you can call the unit and asked to speak with the hospitalist on call if the hospitalist that took care of you is not available. Once you are discharged, your primary care physician will handle any further medical issues. Please note that NO REFILLS for any discharge medications will be authorized once you are discharged, as it is imperative that you return to your primary care physician (or establish a relationship with a primary care physician if you do not have one) for your aftercare needs so that they can reassess your need for medications and monitor your lab values.    Today   CHIEF COMPLAINT:  No chief complaint on file.   HISTORY OF PRESENT ILLNESS:  Jariel Farid  is a 35 y.o. female with a known history of Chronic pain presents with chest pain around her front and back.   VITAL SIGNS:   Blood pressure 117/76, pulse 99, temperature 97.5 F (36.4 C), temperature source Oral, resp. rate 20, height 4\' 6"  (1.372 m), weight 57.607 kg (127 lb), SpO2 99 %.    PHYSICAL EXAMINATION:  GENERAL:  35 y.o.-year-old patient lying in the bed with no acute distress.  EYES: Pupils equal, round, reactive to light and accommodation. No scleral icterus. Extraocular muscles intact.  HEENT: Head atraumatic, normocephalic. Oropharynx and nasopharynx clear.  NECK:  Supple, no jugular venous distention. No thyroid enlargement, no tenderness.  LUNGS: Normal breath sounds bilaterally, no wheezing, rales,rhonchi or crepitation. No use of accessory muscles of respiration.  CARDIOVASCULAR: S1, S2 normal. No murmurs, rubs, or gallops. Reproducible chest pain over ribs and anteriorly and posteriorly ABDOMEN: Soft, non-tender, non-distended. Bowel sounds present. No organomegaly or mass.  EXTREMITIES: No pedal edema, cyanosis, or clubbing.  NEUROLOGIC: Cranial nerves II through XII are intact. Muscle strength 5/5 in all extremities. Sensation intact. Gait not checked.  PSYCHIATRIC: The patient is alert and oriented x 3.  SKIN: No  obvious rash, lesion, or ulcer.   DATA REVIEW:   CBC  Recent Labs Lab 01/02/16 0426  WBC 3.9  HGB 11.0*  HCT 33.6*  PLT 142*    Chemistries   Recent Labs Lab 01/01/16 1421 01/02/16 0426  NA 136 139  K 3.8 3.5  CL 103 107  CO2 25 24  GLUCOSE 107* 100*  BUN 13 9  CREATININE 0.42* 0.44  CALCIUM 8.9 8.8*  AST 25  --   ALT 22  --   ALKPHOS 128*  --   BILITOT 0.4  --     Microbiology Results  Results for orders placed or performed during the hospital encounter of 01/01/16  MRSA PCR Screening     Status: None   Collection Time: 01/01/16  4:05 PM  Result Value Ref Range Status   MRSA by PCR NEGATIVE NEGATIVE Final    Comment:        The GeneXpert MRSA Assay (FDA approved for NASAL specimens only), is one component of a comprehensive MRSA  colonization surveillance program. It is not intended to diagnose MRSA infection nor to guide or monitor treatment for MRSA infections.     RADIOLOGY:  Dg Chest Port 1 View  01/02/2016  CLINICAL DATA:  Shortness of breath. EXAM: PORTABLE CHEST 1 VIEW COMPARISON:  12/21/2015. FINDINGS: Mediastinum and hilar structures normal. Persistent bibasilar subsegmental atelectasis and/or infiltrates. No prominent pleural effusion. No pneumothorax. Heart size stable. No acute bony abnormality . IMPRESSION: Persistent bibasilar atelectasis and/or infiltrates. Electronically Signed   By: Marcello Moores  Register   On: 01/02/2016 07:32    Management plans discussed with the patient, And she is in agreement.  CODE STATUS:  Code Status History    Date Active Date Inactive Code Status Order ID Comments User Context   01/01/2016 12:50 PM 01/03/2016  2:47 PM Full Code [id number]  Gladstone Lighter, MD Inpatient      TOTAL TIME TAKING CARE OF THIS PATIENT: 35 minutes.    Loletha Grayer M.D on 01/03/2016 at 4:40 PM  Between 7am to 6pm - Pager - [phone]  After 6pm go to [email or web address] - password [email or web address]  [facility] Office  [phone]  CC: Primary care physician; Kasandra Knudsen, NP

## 2016-01-03 NOTE — Discharge Instructions (Signed)
Follow up with pain management as outpatient.

## 2016-01-03 NOTE — Progress Notes (Signed)
Pt has remained A&Ox4 with c/o mid, posterior back pain and mid anterior chest pain that is worse with cough. PRN oxycodone 5mg  has been administered with no relief. Pt lungs have been clear to auscultation on RA. Pt stated she has not been coughing up any phlegm.  VS WNL. Orders to be d/c to home per Dr Leslye Peer with PO antibiotics.

## 2016-06-17 ENCOUNTER — Inpatient Hospital Stay: Payer: Medicaid Other

## 2016-06-17 ENCOUNTER — Inpatient Hospital Stay
Admission: AD | Admit: 2016-06-17 | Discharge: 2016-06-23 | DRG: 202 | Disposition: A | Discharge status: Home / Self Care | Payer: Medicaid Other | Source: Ambulatory Visit | Attending: Internal Medicine | Admitting: Internal Medicine

## 2016-06-17 ENCOUNTER — Encounter: Payer: Self-pay | Admitting: Internal Medicine

## 2016-06-17 DIAGNOSIS — J189 Pneumonia, unspecified organism: Secondary | ICD-10-CM | POA: Diagnosis present

## 2016-06-17 DIAGNOSIS — G894 Chronic pain syndrome: Secondary | ICD-10-CM | POA: Diagnosis present

## 2016-06-17 DIAGNOSIS — R0602 Shortness of breath: Secondary | ICD-10-CM | POA: Diagnosis present

## 2016-06-17 DIAGNOSIS — Z7952 Long term (current) use of systemic steroids: Secondary | ICD-10-CM | POA: Diagnosis not present

## 2016-06-17 DIAGNOSIS — Z87891 Personal history of nicotine dependence: Secondary | ICD-10-CM

## 2016-06-17 DIAGNOSIS — J45901 Unspecified asthma with (acute) exacerbation: Principal | ICD-10-CM | POA: Diagnosis present

## 2016-06-17 DIAGNOSIS — F419 Anxiety disorder, unspecified: Secondary | ICD-10-CM | POA: Diagnosis present

## 2016-06-17 DIAGNOSIS — J9601 Acute respiratory failure with hypoxia: Secondary | ICD-10-CM | POA: Diagnosis present

## 2016-06-17 DIAGNOSIS — Z79899 Other long term (current) drug therapy: Secondary | ICD-10-CM | POA: Diagnosis not present

## 2016-06-17 DIAGNOSIS — Z825 Family history of asthma and other chronic lower respiratory diseases: Secondary | ICD-10-CM | POA: Diagnosis not present

## 2016-06-17 DIAGNOSIS — Z886 Allergy status to analgesic agent status: Secondary | ICD-10-CM

## 2016-06-17 DIAGNOSIS — Z885 Allergy status to narcotic agent status: Secondary | ICD-10-CM

## 2016-06-17 DIAGNOSIS — Z888 Allergy status to other drugs, medicaments and biological substances status: Secondary | ICD-10-CM | POA: Diagnosis not present

## 2016-06-17 DIAGNOSIS — Z7951 Long term (current) use of inhaled steroids: Secondary | ICD-10-CM | POA: Diagnosis not present

## 2016-06-17 DIAGNOSIS — F1120 Opioid dependence, uncomplicated: Secondary | ICD-10-CM | POA: Diagnosis present

## 2016-06-17 DIAGNOSIS — R0902 Hypoxemia: Secondary | ICD-10-CM

## 2016-06-17 LAB — CBC
HEMATOCRIT: 37 % (ref 35.0–47.0)
Hemoglobin: 12.5 g/dL (ref 12.0–16.0)
MCH: 27.8 pg (ref 26.0–34.0)
MCHC: 33.8 g/dL (ref 32.0–36.0)
MCV: 82.3 fL (ref 80.0–100.0)
PLATELETS: 145 10*3/uL — AB (ref 150–440)
RBC: 4.49 MIL/uL (ref 3.80–5.20)
RDW: 14.1 % (ref 11.5–14.5)
WBC: 7.3 10*3/uL (ref 3.6–11.0)

## 2016-06-17 LAB — COMPREHENSIVE METABOLIC PANEL
ALT: 17 U/L (ref 14–54)
AST: 30 U/L (ref 15–41)
Albumin: 3.2 g/dL — ABNORMAL LOW (ref 3.5–5.0)
Alkaline Phosphatase: 129 U/L — ABNORMAL HIGH (ref 38–126)
Anion gap: 9 (ref 5–15)
BILIRUBIN TOTAL: 0.9 mg/dL (ref 0.3–1.2)
BUN: 11 mg/dL (ref 6–20)
CHLORIDE: 102 mmol/L (ref 101–111)
CO2: 23 mmol/L (ref 22–32)
CREATININE: 0.42 mg/dL — AB (ref 0.44–1.00)
Calcium: 8.2 mg/dL — ABNORMAL LOW (ref 8.9–10.3)
Glucose, Bld: 125 mg/dL — ABNORMAL HIGH (ref 65–99)
POTASSIUM: 3.7 mmol/L (ref 3.5–5.1)
Sodium: 134 mmol/L — ABNORMAL LOW (ref 135–145)
TOTAL PROTEIN: 6.5 g/dL (ref 6.5–8.1)

## 2016-06-17 LAB — PROCALCITONIN: Procalcitonin: 0.26 ng/mL

## 2016-06-17 MED ORDER — DEXTROSE 5 % IV SOLN
1.0000 g | INTRAVENOUS | Status: DC
  Administered 2016-06-17 – 2016-06-19 (×3): 1 g via INTRAVENOUS
  Filled 2016-06-17 (×4): qty 10

## 2016-06-17 MED ORDER — ALPRAZOLAM 0.25 MG PO TABS
0.2500 mg | ORAL_TABLET | Freq: Two times a day (BID) | ORAL | Status: DC | PRN
  Administered 2016-06-17 – 2016-06-23 (×12): 0.25 mg via ORAL
  Filled 2016-06-17 (×12): qty 1

## 2016-06-17 MED ORDER — METHYLPREDNISOLONE SODIUM SUCC 125 MG IJ SOLR
60.0000 mg | Freq: Four times a day (QID) | INTRAMUSCULAR | Status: DC
  Administered 2016-06-17 – 2016-06-19 (×8): 60 mg via INTRAVENOUS
  Filled 2016-06-17 (×8): qty 2

## 2016-06-17 MED ORDER — IPRATROPIUM-ALBUTEROL 0.5-2.5 (3) MG/3ML IN SOLN
3.0000 mL | RESPIRATORY_TRACT | Status: DC | PRN
  Administered 2016-06-17: 13:00:00 3 mL via RESPIRATORY_TRACT
  Filled 2016-06-17: qty 3

## 2016-06-17 MED ORDER — PANTOPRAZOLE SODIUM 40 MG PO TBEC
40.0000 mg | DELAYED_RELEASE_TABLET | Freq: Every morning | ORAL | Status: DC
  Administered 2016-06-17 – 2016-06-23 (×7): 40 mg via ORAL
  Filled 2016-06-17 (×7): qty 1

## 2016-06-17 MED ORDER — LORATADINE 10 MG PO TABS
10.0000 mg | ORAL_TABLET | Freq: Every day | ORAL | Status: DC
  Administered 2016-06-17 – 2016-06-23 (×7): 10 mg via ORAL
  Filled 2016-06-17 (×7): qty 1

## 2016-06-17 MED ORDER — SODIUM CHLORIDE 0.9 % IV SOLN
INTRAVENOUS | Status: DC
  Administered 2016-06-17: 17:00:00 via INTRAVENOUS

## 2016-06-17 MED ORDER — LEVOFLOXACIN 500 MG PO TABS: 500.0000 mg | ORAL_TABLET | Freq: Every day | ORAL | Status: DC

## 2016-06-17 MED ORDER — FLUTICASONE PROPIONATE 50 MCG/ACT NA SUSP
2.0000 | Freq: Every day | NASAL | Status: DC
Start: 2016-06-17 — End: 2016-06-23
  Administered 2016-06-17 – 2016-06-23 (×7): 2 via NASAL
  Filled 2016-06-17: qty 16

## 2016-06-17 MED ORDER — OXYCODONE-ACETAMINOPHEN 5-325 MG PO TABS
1.0000 | ORAL_TABLET | Freq: Four times a day (QID) | ORAL | Status: DC | PRN
  Administered 2016-06-17 – 2016-06-18 (×3): 1 via ORAL
  Filled 2016-06-17 (×4): qty 1

## 2016-06-17 MED ORDER — BENZONATATE 100 MG PO CAPS
100.0000 mg | ORAL_CAPSULE | Freq: Three times a day (TID) | ORAL | Status: DC | PRN
  Administered 2016-06-17 – 2016-06-22 (×10): 100 mg via ORAL
  Filled 2016-06-17 (×11): qty 1

## 2016-06-17 MED ORDER — AZITHROMYCIN 250 MG PO TABS
250.0000 mg | ORAL_TABLET | Freq: Every day | ORAL | Status: DC
  Administered 2016-06-17 – 2016-06-23 (×7): 250 mg via ORAL
  Filled 2016-06-17 (×7): qty 1

## 2016-06-17 MED ORDER — PROMETHAZINE HCL 25 MG PO TABS
12.5000 mg | ORAL_TABLET | Freq: Four times a day (QID) | ORAL | Status: DC | PRN
  Administered 2016-06-18: 04:00:00 12.5 mg via ORAL
  Filled 2016-06-17: qty 1

## 2016-06-17 MED ORDER — IPRATROPIUM-ALBUTEROL 0.5-2.5 (3) MG/3ML IN SOLN
3.0000 mL | Freq: Four times a day (QID) | RESPIRATORY_TRACT | Status: DC
  Administered 2016-06-17 – 2016-06-18 (×3): 3 mL via RESPIRATORY_TRACT
  Filled 2016-06-17 (×3): qty 3

## 2016-06-17 NOTE — Progress Notes (Signed)
Dr. Anselm Jungling notified patient's HR remains 120's-130's since admission. Verbal order read back and verified for Normal Saline at 76ml/hr and Tylenol 650mg  Q6H PRN for fever. MD also alerted chest xray showed pneumonia and Levaquin PO currently scheduled for 1800 today. Dr. Anselm Jungling to place appropriate changes for antibiotic therapy.

## 2016-06-17 NOTE — H&P (Addendum)
[facility] [place] NAME: Karen [name]    MR#:  [id number]  DATE OF BIRTH:  05/24/1981  DATE OF ADMISSION:  06/17/2016  PRIMARY CARE PHYSICIAN: Kasandra Knudsen, NP   REQUESTING/REFERRING PHYSICIAN: Olivia Mackie  CHIEF COMPLAINT:  SOB  HISTORY OF PRESENT ILLNESS: Karen [name]  is a 35 y.o. female with a known history of asthma, chronic pain- had SOB and cough for last 4-5 days. Went to PMD today, sent to ER as she was hypoxic. Have low grade fever, no sputum production.  PAST MEDICAL HISTORY:   Past Medical History:  Diagnosis Date  . Allergy   . Anxiety   . Asthma   . GERD (gastroesophageal reflux disease)   . Internal hemorrhoids   . Short of breath on exertion    PT STATES SHE CAN WALK A MILE AT HER OWN PACE WITH OUT GETTING SOB    PAST SURGICAL HISTORY:  Past Surgical History:  Procedure Laterality Date  . CARPAL TUNNEL RELEASE Bilateral 2010  . CESAREAN SECTION  2009  . COLONOSCOPY    . EPIGASTRIC HERNIA REPAIR N/A 09/03/2015   Procedure: HERNIA REPAIR EPIGASTRIC ADULT;  Surgeon: Robert Bellow, MD;  Location: [facility] ORS;  Service: General;  Laterality: N/A;  . HIP SURGERY  2013  . [facility]VARIAN [facility]ST [facility]  [facility] [place]  . TONSILLECTOMY  2010    SOCIAL HISTORY:  Social History  Substance Use Topics  . Smoking status: Former Smoker    Packs/day: 0.50    Years: 18.00    Types: Cigarettes    Quit date: 07/21/2014  . Smokeless tobacco: Not on file  . Alcohol use No    FAMILY HISTORY:  Family History  Problem Relation Age of Onset  . COPD Mother   . COPD Father     DRUG ALLERGIES:  Allergies  Allergen Reactions  . Benadryl [Diphenhydramine Hcl] Other (See Comments)    Leg jumpy  . Cyclobenzaprine     Other reaction(s): "legs jumpy"  . Motrin [Ibuprofen] Hives  . Ketorolac Tromethamine Rash  . Tramadol Rash    REVIEW OF SYSTEMS:   CONSTITUTIONAL: No fever, fatigue or weakness.  EYES: No blurred or  double vision.  EARS, NOSE, AND THROAT: No tinnitus or ear pain.  RESPIRATORY: positive for cough, shortness of breath, wheezing, no hemoptysis.  CARDIOVASCULAR: No chest pain, orthopnea, edema.  GASTROINTESTINAL: No nausea, vomiting, diarrhea or abdominal pain.  GENITOURINARY: No dysuria, hematuria.  ENDOCRINE: No polyuria, nocturia,  HEMATOLOGY: No anemia, easy bruising or bleeding SKIN: No rash or lesion. MUSCULOSKELETAL: No joint pain or arthritis.   NEUROLOGIC: No tingling, numbness, weakness.  PSYCHIATRY: No anxiety or depression.   MEDICATIONS AT HOME:  Prior to Admission medications   Medication Sig Start Date End Date Taking? Authorizing Provider  albuterol (PROVENTIL HFA;VENTOLIN HFA) 108 (90 Base) MCG/ACT inhaler Inhale 2 puffs into the lungs every 6 (six) hours as needed for wheezing or shortness of breath. 12/17/15   Harvest Dark, MD  azithromycin (ZITHROMAX) 250 MG tablet One tab po daily until completion 01/03/16   Loletha Grayer, MD  benzonatate (TESSALON PERLES) 100 MG capsule Take 1 capsule (100 mg total) by mouth 3 (three) times daily as needed for cough. 12/21/15   Schuyler Amor, MD  budesonide-formoterol [facility]) 160-4.5 MCG/ACT inhaler twice a day 10/31/14   Historical Provider, MD  cefUROXime (CEFTIN) 500 MG tablet Take 1 tablet (500 mg total) by mouth 2 (two)  times daily with a meal. 01/03/16   Loletha Grayer, MD  cetirizine (ZYRTEC) 10 MG tablet Take 10 mg by mouth daily. 08/18/15   Historical Provider, MD  fluticasone (FLONASE) 50 MCG/ACT nasal spray USE 2 SPRAYS DAILY AS DIRECTED 08/18/15   Historical Provider, MD  guaiFENesin-codeine 100-10 MG/5ML syrup Take 5 mLs by mouth every 6 (six) hours as needed for cough. 12/17/15   Harvest Dark, MD  medroxyPROGESTERone (DEPO-PROVERA) 150 MG/ML injection INJECT AS DIRECTED INTRAMUSCULARLY EVERY 3 MONTHS 07/30/15   Historical Provider, MD  oxyCODONE (OXY IR/ROXICODONE) 5 MG immediate release tablet Take 1 tablet (5 mg  total) by mouth every 6 (six) hours as needed for moderate pain. 01/03/16   Loletha Grayer, MD  pantoprazole (PROTONIX) 40 MG tablet Take 40 mg by mouth every morning. 07/09/15   Historical Provider, MD  predniSONE (DELTASONE) 5 MG tablet 6 tabs by mouth day 1; 5 tabs by mouth day 2; 4 tabs day 3; 3 tabs day 4; 2 tabs day 5; 1 tab day 6 01/03/16   Loletha Grayer, MD  promethazine (PHENERGAN) 12.5 MG tablet Take 1 tablet (12.5 mg total) by mouth every 6 (six) hours as needed for nausea or vomiting. 01/03/16   Loletha Grayer, MD      PHYSICAL EXAMINATION:   VITAL SIGNS: Blood pressure 119/68, pulse (!) 126, temperature 98.3 F (36.8 C), temperature source Oral, resp. rate (!) 24, SpO2 93 %.  GENERAL:  35 y.o.-year-old patient lying in the bed with no acute distress.  EYES: Pupils equal, round, reactive to light and accommodation. No scleral icterus. Extraocular muscles intact.  HEENT: Head atraumatic, normocephalic. Oropharynx and nasopharynx clear.  NECK:  Supple, no jugular venous distention. No thyroid enlargement, no tenderness.  LUNGS: Normal breath sounds bilaterally, b/l wheezing, no crepitation. positive use of accessory muscles of respiration. Fast rate, on supplemental oxygen. CARDIOVASCULAR: S1, S2 normal. No murmurs, rubs, or gallops.  ABDOMEN: Soft, nontender, nondistended. Bowel sounds present. No organomegaly or mass.  EXTREMITIES: No pedal edema, cyanosis, or clubbing.  NEUROLOGIC: Cranial nerves II through XII are intact. Muscle strength 5/5 in all extremities. Sensation intact. Gait not checked.  PSYCHIATRIC: The patient is alert and oriented x 3.  SKIN: No obvious rash, lesion, or ulcer.   LABORATORY PANEL:   CBC No results for input(s): WBC, HGB, HCT, PLT, MCV, MCH, MCHC, RDW, LYMPHSABS, MONOABS, EOSABS, BASOSABS, BANDABS in the last 168 hours.  Invalid input(s): NEUTRABS,  BANDSABD ------------------------------------------------------------------------------------------------------------------  Chemistries  No results for input(s): NA, K, CL, CO2, GLUCOSE, BUN, CREATININE, CALCIUM, MG, AST, ALT, ALKPHOS, BILITOT in the last 168 hours.  Invalid input(s): GFRCGP ------------------------------------------------------------------------------------------------------------------ CrCl cannot be calculated (Patient's most recent lab result is older than the maximum 21 days allowed.). ------------------------------------------------------------------------------------------------------------------ No results for input(s): TSH, T4TOTAL, T3FREE, THYROIDAB in the last 72 hours.  Invalid input(s): FREET3   Coagulation profile No results for input(s): INR, PROTIME in the last 168 hours. ------------------------------------------------------------------------------------------------------------------- No results for input(s): DDIMER in the last 72 hours. -------------------------------------------------------------------------------------------------------------------  Cardiac Enzymes No results for input(s): CKMB, TROPONINI, MYOGLOBIN in the last 168 hours.  Invalid input(s): CK ------------------------------------------------------------------------------------------------------------------ Invalid input(s): POCBNP  ---------------------------------------------------------------------------------------------------------------  Urinalysis    Component Value Date/Time   COLORURINE YELLOW (A) 12/21/2015 1500   APPEARANCEUR CLEAR (A) 12/21/2015 1500   APPEARANCEUR Hazy 04/07/2014 1000   LABSPEC 1.017 12/21/2015 1500   LABSPEC 1.011 04/07/2014 1000   PHURINE 6.0 12/21/2015 1500   GLUCOSEU NEGATIVE 12/21/2015 1500   GLUCOSEU Negative 04/07/2014 1000   HGBUR  NEGATIVE 12/21/2015 1500   BILIRUBINUR NEGATIVE 12/21/2015 1500   BILIRUBINUR Negative 04/07/2014 1000    KETONESUR TRACE (A) 12/21/2015 1500   PROTEINUR NEGATIVE 12/21/2015 1500   UROBILINOGEN 0.2 01/06/2010 2111   NITRITE NEGATIVE 12/21/2015 1500   LEUKOCYTESUR NEGATIVE 12/21/2015 1500   LEUKOCYTESUR Negative 04/07/2014 1000     RADIOLOGY: No results found.  EKG: Orders placed or performed during the hospital encounter of 12/17/15  . ED EKG  . ED EKG    IMPRESSION AND PLAN:  * Acute hypoxic respi failure with acute asthma exacerbation.   IV steroids, Nebs, Oral levaquin.   Try to collect sputum cx.   Supplemental oxygen.   Check Xray chest.  * chronic pain   Percocet  * Anxiety   Xanax for now.  All the records are reviewed and case discussed with ED provider. Management plans discussed with the patient, family and they are in agreement.  CODE STATUS: Full Code Status History    Date Active Date Inactive Code Status Order ID Comments User Context   01/01/2016 12:50 PM 01/03/2016  2:47 PM Full Code [id number]  Gladstone Lighter, MD Inpatient      Spoke to pt's husband also in room.  TOTAL TIME TAKING CARE OF THIS PATIENT: 50 minutes.    Vaughan Basta M.D on 06/17/2016   Between 7am to 6pm - Pager - [phone]  After 6pm go to [email or web address] - password [email or web address] [facility]  Office  [phone]  CC: Primary care physician; Kasandra Knudsen, NP   Note: This dictation was prepared with Dragon dictation along with smaller phrase technology. Any transcriptional errors that result from this process are unintentional.

## 2016-06-17 NOTE — Progress Notes (Signed)
Patient arrived to unit at 1124am. On call MD pager paged, awaiting response for MD orders and MD to assess patient.  Initial assessment: SOB at rest, RR 24, O2 88% on room air, c/o generalized pain worse in chest, back, and head.

## 2016-06-17 NOTE — Progress Notes (Signed)
Dr. Anselm Jungling to come assess patient shortly. Verbal order for Duoneb Q4H PRN. Regular diet. MD aware oxygen applied upon arrival.

## 2016-06-18 LAB — BASIC METABOLIC PANEL
Anion gap: 7 (ref 5–15)
BUN: 14 mg/dL (ref 6–20)
CHLORIDE: 107 mmol/L (ref 101–111)
CO2: 24 mmol/L (ref 22–32)
Calcium: 8.6 mg/dL — ABNORMAL LOW (ref 8.9–10.3)
Creatinine, Ser: 0.38 mg/dL — ABNORMAL LOW (ref 0.44–1.00)
GFR calc Af Amer: >60 mL/min (ref >60)
GFR calc non Af Amer: >60 mL/min (ref >60)
GLUCOSE: 217 mg/dL — AB (ref 65–99)
POTASSIUM: 3.7 mmol/L (ref 3.5–5.1)
Sodium: 138 mmol/L (ref 135–145)

## 2016-06-18 LAB — CBC
HEMATOCRIT: 34.5 % — AB (ref 35.0–47.0)
Hemoglobin: 11.6 g/dL — ABNORMAL LOW (ref 12.0–16.0)
MCH: 27.4 pg (ref 26.0–34.0)
MCHC: 33.5 g/dL (ref 32.0–36.0)
MCV: 81.6 fL (ref 80.0–100.0)
Platelets: 129 10*3/uL — ABNORMAL LOW (ref 150–440)
RBC: 4.23 MIL/uL (ref 3.80–5.20)
RDW: 14.1 % (ref 11.5–14.5)
WBC: 4.9 10*3/uL (ref 3.6–11.0)

## 2016-06-18 MED ORDER — CODEINE SULFATE 30 MG PO TABS
15.0000 mg | ORAL_TABLET | Freq: Four times a day (QID) | ORAL | Status: DC | PRN
  Administered 2016-06-18: 15 mg via ORAL
  Filled 2016-06-18: qty 0.5
  Filled 2016-06-18: qty 1

## 2016-06-18 MED ORDER — POTASSIUM CHLORIDE IN NACL 20-0.9 MEQ/L-% IV SOLN
INTRAVENOUS | Status: DC
  Administered 2016-06-18: 12:00:00 via INTRAVENOUS
  Filled 2016-06-18 (×2): qty 1000

## 2016-06-18 MED ORDER — IPRATROPIUM-ALBUTEROL 0.5-2.5 (3) MG/3ML IN SOLN
3.0000 mL | RESPIRATORY_TRACT | Status: DC
  Administered 2016-06-18 – 2016-06-19 (×5): 3 mL via RESPIRATORY_TRACT
  Filled 2016-06-18 (×7): qty 3

## 2016-06-18 MED ORDER — GUAIFENESIN ER 600 MG PO TB12
600.0000 mg | ORAL_TABLET | Freq: Two times a day (BID) | ORAL | Status: DC
  Administered 2016-06-18 – 2016-06-20 (×5): 600 mg via ORAL
  Administered 2016-06-20: 300 mg via ORAL
  Administered 2016-06-21: 20:00:00 600 mg via ORAL
  Administered 2016-06-21: 300 mg via ORAL
  Administered 2016-06-22: 600 mg via ORAL
  Administered 2016-06-22: 22:00:00 300 mg via ORAL
  Administered 2016-06-23: 600 mg via ORAL
  Filled 2016-06-18 (×11): qty 1

## 2016-06-18 MED ORDER — HYDROCOD POLST-CPM POLST ER 10-8 MG/5ML PO SUER
5.0000 mL | Freq: Two times a day (BID) | ORAL | Status: DC
  Administered 2016-06-18 – 2016-06-23 (×11): 5 mL via ORAL
  Filled 2016-06-18 (×11): qty 5

## 2016-06-18 MED ORDER — OXYCODONE HCL 5 MG PO TABS
5.0000 mg | ORAL_TABLET | ORAL | Status: DC | PRN
  Administered 2016-06-18 – 2016-06-20 (×12): 5 mg via ORAL
  Filled 2016-06-18 (×12): qty 1

## 2016-06-18 MED ORDER — BUDESONIDE 0.25 MG/2ML IN SUSP
0.2500 mg | Freq: Two times a day (BID) | RESPIRATORY_TRACT | Status: DC
  Administered 2016-06-18 – 2016-06-20 (×5): 0.25 mg via RESPIRATORY_TRACT
  Filled 2016-06-18 (×5): qty 2

## 2016-06-18 NOTE — Progress Notes (Signed)
Chaplain received a call to visit with pt who's grandmother passed away. Chaplain provided the ministry of prayer to pt in room 113.    06/18/16 1100  Clinical Encounter Type  Visited With Patient  Visit Type Initial;Spiritual support  Referral From Nurse  Consult/Referral To Chaplain  Spiritual Encounters  Spiritual Needs Prayer

## 2016-06-18 NOTE — Progress Notes (Signed)
[facility] [place] NAME: Kristien Ladino    MR#:  [id number]  DATE OF BIRTH:  February 24, 1981  SUBJECTIVE:  CHIEF COMPLAINT:  No chief complaint on file. The patient is a 35 year old Caucasian female with past medical history significant for history of chronic pain syndrome, on Suboxone at home, asthma, not tobacco abuser, who presents to the hospital with complaints of shortness of breath, cough, phlegm production, low-grade fever. She was noted to be hypoxic and admitted. She feels a little bit better today, still struggles to breathe, has dark-looking phlegm. Complains of pain all over her body  Review of Systems  Respiratory: Positive for cough, hemoptysis, sputum production and shortness of breath.   Cardiovascular: Positive for chest pain.  Musculoskeletal: Positive for myalgias.    VITAL SIGNS: Blood pressure (!) 112/59, pulse (!) 126, temperature 98.7 F (37.1 C), temperature source Oral, resp. rate 16, height 4\' 6"  (1.372 m), weight 57.2 kg (126 lb 3.2 oz), SpO2 94 %.  PHYSICAL EXAMINATION:   GENERAL:  35 y.o.-year-old patient lying in the bed in mild to moderate respiratory distress, tachypneic, uncomfortable, pale.  EYES: Pupils equal, round, reactive to light and accommodation. No scleral icterus. Extraocular muscles intact.  HEENT: Head atraumatic, normocephalic. Oropharynx and nasopharynx clear.  NECK:  Supple, no jugular venous distention. No thyroid enlargement, no tenderness.  LUNGS: Normal breath sounds bilaterally, no wheezing, scattered rales, and crepitations noted anteriorly and posteriorly. Intermittent use of accessory muscles of respiration. Tachypneic and uncomfortable, dyspneic CARDIOVASCULAR: S1, S2 normal. No murmurs, rubs, or gallops.  ABDOMEN: Soft, nontender, nondistended. Bowel sounds present. No organomegaly or mass.  EXTREMITIES: No pedal edema, cyanosis, or clubbing.  NEUROLOGIC: Cranial nerves II through  XII are intact. Muscle strength 5/5 in all extremities. Sensation intact. Gait not checked.  PSYCHIATRIC: The patient is alert and oriented x 3.  SKIN: No obvious rash, lesion, or ulcer.   ORDERS/RESULTS REVIEWED:   CBC  Recent Labs Lab 06/17/16 1516 06/18/16 0524  WBC 7.3 4.9  HGB 12.5 11.6*  HCT 37.0 34.5*  PLT 145* 129*  MCV 82.3 81.6  MCH 27.8 27.4  MCHC 33.8 33.5  RDW 14.1 14.1   ------------------------------------------------------------------------------------------------------------------  Chemistries   Recent Labs Lab 06/17/16 1516 06/18/16 0524  NA 134* 138  K 3.7 3.7  CL 102 107  CO2 23 24  GLUCOSE 125* 217*  BUN 11 14  CREATININE 0.42* 0.38*  CALCIUM 8.2* 8.6*  AST 30  --   ALT 17  --   ALKPHOS 129*  --   BILITOT 0.9  --    ------------------------------------------------------------------------------------------------------------------ estimated creatinine clearance is 64.3 mL/min (by C-G formula based on SCr of 0.38 mg/dL (L)). ------------------------------------------------------------------------------------------------------------------ No results for input(s): TSH, T4TOTAL, T3FREE, THYROIDAB in the last 72 hours.  Invalid input(s): FREET3  Cardiac Enzymes No results for input(s): CKMB, TROPONINI, MYOGLOBIN in the last 168 hours.  Invalid input(s): CK ------------------------------------------------------------------------------------------------------------------ Invalid input(s): POCBNP ---------------------------------------------------------------------------------------------------------------  RADIOLOGY: Dg Chest 2 View  Result Date: 06/17/2016 CLINICAL DATA:  Hypoxia, worsening shortness of breath, question pneumonia EXAM: CHEST  2 VIEW COMPARISON:  01/02/2016 FINDINGS: Enlargement of cardiac silhouette. Mediastinal contours and pulmonary vascularity normal. Minimal peribronchial thickening. Asymmetric BILATERAL pulmonary  infiltrates RIGHT greater than LEFT especially involving the RIGHT middle lobe, favoring pneumonia. No pleural effusion or pneumothorax. Bones unremarkable. IMPRESSION: BILATERAL pulmonary infiltrates asymmetrically greater on RIGHT most consistent with pneumonia. Electronically Signed   By: Lavonia Dana M.D.   On: 06/17/2016  14:49    EKG:  Orders placed or performed during the hospital encounter of 12/17/15  . ED EKG  . ED EKG    ASSESSMENT AND PLAN:  Principal Problem:   Asthma with acute exacerbation Active Problems:   Asthma exacerbation  #1. Asthma exacerbation, continue steroids, inhalation therapy, nebulizers, adding budesonide, get pulmonologist involved for further recommendations #2. Pneumonia, continue Rocephin and Zithromax, get sputum cultures if possible #3. Opiate withdrawal, now off Suboxone, continue oxycodone every 4 hours as needed, supportive therapy #4. Acute respiratory failure with hypoxia, continue oxygen therapy, now on high flow oxygen, the benefit from CT of chest #5. Tachycardia, likely due to withdrawal and hypoxemia, follow closely, get urine drug screen  Management plans discussed with the patient, family and they are in agreement.   DRUG ALLERGIES:  Allergies  Allergen Reactions  . Benadryl [Diphenhydramine Hcl] Other (See Comments)    Leg jumpy  . Cyclobenzaprine     Other reaction(s): "legs jumpy"  . Motrin [Ibuprofen] Hives  . Ketorolac Tromethamine Rash  . Tramadol Rash    CODE STATUS:     Code Status Orders        Start     Ordered   06/17/16 1407  Full code  Continuous     06/17/16 1406    Code Status History    Date Active Date Inactive Code Status Order ID Comments User Context   01/01/2016 12:50 PM 01/03/2016  2:47 PM Full Code [id number]  Gladstone Lighter, MD Inpatient      TOTAL Critical care TIME TAKING CARE OF THIS PATIENT: 45 minutes.    Theodoro Grist M.D on 06/18/2016 at 4:08 PM  Between 7am to 6pm - Pager -  [phone]  After 6pm go to [email or web address] - password [email or web address] [facility]  [facility]  Office  [phone]  CC: Primary care physician; Kasandra Knudsen, NP

## 2016-06-18 NOTE — Progress Notes (Signed)
Inpatient Diabetes Program Recommendations  AACE/ADA: New Consensus Statement on Inpatient Glycemic Control (2015)  Target Ranges:  Prepandial:   less than 140 mg/dL      Peak postprandial:   less than 180 mg/dL (1-2 hours)      Critically ill patients:  140 - 180 mg/dL   Results for TRESSIA, GOLIS (MRN [id number]) as of 06/18/2016 10:05  Ref. Range 06/17/2016 15:16 06/18/2016 05:24  Glucose Latest Ref Range: 65 - 99 mg/dL 125 (H) 217 (H)   Review of Glycemic Control  Diabetes history: No Outpatient Diabetes medications:NA Current orders for Inpatient glycemic control: None  Inpatient Diabetes Program Recommendations: Correction (SSI): Noted lab glucose 217 mg/dl this morning at 5:24 am. While inpatient and ordered steroids, please consider ordering CBGs with Novolog correction scale ACHS. HgbA1C: Please consider ordering an A1C to evaluate glycemic control over the past 2-3 months.  NOTE: Patient has no history of diabetes. Patient is ordered Solumedrol 60 mg Q6H which is likely cause of hyperglycemia.  Thanks, Barnie Alderman, RN, MSN, CDE Diabetes Coordinator Inpatient Diabetes Program [phone] (Team Pager from 8am to 5pm)

## 2016-06-19 DIAGNOSIS — J9601 Acute respiratory failure with hypoxia: Secondary | ICD-10-CM

## 2016-06-19 DIAGNOSIS — J189 Pneumonia, unspecified organism: Secondary | ICD-10-CM

## 2016-06-19 DIAGNOSIS — J45901 Unspecified asthma with (acute) exacerbation: Principal | ICD-10-CM

## 2016-06-19 LAB — URINE DRUG SCREEN, QUALITATIVE (ARMC ONLY)
Amphetamines, Ur Screen: NOT DETECTED
BARBITURATES, UR SCREEN: NOT DETECTED
Benzodiazepine, Ur Scrn: POSITIVE — AB
CANNABINOID 50 NG, UR ~~LOC~~: NOT DETECTED
COCAINE METABOLITE, UR ~~LOC~~: NOT DETECTED
MDMA (ECSTASY) UR SCREEN: NOT DETECTED
Methadone Scn, Ur: NOT DETECTED
Opiate, Ur Screen: POSITIVE — AB
PHENCYCLIDINE (PCP) UR S: NOT DETECTED
TRICYCLIC, UR SCREEN: NOT DETECTED

## 2016-06-19 LAB — BLOOD GAS, ARTERIAL
Acid-Base Excess: 0.6 mmol/L (ref 0.0–2.0)
Bicarbonate: 24.5 mmol/L (ref 20.0–28.0)
FIO2: 0.32
O2 SAT: 80.7 %
PCO2 ART: 36 mmHg (ref 32.0–48.0)
PO2 ART: 43 mmHg — AB (ref 83.0–108.0)
Patient temperature: 37
pH, Arterial: 7.44 (ref 7.350–7.450)

## 2016-06-19 LAB — GLUCOSE, CAPILLARY
GLUCOSE-CAPILLARY: 201 mg/dL — AB (ref 65–99)
GLUCOSE-CAPILLARY: 180 mg/dL — AB (ref 65–99)
GLUCOSE-CAPILLARY: 244 mg/dL — AB (ref 65–99)
Glucose-Capillary: 160 mg/dL — ABNORMAL HIGH (ref 65–99)

## 2016-06-19 LAB — PROCALCITONIN: Procalcitonin: <0.1 ng/mL

## 2016-06-19 LAB — STREP PNEUMONIAE URINARY ANTIGEN: Strep Pneumo Urinary Antigen: NEGATIVE

## 2016-06-19 MED ORDER — PREDNISONE 5 MG PO TABS: 50.0000 mg | ORAL_TABLET | Freq: Every day | ORAL | Status: DC

## 2016-06-19 MED ORDER — PREDNISONE 5 MG PO TABS
50.0000 mg | ORAL_TABLET | Freq: Every day | ORAL | Status: DC
  Administered 2016-06-20: 50 mg via ORAL
  Filled 2016-06-19: qty 2

## 2016-06-19 MED ORDER — IPRATROPIUM-ALBUTEROL 0.5-2.5 (3) MG/3ML IN SOLN
3.0000 mL | Freq: Four times a day (QID) | RESPIRATORY_TRACT | Status: DC
  Administered 2016-06-19 – 2016-06-20 (×4): 3 mL via RESPIRATORY_TRACT
  Filled 2016-06-19 (×4): qty 3

## 2016-06-19 NOTE — Progress Notes (Signed)
[facility] at [place] NAME: Karen [name]    MR#:  [id number]  DATE OF BIRTH:  01/30/1981  SUBJECTIVE:  CHIEF COMPLAINT:  No chief complaint on file. The patient is a 35 year old Caucasian female with past medical history significant for history of chronic pain syndrome, on Suboxone at home, asthma, not tobacco abuser, who presents to the hospital with complaints of shortness of breath, cough, phlegm production, low-grade fever. She was noted to be hypoxic and admitted. She feels a little bit better today, still struggles to breathe with minimal activity like walking to the bathroom, has dark-looking phlegm. Complains of pain all over her body. Urine sample was elevated to check for toxicology screen but patient throw a sample yesterday.  Review of Systems  Constitutional: Positive for malaise/fatigue. Negative for fever.  Respiratory: Positive for cough, hemoptysis, sputum production and shortness of breath.   Cardiovascular: Positive for chest pain.  Gastrointestinal: Negative for abdominal pain, heartburn, nausea and vomiting.  Musculoskeletal: Positive for myalgias.  Neurological: Negative for tremors, sensory change and focal weakness.    VITAL SIGNS: Blood pressure (!) 116/54, pulse 97, temperature 98.2 F (36.8 C), temperature source Oral, resp. rate 18, height 4\' 6"  (1.372 m), weight 57.2 kg (126 lb 3.2 oz), SpO2 92 %.  PHYSICAL EXAMINATION:   GENERAL:  35 y.o.-year-old patient lying in the bed in mild to moderate respiratory distress, tachypneic, uncomfortable, pale.  EYES: Pupils equal, round, reactive to light and accommodation. No scleral icterus. Extraocular muscles intact.  HEENT: Head atraumatic, normocephalic. Oropharynx and nasopharynx clear.  NECK:  Supple, no jugular venous distention. No thyroid enlargement, no tenderness.  LUNGS: Normal breath sounds bilaterally, no wheezing, scattered rales, and crepitations noted  anteriorly and posteriorly. Intermittent use of accessory muscles of respiration. Tachypneic and uncomfortable, dyspneic CARDIOVASCULAR: S1, S2 normal. No murmurs, rubs, or gallops.  ABDOMEN: Soft, nontender, nondistended. Bowel sounds present. No organomegaly or mass.  EXTREMITIES: No pedal edema, cyanosis, or clubbing.  NEUROLOGIC: Cranial nerves II through XII are intact. Muscle strength 5/5 in all extremities. Sensation intact. Gait not checked.  PSYCHIATRIC: The patient is alert and oriented x 3.  SKIN: No obvious rash, lesion, or ulcer.   ORDERS/RESULTS REVIEWED:   CBC  Recent Labs Lab 06/17/16 1516 06/18/16 0524  WBC 7.3 4.9  HGB 12.5 11.6*  HCT 37.0 34.5*  PLT 145* 129*  MCV 82.3 81.6  MCH 27.8 27.4  MCHC 33.8 33.5  RDW 14.1 14.1   ------------------------------------------------------------------------------------------------------------------  Chemistries   Recent Labs Lab 06/17/16 1516 06/18/16 0524  NA 134* 138  K 3.7 3.7  CL 102 107  CO2 23 24  GLUCOSE 125* 217*  BUN 11 14  CREATININE 0.42* 0.38*  CALCIUM 8.2* 8.6*  AST 30  --   ALT 17  --   ALKPHOS 129*  --   BILITOT 0.9  --    ------------------------------------------------------------------------------------------------------------------ estimated creatinine clearance is 64.3 mL/min (by C-G formula based on SCr of 0.38 mg/dL (L)). ------------------------------------------------------------------------------------------------------------------ No results for input(s): TSH, T4TOTAL, T3FREE, THYROIDAB in the last 72 hours.  Invalid input(s): FREET3  Cardiac Enzymes No results for input(s): CKMB, TROPONINI, MYOGLOBIN in the last 168 hours.  Invalid input(s): CK ------------------------------------------------------------------------------------------------------------------ Invalid input(s):  POCBNP ---------------------------------------------------------------------------------------------------------------  RADIOLOGY: Dg Chest 2 View  Result Date: 06/17/2016 CLINICAL DATA:  Hypoxia, worsening shortness of breath, question pneumonia EXAM: CHEST  2 VIEW COMPARISON:  01/02/2016 FINDINGS: Enlargement of cardiac silhouette. Mediastinal contours and pulmonary vascularity normal. Minimal  peribronchial thickening. Asymmetric BILATERAL pulmonary infiltrates RIGHT greater than LEFT especially involving the RIGHT middle lobe, favoring pneumonia. No pleural effusion or pneumothorax. Bones unremarkable. IMPRESSION: BILATERAL pulmonary infiltrates asymmetrically greater on RIGHT most consistent with pneumonia. Electronically Signed   By: Lavonia Dana M.D.   On: 06/17/2016 14:49    EKG:  Orders placed or performed during the hospital encounter of 12/17/15  . ED EKG  . ED EKG    ASSESSMENT AND PLAN:  Principal Problem:   Asthma with acute exacerbation Active Problems:   Asthma exacerbation  #1. Asthma exacerbation, continue steroids, inhalation therapy, nebulizers, adding budesonide, awaited pulmonologist consult for further recommendations   Encouraged to use incentive spirometer. #2. Pneumonia, continue Rocephin and Zithromax, get sputum cultures if possible #3. Opiate withdrawal, now off Suboxone, continue oxycodone every 4 hours as needed, supportive therapy #4. Acute respiratory failure with hypoxia, continue oxygen therapy, required HFNC yesterday, now on 3 ltr via nasal canula. #5. Tachycardia, likely due to withdrawal and hypoxemia, follow closely, get urine drug screen, pt discarded one sample - Encouraged again today to collect the sample.  Management plans discussed with the patient, family and they are in agreement.   DRUG ALLERGIES:  Allergies  Allergen Reactions  . Benadryl [Diphenhydramine Hcl] Other (See Comments)    Leg jumpy  . Cyclobenzaprine     Other  reaction(s): "legs jumpy"  . Motrin [Ibuprofen] Hives  . Ketorolac Tromethamine Rash  . Tramadol Rash    CODE STATUS:     Code Status Orders        Start     Ordered   06/17/16 1407  Full code  Continuous     06/17/16 1406    Code Status History    Date Active Date Inactive Code Status Order ID Comments User Context   01/01/2016 12:50 PM 01/03/2016  2:47 PM Full Code [id number]  Gladstone Lighter, MD Inpatient      TOTAL TIME TAKING CARE OF THIS PATIENT: 35 minutes.    Vaughan Basta M.D on 06/19/2016 at 9:47 AM  Between 7am to 6pm - Pager - [phone]  After 6pm go to [email or web address] - password [email or web address] [facility]  [facility]  Office  [phone]  CC: Primary care physician; Kasandra Knudsen, NP

## 2016-06-19 NOTE — Consult Note (Signed)
PULMONARY CONSULT NOTE  Requesting MD/Service: Kerry Dory Date of initial consultation: 06/19/16 Reason for consultation: Acute respiratory failure due to CAP. H/O asthma  PT PROFILE: 30 F former smoker with history of asthma diagnosed 2016 admitted 06/17/16 with 6 days of dyspnea, chest pain/tightness, subjective fevers, cough productive of scant purulent sputum and one episode of blood tinged sputum. CXR reveals R>L alveolar infiltrates c/w PNA. Has ben treated with ceftriaxone and azithromycin with subjective improvement  HPI:  As above. She also suffered a prolonged respiratory illness in May/June of this year that was labelled as PNA. She eventually recovered fully from that episode. At her baseline she is rarely limited by DOE. She has been prescribed a Symbicort inhaler which she uses "as needed". She quit smoking 2 yrs ago. She denies significant occupational or environmental exposures. She has no exotic pets and no recent travel. She denies HIV RFs.   Past Medical History:  Diagnosis Date  . Allergy   . Anxiety   . Asthma   . GERD (gastroesophageal reflux disease)   . Internal hemorrhoids   . Short of breath on exertion    PT STATES SHE CAN WALK A MILE AT HER OWN PACE WITH OUT GETTING SOB    Past Surgical History:  Procedure Laterality Date  . CARPAL TUNNEL RELEASE Bilateral 2010  . CESAREAN SECTION  2009  . COLONOSCOPY    . EPIGASTRIC HERNIA REPAIR N/A 09/03/2015   Procedure: HERNIA REPAIR EPIGASTRIC ADULT;  Surgeon: Robert Bellow, MD;  Location: [facility] ORS;  Service: General;  Laterality: N/A;  . HIP SURGERY  2013  . OVARIAN CYST SURGERY  2002   [facility] [place]  . TONSILLECTOMY  2010    MEDICATIONS: I have reviewed all medications and confirmed regimen as documented  Social History   Social History  . Marital status: Single    Spouse name: N/A  . Number of children: N/A  . Years of education: N/A   Occupational History  . Not on file.   Social History  Main Topics  . Smoking status: Former Smoker    Packs/day: 0.50    Years: 18.00    Types: Cigarettes    Quit date: 07/21/2014  . Smokeless tobacco: Never Used  . Alcohol use No  . Drug use: No  . Sexual activity: Yes   Other Topics Concern  . Not on file   Social History Narrative  . No narrative on file    Family History  Problem Relation Age of Onset  . COPD Mother   . COPD Father     ROS: No new focal weakness or sensory deficits No otalgia, hearing loss, visual changes, nasal and sinus symptoms, mouth and throat problems No neck pain or adenopathy No abdominal pain, N/V/D, diarrhea, change in bowel pattern No dysuria, change in urinary pattern   Vitals:   06/19/16 0800 06/19/16 0820 06/19/16 1334 06/19/16 1413  BP:    112/65  Pulse:    97  Resp:    20  Temp:    98.4 F (36.9 C)  TempSrc:    Oral  SpO2: (!) 89% 92% 93% 93%  Weight:      Height:         EXAM:  Gen: Appears fatigued but not overtly dyspneic, no excessive WOB in chair HEENT: NCAT, sclera white, oropharynx normal Neck: Supple without LAN, thyromegaly, JVD Lungs: breath sounds: diminished, percussion: normal, scattered rhonchi, few wheezes Cardiovascular: Tachy, reg,, no murmurs noted Abdomen: Soft, nontender,  normal BS Ext: without clubbing, cyanosis, edema Neuro: CNs grossly intact, motor and sensory intact Skin: Limited exam, no lesions noted  DATA:   BMP Latest Ref Rng & Units 06/18/2016 06/17/2016 01/02/2016  Glucose 65 - 99 mg/dL 217(H) 125(H) 100(H)  BUN 6 - 20 mg/dL 14 11 9   Creatinine 0.44 - 1.00 mg/dL 0.38(L) 0.42(L) 0.44  Sodium 135 - 145 mmol/L 138 134(L) 139  Potassium 3.5 - 5.1 mmol/L 3.7 3.7 3.5  Chloride 101 - 111 mmol/L 107 102 107  CO2 22 - 32 mmol/L 24 23 24   Calcium 8.9 - 10.3 mg/dL 8.6(L) 8.2(L) 8.8(L)    CBC Latest Ref Rng & Units 06/18/2016 06/17/2016 01/02/2016  WBC 3.6 - 11.0 K/uL 4.9 7.3 3.9  Hemoglobin 12.0 - 16.0 g/dL 11.6(L) 12.5 11.0(L)  Hematocrit 35.0  - 47.0 % 34.5(L) 37.0 33.6(L)  Platelets 150 - 440 K/uL 129(L) 145(L) 142(L)    CXR:  moderately extensive bilateral R>L alveolar infiltrates  IMPRESSION:   1) Acute hypoxemic respiratory failure 2) Bilateral infiltrates with history compellling for PNA - seems to be responding to Rx for CAP 3) H/O asthma  PLAN:  1) Resp culture has been sent 2) Check HIV, strep urine Ag, Legionella urine Ag 3) CBC with diff, repeat CXR, PCT in AM 12/01 4) Continue prednisone, nebulized steroids and bronchodilators 5) Continue ceftriaxone and azithromycin 6) Pulmonary medicine will follow. She will likely need to remain hospitalized through the [facility] based on how she looks presently 7) Ultimately, after discharge, she will need follow up in 2-3 weeks to ensure CXR resolution and ong term follow up for asthma    Merton Border, MD PCCM service Mobile [phone] Pager [phone] 06/19/2016

## 2016-06-19 NOTE — Progress Notes (Signed)
Have reminded patient that a urine specimen is needed.  Pt states that she had an accident in the bathroom and didn't make it to the toilet.  [facility] called.

## 2016-06-20 ENCOUNTER — Inpatient Hospital Stay: Payer: Medicaid Other

## 2016-06-20 LAB — CBC WITH DIFFERENTIAL/PLATELET
BASOS ABS: 0 10*3/uL (ref 0–0.1)
Basophils Relative: 0 %
EOS PCT: 0 %
Eosinophils Absolute: 0 10*3/uL (ref 0–0.7)
HCT: 33.6 % — ABNORMAL LOW (ref 35.0–47.0)
Hemoglobin: 11.4 g/dL — ABNORMAL LOW (ref 12.0–16.0)
Lymphocytes Relative: 13 %
Lymphs Abs: 1.4 10*3/uL (ref 1.0–3.6)
MCH: 27.5 pg (ref 26.0–34.0)
MCHC: 33.9 g/dL (ref 32.0–36.0)
MCV: 81.1 fL (ref 80.0–100.0)
MONO ABS: 0.9 10*3/uL (ref 0.2–0.9)
MONOS PCT: 8 %
NEUTROS PCT: 79 %
Neutro Abs: 8.6 10*3/uL — ABNORMAL HIGH (ref 1.4–6.5)
PLATELETS: 176 10*3/uL (ref 150–440)
RBC: 4.14 MIL/uL (ref 3.80–5.20)
RDW: 14.2 % (ref 11.5–14.5)
WBC: 10.9 10*3/uL (ref 3.6–11.0)

## 2016-06-20 LAB — BASIC METABOLIC PANEL
Anion gap: 7 (ref 5–15)
BUN: 20 mg/dL (ref 6–20)
CHLORIDE: 104 mmol/L (ref 101–111)
CO2: 28 mmol/L (ref 22–32)
Calcium: 8.5 mg/dL — ABNORMAL LOW (ref 8.9–10.3)
Creatinine, Ser: 0.46 mg/dL (ref 0.44–1.00)
Glucose, Bld: 143 mg/dL — ABNORMAL HIGH (ref 65–99)
POTASSIUM: 4.1 mmol/L (ref 3.5–5.1)
SODIUM: 139 mmol/L (ref 135–145)

## 2016-06-20 LAB — GLUCOSE, CAPILLARY
GLUCOSE-CAPILLARY: 247 mg/dL — AB (ref 65–99)
Glucose-Capillary: 183 mg/dL — ABNORMAL HIGH (ref 65–99)

## 2016-06-20 LAB — PROCALCITONIN: Procalcitonin: <0.1 ng/mL

## 2016-06-20 LAB — HIV ANTIBODY (ROUTINE TESTING W REFLEX): HIV Screen 4th Generation wRfx: NONREACTIVE

## 2016-06-20 MED ORDER — BUDESONIDE 0.5 MG/2ML IN SUSP
0.5000 mg | Freq: Two times a day (BID) | RESPIRATORY_TRACT | Status: DC
  Administered 2016-06-20 – 2016-06-23 (×6): 0.5 mg via RESPIRATORY_TRACT
  Filled 2016-06-20 (×6): qty 2

## 2016-06-20 MED ORDER — INSULIN ASPART 100 UNIT/ML ~~LOC~~ SOLN
0.0000 [IU] | Freq: Three times a day (TID) | SUBCUTANEOUS | Status: DC
  Administered 2016-06-20 – 2016-06-21 (×2): 2 [IU] via SUBCUTANEOUS
  Administered 2016-06-21: 17:00:00 3 [IU] via SUBCUTANEOUS
  Administered 2016-06-22: 1 [IU] via SUBCUTANEOUS
  Administered 2016-06-22: 08:00:00 2 [IU] via SUBCUTANEOUS
  Administered 2016-06-22: 17:00:00 3 [IU] via SUBCUTANEOUS
  Administered 2016-06-23: 2 [IU] via SUBCUTANEOUS
  Filled 2016-06-20: qty 3
  Filled 2016-06-20: qty 1
  Filled 2016-06-20: qty 3
  Filled 2016-06-20 (×2): qty 2
  Filled 2016-06-20: qty 3
  Filled 2016-06-20: qty 2

## 2016-06-20 MED ORDER — CEFTRIAXONE SODIUM-DEXTROSE 1-3.74 GM-% IV SOLR
1.0000 g | INTRAVENOUS | Status: DC
  Administered 2016-06-20 – 2016-06-22 (×3): 1 g via INTRAVENOUS
  Filled 2016-06-20 (×3): qty 50

## 2016-06-20 MED ORDER — MORPHINE SULFATE (PF) 4 MG/ML IV SOLN
2.0000 mg | INTRAVENOUS | Status: DC | PRN
  Administered 2016-06-20 – 2016-06-21 (×2): 2 mg via INTRAVENOUS
  Filled 2016-06-20 (×3): qty 1

## 2016-06-20 MED ORDER — INSULIN ASPART 100 UNIT/ML ~~LOC~~ SOLN
0.0000 [IU] | Freq: Every day | SUBCUTANEOUS | Status: DC
  Administered 2016-06-20: 2 [IU] via SUBCUTANEOUS
  Filled 2016-06-20: qty 2

## 2016-06-20 MED ORDER — METHYLPREDNISOLONE SODIUM SUCC 40 MG IJ SOLR
40.0000 mg | Freq: Two times a day (BID) | INTRAMUSCULAR | Status: DC
  Administered 2016-06-20 – 2016-06-23 (×6): 40 mg via INTRAVENOUS
  Filled 2016-06-20 (×6): qty 1

## 2016-06-20 MED ORDER — MORPHINE SULFATE (PF) 4 MG/ML IV SOLN
2.0000 mg | INTRAVENOUS | Status: DC | PRN
Start: 2016-06-20 — End: 2016-06-20
  Administered 2016-06-20: 13:00:00 2 mg via INTRAVENOUS
  Filled 2016-06-20: qty 1

## 2016-06-20 MED ORDER — IPRATROPIUM-ALBUTEROL 0.5-2.5 (3) MG/3ML IN SOLN
3.0000 mL | RESPIRATORY_TRACT | Status: DC
  Administered 2016-06-20 – 2016-06-22 (×11): 3 mL via RESPIRATORY_TRACT
  Filled 2016-06-20 (×11): qty 3

## 2016-06-20 NOTE — Progress Notes (Signed)
I admitted patient 06/17/16 directly from [place] (Dr. Marigene Ehlers) and patient stated they swabbed her for the flu and it was negative. I have tried calling the [place] office but keep getting the voicemail. Will re-swab as ordered by Dr. Mortimer Fries and place patient on droplet precautions until results are available.

## 2016-06-20 NOTE — Progress Notes (Addendum)
Otelia Limes CMA at [facility] confirmed flu PCR swab negative 06/17/16. Will notify Dr. Mortimer Fries.  Varney Biles called back and is going to fax results. Will place in patient's chart when received.

## 2016-06-20 NOTE — Progress Notes (Signed)
[place] at [place] NAME: Karen [name]    MR#:  [id number]  DATE OF BIRTH:  [birth year]  SUBJECTIVE:  CHIEF COMPLAINT:  No chief complaint on file. The patient is a 35 year old Caucasian female with past medical history significant for history of chronic pain syndrome, on Suboxone at home, asthma, not tobacco abuser, who presents to the hospital with complaints of shortness of breath, cough, phlegm production, low-grade fever. She was noted to be hypoxic and admitted. She feels a little bit better today, still struggles to breathe with minimal activity like walking to the bathroom, has dark-looking phlegm. Complains of pain all over her body.   Review of Systems  Constitutional: Positive for malaise/fatigue. Negative for fever.  Respiratory: Positive for cough, hemoptysis, sputum production and shortness of breath.   Cardiovascular: Positive for chest pain.  Gastrointestinal: Negative for abdominal pain, heartburn, nausea and vomiting.  Musculoskeletal: Positive for myalgias.  Neurological: Negative for tremors, sensory change and focal weakness.    VITAL SIGNS: Blood pressure 138/66, pulse 91, temperature 98.2 F (36.8 C), temperature source Oral, resp. rate 16, height 4\' 6"  (1.372 m), weight 57.2 kg (126 lb 3.2 oz), SpO2 96 %.  PHYSICAL EXAMINATION:   GENERAL:  35 y.o.-year-old patient lying in the bed in mild to moderate respiratory distress, tachypneic, uncomfortable, pale.  EYES: Pupils equal, round, reactive to light and accommodation. No scleral icterus. Extraocular muscles intact.  HEENT: Head atraumatic, normocephalic. Oropharynx and nasopharynx clear.  NECK:  Supple, no jugular venous distention. No thyroid enlargement, no tenderness.  LUNGS: Normal breath sounds bilaterally, no wheezing, scattered rales, and crepitations noted anteriorly and posteriorly. Intermittent use of accessory muscles of respiration. Tachypneic and  uncomfortable, dyspneic CARDIOVASCULAR: S1, S2 normal. No murmurs, rubs, or gallops.  ABDOMEN: Soft, nontender, nondistended. Bowel sounds present. No organomegaly or mass.  EXTREMITIES: No pedal edema, cyanosis, or clubbing.  NEUROLOGIC: Cranial nerves II through XII are intact. Muscle strength 5/5 in all extremities. Sensation intact. Gait not checked.  PSYCHIATRIC: The patient is alert and oriented x 3.  SKIN: No obvious rash, lesion, or ulcer.   ORDERS/RESULTS REVIEWED:   CBC  Recent Labs Lab 06/17/16 1516 06/18/16 0524 06/20/16 0449  WBC 7.3 4.9 10.9  HGB 12.5 11.6* 11.4*  HCT 37.0 34.5* 33.6*  PLT 145* 129* 176  MCV 82.3 81.6 81.1  MCH 27.8 27.4 27.5  MCHC 33.8 33.5 33.9  RDW 14.1 14.1 14.2  LYMPHSABS  --   --  1.4  MONOABS  --   --  0.9  EOSABS  --   --  0.0  BASOSABS  --   --  0.0   ------------------------------------------------------------------------------------------------------------------  Chemistries   Recent Labs Lab 06/17/16 1516 06/18/16 0524 06/20/16 0449  NA 134* 138 139  K 3.7 3.7 4.1  CL 102 107 104  CO2 23 24 28   GLUCOSE 125* 217* 143*  BUN 11 14 20   CREATININE 0.42* 0.38* 0.46  CALCIUM 8.2* 8.6* 8.5*  AST 30  --   --   ALT 17  --   --   ALKPHOS 129*  --   --   BILITOT 0.9  --   --    ------------------------------------------------------------------------------------------------------------------ estimated creatinine clearance is 64.3 mL/min (by C-G formula based on SCr of 0.46 mg/dL). ------------------------------------------------------------------------------------------------------------------ No results for input(s): TSH, T4TOTAL, T3FREE, THYROIDAB in the last 72 hours.  Invalid input(s): FREET3  Cardiac Enzymes No results for input(s): CKMB, TROPONINI, MYOGLOBIN in the last  168 hours.  Invalid input(s):  CK ------------------------------------------------------------------------------------------------------------------ Invalid input(s): POCBNP ---------------------------------------------------------------------------------------------------------------  RADIOLOGY: Dg Chest Port 1 View  Result Date: 06/20/2016 CLINICAL DATA:  Followup pneumonia EXAM: PORTABLE CHEST 1 VIEW COMPARISON:  06/17/2016 FINDINGS: Cardiac shadow is stable. Patchy infiltrates are again seen bilaterally worse in the right lung base. The overall appearance however has improved slightly in the interval from the prior exam. No bony abnormality is noted. IMPRESSION: Persistent but improved infiltrates bilaterally. Electronically Signed   By: Inez Catalina M.D.   On: 06/20/2016 08:02    EKG:  Orders placed or performed during the hospital encounter of 12/17/15  . ED EKG  . ED EKG    ASSESSMENT AND PLAN:  Principal Problem:   Asthma with acute exacerbation Active Problems:   Asthma exacerbation  #1. Asthma exacerbation, continue steroids, inhalation therapy, nebulizers, adding budesonide, appreciated pulmonologist consult for further recommendations   Encouraged to use incentive spirometer. #2. Pneumonia, continue Rocephin and Zithromax, get sputum cultures if possible  Pulm suggested to check Influenza swab- she was tested in her clinic - on day of admission- we received the result- and it is negative. #3. Opiate withdrawal, now off Suboxone, continue oxycodone every 4 hours as needed, supportive therapy   She have pain medicine seeking behaviour. #4. Acute respiratory failure with hypoxia, continue oxygen therapy, required HFNC yesterday, now on 3 ltr via nasal canula. #5. Tachycardia, likely due to withdrawal and hypoxemia, follow closely,  Management plans discussed with the patient, family and they are in agreement.   DRUG ALLERGIES:  Allergies  Allergen Reactions  . Benadryl [Diphenhydramine Hcl] Other  (See Comments)    Leg jumpy  . Cyclobenzaprine     Other reaction(s): "legs jumpy"  . Motrin [Ibuprofen] Hives  . Ketorolac Tromethamine Rash  . Tramadol Rash    CODE STATUS:     Code Status Orders        Start     Ordered   06/17/16 1407  Full code  Continuous     06/17/16 1406    Code Status History    Date Active Date Inactive Code Status Order ID Comments User Context   01/01/2016 12:50 PM 01/03/2016  2:47 PM Full Code [id number]  Gladstone Lighter, MD Inpatient      TOTAL TIME TAKING CARE OF THIS PATIENT: 35 minutes.    Vaughan Basta M.D on 06/20/2016 at 5:10 PM  Between 7am to 6pm - Pager - [phone]  After 6pm go to [email or web address] - password [email or web address] [facility]  [facility]  Office  [phone]  CC: Primary care physician; Kasandra Knudsen, NP

## 2016-06-20 NOTE — Consult Note (Signed)
PULMONARY CONSULT NOTE  Requesting MD/Service: Kerry Dory Date of initial consultation: 06/19/16 Reason for consultation: Acute respiratory failure due to CAP. H/O asthma  PT PROFILE: 61 F former smoker with history of asthma diagnosed 2016 admitted 06/17/16 with 6 days of dyspnea, chest pain/tightness, subjective fevers, cough productive of scant purulent sputum and one episode of blood tinged sputum. CXR reveals R>L alveolar infiltrates c/w PNA. Has ben treated with ceftriaxone and azithromycin with subjective improvement  HPI:  As above. She also suffered a prolonged respiratory illness in May/June of this year that was labelled as PNA. She eventually recovered fully from that episode. At her baseline she is rarely limited by DOE. She has been prescribed a Symbicort inhaler which she uses "as needed". She quit smoking 2 yrs ago. She denies significant occupational or environmental exposures. She has no exotic pets and no recent travel. She denies HIV RFs.   SUBJECTIVE: Slight increased WOB +rhonchi CXR b/l infiltrates better Feels bad, has pleuritic pain   ROS: No new focal weakness or sensory deficits No otalgia, hearing loss, visual changes, nasal and sinus symptoms, mouth and throat problems No neck pain or adenopathy No abdominal pain, N/V/D, diarrhea, change in bowel pattern No dysuria, change in urinary pattern +SOB, +Wheezing+malaise Feels bad   Vitals:   06/20/16 0218 06/20/16 0425 06/20/16 0732 06/20/16 0756  BP:  (!) 104/53  121/62  Pulse:  95  90  Resp:  18    Temp:  98.4 F (36.9 C)  98.5 F (36.9 C)  TempSrc:  Oral  Oral  SpO2: 95% 94% 95% 100%  Weight:      Height:         EXAM:  Gen: Appears fatigued, +dyspneic,  HEENT: NCAT, sclera white, oropharynx normal Neck: Supple without LAN, thyromegaly, JVD Lungs: breath sounds: diminished, percussion: normal, scattered rhonchi, few wheezes Cardiovascular: Tachy, reg,, no murmurs noted Abdomen: Soft,  nontender, normal BS Ext: without clubbing, cyanosis, edema Neuro: CNs grossly intact, motor and sensory intact Skin: Limited exam, no lesions noted  DATA:   BMP Latest Ref Rng & Units 06/20/2016 06/18/2016 06/17/2016  Glucose 65 - 99 mg/dL 143(H) 217(H) 125(H)  BUN 6 - 20 mg/dL 20 14 11   Creatinine 0.44 - 1.00 mg/dL 0.46 0.38(L) 0.42(L)  Sodium 135 - 145 mmol/L 139 138 134(L)  Potassium 3.5 - 5.1 mmol/L 4.1 3.7 3.7  Chloride 101 - 111 mmol/L 104 107 102  CO2 22 - 32 mmol/L 28 24 23   Calcium 8.9 - 10.3 mg/dL 8.5(L) 8.6(L) 8.2(L)    CBC Latest Ref Rng & Units 06/20/2016 06/18/2016 06/17/2016  WBC 3.6 - 11.0 K/uL 10.9 4.9 7.3  Hemoglobin 12.0 - 16.0 g/dL 11.4(L) 11.6(L) 12.5  Hematocrit 35.0 - 47.0 % 33.6(L) 34.5(L) 37.0  Platelets 150 - 440 K/uL 176 129(L) 145(L)    CXR:  moderately extensive bilateral R>L alveolar infiltrates  IMPRESSION:   1) Acute hypoxemic respiratory failure 2) Bilateral infiltrates with history compellling for PNA - seems to be responding to Rx for CAP 3) H/O asthma  PLAN:  1) Resp culture has been sent 2) check FLU panel 3) change to IV steroids, nebulized steroids and bronchodilators 4) Continue ceftriaxone and azithromycin 5) Pulmonary medicine will follow.  6) Ultimately, after discharge, she will need follow up in 2-3 weeks to ensure CXR resolution and ong term follow up for asthma 7)morphine as needed  I have personally obtained a history, examined the patient, evaluated Pertinent laboratory and RadioGraphic/imaging results, and  formulated  the assessment and plan   The Patient requires high complexity decision making for assessment and support, frequent evaluation and titration of therapies.  Patient satisfied with Plan of action and management. All questions answered  Corrin Parker, M.D.  [facility]  Medical Director [place] Director [facility] Cardio-Pulmonary Department

## 2016-06-20 NOTE — Progress Notes (Signed)
Pt agitated at beginning of shift when said nurse told her the tussionex wasn't due for administration until 9:30pm.  Had to explain that it was a Q12 hour med.  Offered tessalon perles, but she declined.

## 2016-06-21 LAB — GLUCOSE, CAPILLARY
GLUCOSE-CAPILLARY: 159 mg/dL — AB (ref 65–99)
Glucose-Capillary: 178 mg/dL — ABNORMAL HIGH (ref 65–99)
Glucose-Capillary: 112 mg/dL — ABNORMAL HIGH (ref 65–99)
Glucose-Capillary: 232 mg/dL — ABNORMAL HIGH (ref 65–99)

## 2016-06-21 LAB — LEGIONELLA PNEUMOPHILA SEROGP 1 UR AG: L. pneumophila Serogp 1 Ur Ag: NEGATIVE

## 2016-06-21 LAB — HEMOGLOBIN A1C
HEMOGLOBIN A1C: 5.8 % — AB (ref 4.8–5.6)
MEAN PLASMA GLUCOSE: 120 mg/dL

## 2016-06-21 LAB — PROCALCITONIN: Procalcitonin: <0.1 ng/mL

## 2016-06-21 MED ORDER — MORPHINE SULFATE (PF) 4 MG/ML IV SOLN
2.0000 mg | INTRAVENOUS | Status: DC | PRN
  Administered 2016-06-21 – 2016-06-22 (×4): 2 mg via INTRAVENOUS
  Filled 2016-06-21 (×3): qty 1

## 2016-06-21 MED ORDER — OXYCODONE HCL 5 MG PO TABS
10.0000 mg | ORAL_TABLET | ORAL | Status: DC | PRN
  Administered 2016-06-21 – 2016-06-22 (×3): 10 mg via ORAL
  Filled 2016-06-21 (×3): qty 2

## 2016-06-21 NOTE — Progress Notes (Signed)
[place] at [place] NAME: Bernestine Nalley    MRN#:  [id number]  DATE OF BIRTH:  [birth year]  SUBJECTIVE:  Hospital Day: 4 days Genay Dantoni is a 35 y.o. female presenting with Shortness of breath.   Overnight events: No acute overnight events Interval Events: Continues complaints of cough, nonproductive, shortness of breath no fever no chills  REVIEW OF SYSTEMS:  CONSTITUTIONAL: No fever, fatigue or weakness.  EYES: No blurred or double vision.  EARS, NOSE, AND THROAT: No tinnitus or ear pain.  RESPIRATORY: Positive cough, shortness of breath, wheezing denies hemoptysis.  CARDIOVASCULAR: No chest pain, orthopnea, edema.  GASTROINTESTINAL: No nausea, vomiting, diarrhea or abdominal pain.  GENITOURINARY: No dysuria, hematuria.  ENDOCRINE: No polyuria, nocturia,  HEMATOLOGY: No anemia, easy bruising or bleeding SKIN: No rash or lesion. MUSCULOSKELETAL: No joint pain or arthritis.   NEUROLOGIC: No tingling, numbness, weakness.  PSYCHIATRY: No anxiety or depression.   DRUG ALLERGIES:   Allergies  Allergen Reactions  . Benadryl [Diphenhydramine Hcl] Other (See Comments)    Leg jumpy  . Cyclobenzaprine     Other reaction(s): "legs jumpy"  . Motrin [Ibuprofen] Hives  . Ketorolac Tromethamine Rash  . Tramadol Rash    VITALS:  Blood pressure (!) 116/59, pulse 85, temperature 98.5 F (36.9 C), temperature source Oral, resp. rate (!) 24, height 4\' 6"  (1.372 m), weight 57.2 kg (126 lb 3.2 oz), SpO2 97 %.  PHYSICAL EXAMINATION:  VITAL SIGNS: Vitals:   06/20/16 2044 06/21/16 0519  BP: (!) 141/66 (!) 116/59  Pulse: (!) 102 85  Resp: (!) 29 (!) 24  Temp: 98.2 F (36.8 C) 98.5 F (36.9 C)   GENERAL:35 y.o.female currently in no acute distress.  HEAD: Normocephalic, atraumatic.  EYES: Pupils equal, round, reactive to light. Extraocular muscles intact. No scleral icterus.  MOUTH: Moist mucosal membrane. Dentition intact. No abscess  noted.  EAR, NOSE, THROAT: Clear without exudates. No external lesions.  NECK: Supple. No thyromegaly. No nodules. No JVD.  PULMONARY: Diminished breath sounds with scattered rhonchi right greater than left scant wheeze. Tachypneic No use of accessory muscles, Good respiratory effort. good air entry bilaterally CHEST: Nontender to palpation.  CARDIOVASCULAR: S1 and S2. Tachycardic. No murmurs, rubs, or gallops. No edema. Pedal pulses 2+ bilaterally.  GASTROINTESTINAL: Soft, nontender, nondistended. No masses. Positive bowel sounds. No hepatosplenomegaly.  MUSCULOSKELETAL: No swelling, clubbing, or edema. Range of motion full in all extremities.  NEUROLOGIC: Cranial nerves II through XII are intact. No gross focal neurological deficits. Sensation intact. Reflexes intact.  SKIN: No ulceration, lesions, rashes, or cyanosis. Skin warm and dry. Turgor intact.  PSYCHIATRIC: Mood, affect anxious The patient is awake, alert and oriented x 3. Insight, judgment intact.      LABORATORY PANEL:   CBC  Recent Labs Lab 06/20/16 0449  WBC 10.9  HGB 11.4*  HCT 33.6*  PLT 176   ------------------------------------------------------------------------------------------------------------------  Chemistries   Recent Labs Lab 06/17/16 1516  06/20/16 0449  NA 134*  < > 139  K 3.7  < > 4.1  CL 102  < > 104  CO2 23  < > 28  GLUCOSE 125*  < > 143*  BUN 11  < > 20  CREATININE 0.42*  < > 0.46  CALCIUM 8.2*  < > 8.5*  AST 30  --   --   ALT 17  --   --   ALKPHOS 129*  --   --   BILITOT 0.9  --   --   < > =  values in this interval not displayed. ------------------------------------------------------------------------------------------------------------------  Cardiac Enzymes No results for input(s): TROPONINI in the last 168 hours. ------------------------------------------------------------------------------------------------------------------  RADIOLOGY:  Dg Chest Port 1 View  Result Date:  06/20/2016 CLINICAL DATA:  Followup pneumonia EXAM: PORTABLE CHEST 1 VIEW COMPARISON:  06/17/2016 FINDINGS: Cardiac shadow is stable. Patchy infiltrates are again seen bilaterally worse in the right lung base. The overall appearance however has improved slightly in the interval from the prior exam. No bony abnormality is noted. IMPRESSION: Persistent but improved infiltrates bilaterally. Electronically Signed   By: Inez Catalina M.D.   On: 06/20/2016 08:02    EKG:   Orders placed or performed during the hospital encounter of 12/17/15  . ED EKG  . ED EKG    ASSESSMENT AND PLAN:   Demariah Padro is a 35 y.o. female presenting with No chief complaint on file. . Admitted 06/17/2016 : Day #: 4 days 1. Acute respiratory failure with hypoxia: Secondary to asthma exacerbation/pneumonia Continue oxygen, breathing treatments, antibiotics, steroids-Pro calcitonin is within normal limits we will continue remainder of course, respiratory culture sent, add flutter valve     All the records are reviewed and case discussed with Care Management/Social Workerr. Management plans discussed with the patient, family and they are in agreement.  CODE STATUS: full TOTAL TIME TAKING CARE OF THIS PATIENT: 33 minutes.   POSSIBLE D/C IN 1-2DAYS, DEPENDING ON CLINICAL CONDITION.   Hower,  Karenann Cai.D on 06/21/2016 at 11:51 AM  Between 7am to 6pm - Pager - [phone]  After 6pm: House Pager: - [phone]  [facility]  Office  [phone]  CC: Primary care physician; Kasandra Knudsen, NP

## 2016-06-21 NOTE — Consult Note (Signed)
PULMONARY CONSULT NOTE  Requesting MD/Service: Kerry Dory Date of initial consultation: 06/19/16 Reason for consultation: Acute respiratory failure due to CAP. H/O asthma  PT PROFILE: 85 F former smoker with history of asthma diagnosed 2016 admitted 06/17/16 with 6 days of dyspnea, chest pain/tightness, subjective fevers, cough productive of scant purulent sputum and one episode of blood tinged sputum. CXR reveals R>L alveolar infiltrates c/w PNA. Has ben treated with ceftriaxone and azithromycin with subjective improvement  HPI:  As above. She also suffered a prolonged respiratory illness in May/June of this year that was labelled as PNA. She eventually recovered fully from that episode. At her baseline she is rarely limited by DOE. She has been prescribed a Symbicort inhaler which she uses "as needed". She quit smoking 2 yrs ago. She denies significant occupational or environmental exposures. She has no exotic pets and no recent travel. She denies HIV RFs.   SUBJECTIVE: Slight increased WOB Less rhonchi and wheezing today CXR b/l infiltrates better Feels bad, has pleuritic pain   ROS: No new focal weakness or sensory deficits No otalgia, hearing loss, visual changes, nasal and sinus symptoms, mouth and throat problems No neck pain or adenopathy No abdominal pain, N/V/D, diarrhea, change in bowel pattern No dysuria, change in urinary pattern +SOB, -Wheezing+malaise Feels bad   Vitals:   06/20/16 2044 06/21/16 0000 06/21/16 0403 06/21/16 0519  BP: (!) 141/66   (!) 116/59  Pulse: (!) 102   85  Resp: (!) 29   (!) 24  Temp: 98.2 F (36.8 C)   98.5 F (36.9 C)  TempSrc: Oral   Oral  SpO2: 98% 94% 93% 97%  Weight:      Height:         EXAM:  Gen: Appears fatigued, +dyspneic,  HEENT: NCAT, sclera white, oropharynx normal Neck: Supple without LAN, thyromegaly, JVD Lungs: breath sounds: diminished, percussion: normal, scattered rhonchi, few wheezes Cardiovascular: Tachy, reg,,  no murmurs noted Abdomen: Soft, nontender, normal BS Ext: without clubbing, cyanosis, edema Neuro: CNs grossly intact, motor and sensory intact Skin: Limited exam, no lesions noted  DATA:   BMP Latest Ref Rng & Units 06/20/2016 06/18/2016 06/17/2016  Glucose 65 - 99 mg/dL 143(H) 217(H) 125(H)  BUN 6 - 20 mg/dL 20 14 11   Creatinine 0.44 - 1.00 mg/dL 0.46 0.38(L) 0.42(L)  Sodium 135 - 145 mmol/L 139 138 134(L)  Potassium 3.5 - 5.1 mmol/L 4.1 3.7 3.7  Chloride 101 - 111 mmol/L 104 107 102  CO2 22 - 32 mmol/L 28 24 23   Calcium 8.9 - 10.3 mg/dL 8.5(L) 8.6(L) 8.2(L)    CBC Latest Ref Rng & Units 06/20/2016 06/18/2016 06/17/2016  WBC 3.6 - 11.0 K/uL 10.9 4.9 7.3  Hemoglobin 12.0 - 16.0 g/dL 11.4(L) 11.6(L) 12.5  Hematocrit 35.0 - 47.0 % 33.6(L) 34.5(L) 37.0  Platelets 150 - 440 K/uL 176 129(L) 145(L)    CXR:  moderately extensive bilateral R>L alveolar infiltrates  IMPRESSION:   1) Acute hypoxemic respiratory failure 2) Bilateral infiltrates with history compellling for PNA - seems to be responding to Rx for CAP 3) H/O asthma  PLAN:  1) Resp culture has been sent 2)  FLU panel tested as outpatient -Negative 3) change to IV steroids, nebulized steroids and bronchodilators 4) Continue ceftriaxone and azithromycin 5) Pulmonary medicine will follow.  6) Ultimately, after discharge, she will need follow up in 2-3 weeks to ensure CXR resolution and ong term follow up for asthma 7)morphine as needed 8)check ECHO  I have personally obtained  a history, examined the patient, evaluated Pertinent laboratory and RadioGraphic/imaging results, and  formulated the assessment and plan   The Patient requires high complexity decision making for assessment and support, frequent evaluation and titration of therapies.  Patient satisfied with Plan of action and management. All questions answered  Corrin Parker, M.D.  [facility]  Medical Director [place] Director [facility] Cardio-Pulmonary Department

## 2016-06-22 ENCOUNTER — Inpatient Hospital Stay
Admission: AD | Admit: 2016-06-22 | Discharge: 2016-06-22 | Disposition: A | Discharge status: Home / Self Care | Payer: Medicaid Other | Source: Ambulatory Visit | Attending: Internal Medicine | Admitting: Internal Medicine

## 2016-06-22 LAB — ECHOCARDIOGRAM COMPLETE
Height: 54 in
Weight: 2019.2 oz

## 2016-06-22 LAB — GLUCOSE, CAPILLARY
GLUCOSE-CAPILLARY: 204 mg/dL — AB (ref 65–99)
GLUCOSE-CAPILLARY: 134 mg/dL — AB (ref 65–99)
Glucose-Capillary: 156 mg/dL — ABNORMAL HIGH (ref 65–99)
Glucose-Capillary: 148 mg/dL — ABNORMAL HIGH (ref 65–99)

## 2016-06-22 MED ORDER — IPRATROPIUM-ALBUTEROL 0.5-2.5 (3) MG/3ML IN SOLN
3.0000 mL | Freq: Four times a day (QID) | RESPIRATORY_TRACT | Status: DC
  Administered 2016-06-22 – 2016-06-23 (×4): 3 mL via RESPIRATORY_TRACT
  Filled 2016-06-22 (×4): qty 3

## 2016-06-22 MED ORDER — OXYCODONE HCL 5 MG PO TABS
10.0000 mg | ORAL_TABLET | ORAL | Status: DC | PRN
  Administered 2016-06-22 – 2016-06-23 (×5): 10 mg via ORAL
  Filled 2016-06-22 (×5): qty 2

## 2016-06-22 MED ORDER — OXYCODONE HCL 5 MG PO TABS
5.0000 mg | ORAL_TABLET | ORAL | Status: DC | PRN
  Administered 2016-06-22: 5 mg via ORAL
  Filled 2016-06-22: qty 1

## 2016-06-22 MED ORDER — ORAL CARE MOUTH RINSE
15.0000 mL | Freq: Two times a day (BID) | OROMUCOSAL | Status: DC
  Administered 2016-06-22 (×2): 15 mL via OROMUCOSAL

## 2016-06-22 NOTE — Consult Note (Signed)
PULMONARY CONSULT NOTE  Requesting MD/Service: Kerry Dory Date of initial consultation: 06/19/16 Reason for consultation: Acute respiratory failure due to CAP. H/O asthma  PT PROFILE: 37 F former smoker with history of asthma diagnosed 2016 admitted 06/17/16 with 6 days of dyspnea, chest pain/tightness, subjective fevers, cough productive of scant purulent sputum and one episode of blood tinged sputum. CXR reveals R>L alveolar infiltrates c/w PNA. Has ben treated with ceftriaxone and azithromycin with subjective improvement  HPI:  As above. She also suffered a prolonged respiratory illness in May/June of this year that was labelled as PNA. She eventually recovered fully from that episode. At her baseline she is rarely limited by DOE. She has been prescribed a Symbicort inhaler which she uses "as needed". She quit smoking 2 yrs ago. She denies significant occupational or environmental exposures. She has no exotic pets and no recent travel. She denies HIV RFs.   SUBJECTIVE: increased WOB slightly improved today Less rhonchi and wheezing today CXR b/l infiltrates better   ROS: No new focal weakness or sensory deficits No otalgia, hearing loss, visual changes, nasal and sinus symptoms, mouth and throat problems No neck pain or adenopathy No abdominal pain, N/V/D, diarrhea, change in bowel pattern No dysuria, change in urinary pattern +SOB, -Wheezing+malaise Feels bad   Vitals:   06/21/16 2008 06/21/16 2312 06/22/16 0426 06/22/16 0436  BP: 134/67 118/67  127/68  Pulse: 81 86  83  Resp: 16     Temp: 98 F (36.7 C)   97.6 F (36.4 C)  TempSrc: Oral   Oral  SpO2: 98% 98% 94% 96%  Weight:      Height:         EXAM:  Gen: Appears fatigued, +dyspneic,  HEENT: NCAT, sclera white, oropharynx normal Neck: Supple without LAN, thyromegaly, JVD Lungs: breath sounds: diminished, percussion: normal, scattered rhonchi, few wheezes Cardiovascular: Tachy, reg,, no murmurs noted Abdomen:  Soft, nontender, normal BS Ext: without clubbing, cyanosis, edema Neuro: CNs grossly intact, motor and sensory intact Skin: Limited exam, no lesions noted  DATA:   BMP Latest Ref Rng & Units 06/20/2016 06/18/2016 06/17/2016  Glucose 65 - 99 mg/dL 143(H) 217(H) 125(H)  BUN 6 - 20 mg/dL 20 14 11   Creatinine 0.44 - 1.00 mg/dL 0.46 0.38(L) 0.42(L)  Sodium 135 - 145 mmol/L 139 138 134(L)  Potassium 3.5 - 5.1 mmol/L 4.1 3.7 3.7  Chloride 101 - 111 mmol/L 104 107 102  CO2 22 - 32 mmol/L 28 24 23   Calcium 8.9 - 10.3 mg/dL 8.5(L) 8.6(L) 8.2(L)    CBC Latest Ref Rng & Units 06/20/2016 06/18/2016 06/17/2016  WBC 3.6 - 11.0 K/uL 10.9 4.9 7.3  Hemoglobin 12.0 - 16.0 g/dL 11.4(L) 11.6(L) 12.5  Hematocrit 35.0 - 47.0 % 33.6(L) 34.5(L) 37.0  Platelets 150 - 440 K/uL 176 129(L) 145(L)    CXR:  moderately extensive bilateral R>L alveolar infiltrates  IMPRESSION:   1) Acute hypoxemic respiratory failure 2) Bilateral infiltrates with history compellling for PNA - seems to be responding to Rx for CAP 3) H/O asthma  PLAN:  1) Resp culture has been sent 2)  FLU panel tested as outpatient -Negative 3) change to IV steroids, nebulized steroids and bronchodilators 4) Continue ceftriaxone and azithromycin 5) Pulmonary medicine will follow.  6) Ultimately, after discharge, she will need follow up in 2-3 weeks to ensure CXR resolution and ong term follow up for asthma 7)morphine as needed 8)check ECHO-  I have personally obtained a history, examined the patient, evaluated Pertinent laboratory  and RadioGraphic/imaging results, and  formulated the assessment and plan   The Patient requires high complexity decision making for assessment and support, frequent evaluation and titration of therapies.  Patient satisfied with Plan of action and management. All questions answered  Corrin Parker, M.D.  [facility]  Medical Director [place] Director  [facility] Cardio-Pulmonary Department

## 2016-06-22 NOTE — Progress Notes (Signed)
*  PRELIMINARY RESULTS* Echocardiogram 2D Echocardiogram has been performed.  Karen [name] Karen [name] 06/22/2016, 10:27 AM

## 2016-06-22 NOTE — Progress Notes (Signed)
[place] at [place] NAME: Karen [name]    MRN#:  [id number]  DATE OF BIRTH:  04/30/1981  SUBJECTIVE:  Hospital Day: 5 days Karen [name] is a 35 y.o. female presenting with Shortness of breath.   Overnight events: No acute overnight events Interval Events: sleeping, arousable " feeling a little better but not 100%"  REVIEW OF SYSTEMS:  CONSTITUTIONAL: No fever, fatigue or weakness.  EYES: No blurred or double vision.  EARS, NOSE, AND THROAT: No tinnitus or ear pain.  RESPIRATORY: Positive cough, shortness of breath, wheezing denies hemoptysis.  CARDIOVASCULAR: No chest pain, orthopnea, edema.  GASTROINTESTINAL: No nausea, vomiting, diarrhea or abdominal pain.  GENITOURINARY: No dysuria, hematuria.  ENDOCRINE: No polyuria, nocturia,  HEMATOLOGY: No anemia, easy bruising or bleeding SKIN: No rash or lesion. MUSCULOSKELETAL: No joint pain or arthritis.   NEUROLOGIC: No tingling, numbness, weakness.  PSYCHIATRY: No anxiety or depression.   DRUG ALLERGIES:   Allergies  Allergen Reactions  . Benadryl [Diphenhydramine Hcl] Other (See Comments)    Leg jumpy  . Cyclobenzaprine     Other reaction(s): "legs jumpy"  . Motrin [Ibuprofen] Hives  . Ketorolac Tromethamine Rash  . Tramadol Rash    VITALS:  Blood pressure 127/68, pulse 83, temperature 97.6 F (36.4 C), temperature source Oral, resp. rate 16, height 4\' 6"  (1.372 m), weight 57.2 kg (126 lb 3.2 oz), SpO2 96 %.  PHYSICAL EXAMINATION:  VITAL SIGNS: Vitals:   06/21/16 2312 06/22/16 0436  BP: 118/67 127/68  Pulse: 86 83  Resp:    Temp:  97.6 F (36.4 C)   GENERAL:35 y.o.female currently in no acute distress.  HEAD: Normocephalic, atraumatic.  EYES: Pupils equal, round, reactive to light. Extraocular muscles intact. No scleral icterus.  MOUTH: Moist mucosal membrane. Dentition intact. No abscess noted.  EAR, NOSE, THROAT: Clear without exudates. No external lesions.   NECK: Supple. No thyromegaly. No nodules. No JVD.  PULMONARY: Diminished breath sounds with scattered rhonchi right greater than left scant wheeze. Tachypneic No use of accessory muscles, Good respiratory effort. good air entry bilaterally CHEST: Nontender to palpation.  CARDIOVASCULAR: S1 and S2. Tachycardic. No murmurs, rubs, or gallops. No edema. Pedal pulses 2+ bilaterally.  GASTROINTESTINAL: Soft, nontender, nondistended. No masses. Positive bowel sounds. No hepatosplenomegaly.  MUSCULOSKELETAL: No swelling, clubbing, or edema. Range of motion full in all extremities.  NEUROLOGIC: Cranial nerves II through XII are intact. No gross focal neurological deficits. Sensation intact. Reflexes intact.  SKIN: No ulceration, lesions, rashes, or cyanosis. Skin warm and dry. Turgor intact.  PSYCHIATRIC: Mood, affect anxious The patient is awake, alert and oriented x 3. Insight, judgment intact.      LABORATORY PANEL:   CBC  Recent Labs Lab 06/20/16 0449  WBC 10.9  HGB 11.4*  HCT 33.6*  PLT 176   ------------------------------------------------------------------------------------------------------------------  Chemistries   Recent Labs Lab 06/17/16 1516  06/20/16 0449  NA 134*  < > 139  K 3.7  < > 4.1  CL 102  < > 104  CO2 23  < > 28  GLUCOSE 125*  < > 143*  BUN 11  < > 20  CREATININE 0.42*  < > 0.46  CALCIUM 8.2*  < > 8.5*  AST 30  --   --   ALT 17  --   --   ALKPHOS 129*  --   --   BILITOT 0.9  --   --   < > = values in this  interval not displayed. ------------------------------------------------------------------------------------------------------------------  Cardiac Enzymes No results for input(s): TROPONINI in the last 168 hours. ------------------------------------------------------------------------------------------------------------------  RADIOLOGY:  No results found.  EKG:   Orders placed or performed during the hospital encounter of 12/17/15  . ED EKG   . ED EKG    ASSESSMENT AND PLAN:   Karen [name] is a 35 y.o. female presenting with No chief complaint on file. . Admitted 06/17/2016 : Day #: 5 days 1. Acute respiratory failure with hypoxia: Secondary to asthma exacerbation/pneumonia Continue oxygen, breathing treatments, antibiotics, steroids-Pro calcitonin is within normal limits we will continue remainder of course, respiratory culture sent, add flutter valve 2.opioid dependence: history suboxone: limit narcotics    All the records are reviewed and case discussed with Care Management/Social Workerr. Management plans discussed with the patient, family and they are in agreement.  CODE STATUS: full TOTAL TIME TAKING CARE OF THIS PATIENT: 28 minutes.   POSSIBLE D/C IN 1-2DAYS, DEPENDING ON CLINICAL CONDITION.   Hower,  Karenann Cai.D on 06/22/2016 at 12:04 PM  Between 7am to 6pm - Pager - [phone]  After 6pm: House Pager: - [phone]  [facility]  Office  [phone]  CC: Primary care physician; Kasandra Knudsen, NP

## 2016-06-23 LAB — GLUCOSE, CAPILLARY: Glucose-Capillary: 186 mg/dL — ABNORMAL HIGH (ref 65–99)

## 2016-06-23 MED ORDER — HYDROCOD POLST-CPM POLST ER 10-8 MG/5ML PO SUER: 5.0000 mL | Freq: Two times a day (BID) | ORAL | 0 refills | Status: DC

## 2016-06-23 MED ORDER — GUAIFENESIN ER 600 MG PO TB12: 600.0000 mg | ORAL_TABLET | Freq: Two times a day (BID) | ORAL | 0 refills | Status: DC

## 2016-06-23 MED ORDER — PREDNISONE 10 MG PO TABS
ORAL_TABLET | ORAL | 0 refills | Status: DC
Start: 2016-06-23 — End: 2016-07-04

## 2016-06-23 NOTE — Discharge Summary (Signed)
[place] at [place] NAME: Karen [name]    MR#:  [id number]  DATE OF BIRTH:  1980-12-31  DATE OF ADMISSION:  06/17/2016 ADMITTING PHYSICIAN: Loletha Grayer, MD  DATE OF DISCHARGE: 06/23/16  PRIMARY CARE PHYSICIAN: HOLLAND, CHELSA, NP    ADMISSION DIAGNOSIS:  Asthma exacerbation hypoxia  DISCHARGE DIAGNOSIS:  Principal Problem:   Asthma with acute exacerbation Active Problems:   Asthma exacerbation   SECONDARY DIAGNOSIS:   Past Medical History:  Diagnosis Date  . Allergy   . Anxiety   . Asthma   . GERD (gastroesophageal reflux disease)   . Internal hemorrhoids   . Short of breath on exertion    PT STATES SHE CAN WALK A MILE AT HER OWN PACE WITH OUT GETTING SOB    HOSPITAL COURSE:  Karen [name]  is a 35 y.o. female admitted 06/17/2016 with chief complaint shortness of breath. Please see H&P performed by Loletha Grayer, MD for further information. Patient presented with the above symptoms. She made slow but continued improvement with steroids, breathing treatments, antibiotics. Evaluated by pulmonary while inpatient who assisted in medication adjustment  DISCHARGE CONDITIONS:   stable  CONSULTS OBTAINED:  Treatment Team:  Flora Lipps, MD  DRUG ALLERGIES:   Allergies  Allergen Reactions  . Benadryl [Diphenhydramine Hcl] Other (See Comments)    Leg jumpy  . Cyclobenzaprine     Other reaction(s): "legs jumpy"  . Motrin [Ibuprofen] Hives  . Ketorolac Tromethamine Rash  . Tramadol Rash    DISCHARGE MEDICATIONS:   Current Discharge Medication List    START taking these medications   Details  chlorpheniramine-HYDROcodone (TUSSIONEX) 10-8 MG/5ML SUER Take 5 mLs by mouth every 12 (twelve) hours. Qty: 140 mL, Refills: 0    guaiFENesin (MUCINEX) 600 MG 12 hr tablet Take 1 tablet (600 mg total) by mouth 2 (two) times daily. Qty: 28 tablet, Refills: 0    predniSONE (DELTASONE) 10 MG tablet 40mg  x1 day, 20mg x2  day,10mg x2 day then stop Qty: 10 tablet, Refills: 0      CONTINUE these medications which have NOT CHANGED   Details  budesonide-formoterol (SYMBICORT) 160-4.5 MCG/ACT inhaler Inhale 2 puffs into the lungs 2 (two) times daily.    cetirizine (ZYRTEC) 10 MG tablet Take 10 mg by mouth daily. Refills: 2    fluticasone (FLONASE) 50 MCG/ACT nasal spray USE 2 SPRAYS DAILY AS DIRECTED Refills: 2    pantoprazole (PROTONIX) 40 MG tablet Take 40 mg by mouth every morning. Refills: 1    SUBOXONE 8-2 MG FILM DISSOLVE 1 FILM UNDER TONGUE EVERY 12 HOURS Refills: 0    albuterol (PROVENTIL HFA;VENTOLIN HFA) 108 (90 Base) MCG/ACT inhaler Inhale 2 puffs into the lungs every 6 (six) hours as needed for wheezing or shortness of breath. Qty: 1 Inhaler, Refills: 2    medroxyPROGESTERone (DEPO-PROVERA) 150 MG/ML injection INJECT AS DIRECTED INTRAMUSCULARLY EVERY 3 MONTHS Refills: 1      STOP taking these medications     benzonatate (TESSALON PERLES) 100 MG capsule      promethazine (PHENERGAN) 12.5 MG tablet          DISCHARGE INSTRUCTIONS:    DIET:  Regular diet  DISCHARGE CONDITION:  Stable  ACTIVITY:  Activity as tolerated  OXYGEN:  Home Oxygen: No.   Oxygen Delivery: room air  DISCHARGE LOCATION:  home   If you experience worsening of your admission symptoms, develop shortness of breath, life threatening emergency, suicidal or homicidal thoughts you must seek  medical attention immediately by calling 911 or calling your MD immediately  if symptoms less severe.  You Must read complete instructions/literature along with all the possible adverse reactions/side effects for all the Medicines you take and that have been prescribed to you. Take any new Medicines after you have completely understood and accpet all the possible adverse reactions/side effects.   Please note  You were cared for by a hospitalist during your hospital stay. If you have any questions about your discharge  medications or the care you received while you were in the hospital after you are discharged, you can call the unit and asked to speak with the hospitalist on call if the hospitalist that took care of you is not available. Once you are discharged, your primary care physician will handle any further medical issues. Please note that NO REFILLS for any discharge medications will be authorized once you are discharged, as it is imperative that you return to your primary care physician (or establish a relationship with a primary care physician if you do not have one) for your aftercare needs so that they can reassess your need for medications and monitor your lab values.    On the day of Discharge:   VITAL SIGNS:  Blood pressure 110/69, pulse 79, temperature 97.8 F (36.6 C), temperature source Oral, resp. rate 20, height 4\' 6"  (1.372 m), weight 57.2 kg (126 lb 3.2 oz), SpO2 95 %.  I/O:   Intake/Output Summary (Last 24 hours) at 06/23/16 1002 Last data filed at 06/23/16 0958  Gross per 24 hour  Intake              480 ml  Output                0 ml  Net              480 ml    PHYSICAL EXAMINATION:  GENERAL:  35 y.o.-year-old patient lying in the bed with no acute distress.  EYES: Pupils equal, round, reactive to light and accommodation. No scleral icterus. Extraocular muscles intact.  HEENT: Head atraumatic, normocephalic. Oropharynx and nasopharynx clear.  NECK:  Supple, no jugular venous distention. No thyroid enlargement, no tenderness.  LUNGS: scant coarse breath soundsno wheezing,No use of accessory muscles of respiration.  CARDIOVASCULAR: S1, S2 normal. No murmurs, rubs, or gallops.  ABDOMEN: Soft, non-tender, non-distended. Bowel sounds present. No organomegaly or mass.  EXTREMITIES: No pedal edema, cyanosis, or clubbing.  NEUROLOGIC: Cranial nerves II through XII are intact. Muscle strength 5/5 in all extremities. Sensation intact. Gait not checked.  PSYCHIATRIC: The patient is alert  and oriented x 3.  SKIN: No obvious rash, lesion, or ulcer.   DATA REVIEW:   CBC  Recent Labs Lab 06/20/16 0449  WBC 10.9  HGB 11.4*  HCT 33.6*  PLT 176    Chemistries   Recent Labs Lab 06/17/16 1516  06/20/16 0449  NA 134*  < > 139  K 3.7  < > 4.1  CL 102  < > 104  CO2 23  < > 28  GLUCOSE 125*  < > 143*  BUN 11  < > 20  CREATININE 0.42*  < > 0.46  CALCIUM 8.2*  < > 8.5*  AST 30  --   --   ALT 17  --   --   ALKPHOS 129*  --   --   BILITOT 0.9  --   --   < > = values in this interval not  displayed.  Cardiac Enzymes No results for input(s): TROPONINI in the last 168 hours.  Microbiology Results  Results for orders placed or performed during the hospital encounter of 01/01/16  MRSA PCR Screening     Status: None   Collection Time: 01/01/16  4:05 PM  Result Value Ref Range Status   MRSA by PCR NEGATIVE NEGATIVE Final    Comment:        The GeneXpert MRSA Assay (FDA approved for NASAL specimens only), is one component of a comprehensive MRSA colonization surveillance program. It is not intended to diagnose MRSA infection nor to guide or monitor treatment for MRSA infections.     RADIOLOGY:  No results found.   Management plans discussed with the patient, family and they are in agreement.  CODE STATUS:     Code Status Orders        Start     Ordered   06/17/16 1407  Full code  Continuous     06/17/16 1406    Code Status History    Date Active Date Inactive Code Status Order ID Comments User Context   01/01/2016 12:50 PM 01/03/2016  2:47 PM Full Code [id number]  Gladstone Lighter, MD Inpatient      TOTAL TIME TAKING CARE OF THIS PATIENT: 33 minutes.    Hower,  Karenann Cai.D on 06/23/2016 at 10:02 AM  Between 7am to 6pm - Pager - [phone]  After 6pm go to [email or web address] - [facility] [employer] [place]  Office  [phone]  CC: Primary care physician; Kasandra Knudsen, NP

## 2016-06-23 NOTE — Progress Notes (Signed)
Pt is being discharged today, IV was removed, pt belongings packed and returned to patient. Discharge instructions given to patient. She verified understanding. 2 paper prescriptions given to her. She was rolled out in wheelchair by staff.

## 2016-06-23 NOTE — Progress Notes (Signed)
PULMONARY CONSULT NOTE  Requesting MD/Service: Kerry Dory Date of initial consultation: 06/19/16 Reason for consultation: Acute respiratory failure due to CAP. H/O asthma      SUBJECTIVE: Breathing feeling better today.  Less rhonchi and wheezing today CXR b/l infiltrates better   ROS: No new focal weakness or sensory deficits No otalgia, hearing loss, visual changes, nasal and sinus symptoms, mouth and throat problems No neck pain or adenopathy No abdominal pain, N/V/D, diarrhea, change in bowel pattern No dysuria, change in urinary pattern +SOB, -Wheezing+malaise Feels bad   Vitals:   06/22/16 2054 06/23/16 0537 06/23/16 1040 06/23/16 1055  BP:  110/69    Pulse:  79    Resp:  20    Temp:  97.8 F (36.6 C)    TempSrc:  Oral    SpO2: 93% 95% 100% 100%  Weight:      Height:         EXAM:  Gen: Appears fatigued, +dyspneic,  HEENT: NCAT, sclera white, oropharynx normal Neck: Supple without LAN, thyromegaly, JVD Lungs: breath sounds: diminished, percussion: normal, scattered rhonchi,  Cardiovascular: Tachy, reg,, no murmurs noted Abdomen: Soft, nontender, normal BS Ext: without clubbing, cyanosis, edema Neuro: CNs grossly intact, motor and sensory intact Skin: Limited exam, no lesions noted  DATA:   BMP Latest Ref Rng & Units 06/20/2016 06/18/2016 06/17/2016  Glucose 65 - 99 mg/dL 143(H) 217(H) 125(H)  BUN 6 - 20 mg/dL 20 14 11   Creatinine 0.44 - 1.00 mg/dL 0.46 0.38(L) 0.42(L)  Sodium 135 - 145 mmol/L 139 138 134(L)  Potassium 3.5 - 5.1 mmol/L 4.1 3.7 3.7  Chloride 101 - 111 mmol/L 104 107 102  CO2 22 - 32 mmol/L 28 24 23   Calcium 8.9 - 10.3 mg/dL 8.5(L) 8.6(L) 8.2(L)    CBC Latest Ref Rng & Units 06/20/2016 06/18/2016 06/17/2016  WBC 3.6 - 11.0 K/uL 10.9 4.9 7.3  Hemoglobin 12.0 - 16.0 g/dL 11.4(L) 11.6(L) 12.5  Hematocrit 35.0 - 47.0 % 33.6(L) 34.5(L) 37.0  Platelets 150 - 440 K/uL 176 129(L) 145(L)    CXR:  moderately extensive bilateral R>L  alveolar infiltrates  IMPRESSION:   1) Acute hypoxemic respiratory failure 2) Bilateral infiltrates with history compellling for PNA - seems to be responding to Rx for CAP 3) H/O asthma  PLAN:  Continue steroid taper, has completed 1 week of abx. OK to discharge from respiratory standpoint, our office will be calling patient to arrange outpatient follow up with Dr. Mortimer Fries.   Marda Stalker, M.D.  06/23/2016

## 2016-06-24 ENCOUNTER — Other Ambulatory Visit: Payer: Self-pay | Admitting: *Deleted

## 2016-06-24 DIAGNOSIS — J189 Pneumonia, unspecified organism: Secondary | ICD-10-CM

## 2016-06-27 ENCOUNTER — Ambulatory Visit (INDEPENDENT_AMBULATORY_CARE_PROVIDER_SITE_OTHER): Payer: Medicaid Other | Admitting: Pulmonary Disease

## 2016-06-27 ENCOUNTER — Ambulatory Visit
Admission: RE | Admit: 2016-06-27 | Discharge: 2016-06-27 | Disposition: A | Discharge status: Home / Self Care | Payer: Medicaid Other | Source: Ambulatory Visit | Attending: Internal Medicine | Admitting: Internal Medicine

## 2016-06-27 ENCOUNTER — Encounter: Payer: Self-pay | Admitting: Pulmonary Disease

## 2016-06-27 ENCOUNTER — Ambulatory Visit
Admission: RE | Admit: 2016-06-27 | Discharge: 2016-06-27 | Disposition: A | Discharge status: Home / Self Care | Payer: Medicaid Other | Source: Ambulatory Visit | Attending: Pulmonary Disease | Admitting: Pulmonary Disease

## 2016-06-27 VITALS — BP 118/80 | HR 110

## 2016-06-27 DIAGNOSIS — J189 Pneumonia, unspecified organism: Secondary | ICD-10-CM

## 2016-06-27 DIAGNOSIS — J4521 Mild intermittent asthma with (acute) exacerbation: Secondary | ICD-10-CM | POA: Insufficient documentation

## 2016-06-27 DIAGNOSIS — J4530 Mild persistent asthma, uncomplicated: Secondary | ICD-10-CM

## 2016-06-27 DIAGNOSIS — F1190 Opioid use, unspecified, uncomplicated: Secondary | ICD-10-CM | POA: Insufficient documentation

## 2016-06-27 DIAGNOSIS — J4520 Mild intermittent asthma, uncomplicated: Secondary | ICD-10-CM | POA: Insufficient documentation

## 2016-06-27 NOTE — Patient Instructions (Signed)
Complete prednisone course as prescribed Continue Symbicort inhaler as your controller medication Continue albuterol inhaler as your rescue medication Follow up with me in 8 weeks

## 2016-06-29 NOTE — Progress Notes (Signed)
PULMONARY POST HOSPITAL FOLLOW UP  PT PROFILE: 35 y.o. F former smoker with asthma hospitalized 11/28-12/04/17 with acute hypoxic respiratory failure, bilateral pulmonary infiltrates thought to represent CAP and acute bronchospasm. Seen in consultation by Pulmonary Medicine during that visit. Improved with antibiotics and steroids. Also has history of similar illness in May 2017 but pulmonary infiltrates were minimal and only seen on CT chest  PROBLEMS: Mild persistent asthma CAP 05/2016  INTERVAL HISTORY: No major events since discharge  SUBJ: Feels much better. Believes she is 80% back to her baseline. Denies CP, fever, purulent sputum, hemoptysis, LE edema and calf tenderness  OBJ: Vitals:   06/27/16 1057  BP: 118/80  Pulse: (!) 110  SpO2: 95%    Gen: WDWN in NAD HEENT: All WNL Neck: NO LAN, no JVD noted Lungs: full BS, normal percussion note throughout, no wheezes Cardiovascular: Reg rate, normal rhythm, no M noted Abdomen: Soft, NT +BS Ext: no C/C/E Neuro: CNs intact, motor/sens grossly intact Skin: No lesions noted   DATA: CXR (06/27/16): complete clearing of previously seen bilateral pulmonary infiltrates. NACPD  IMPRESSION: Community acquired pneumonia, unspecified laterality  Mild persistent asthma without complication  1) Mild persistent asthma - no acute bronchospasm.  2) Recent CAP - radiographically resolved and clinically resolving  PLAN: 1) Complete prednisone course as prescribed 2) Continue Symbicort inhaler as controller medication 3) Continue albuterol inhaler as rescue medication 4) Office follow up in 8 weeks  Merton Border, MD PCCM service Mobile [phone] Pager [phone] 06/29/2016

## 2016-07-01 ENCOUNTER — Telehealth: Payer: Self-pay | Admitting: Pulmonary Disease

## 2016-07-01 NOTE — Telephone Encounter (Signed)
Pt states she started to feel better, but has started to feel sick again, in her chest and back. Please call.

## 2016-07-02 NOTE — Telephone Encounter (Signed)
ATC-no vm set up Wcb

## 2016-07-03 NOTE — Telephone Encounter (Signed)
Pt returning our call  ° °

## 2016-07-03 NOTE — Telephone Encounter (Signed)
Z pack and prednisone taper.   Prednisone 10 mg tabs x 21.  Take 6 tablets on day 1 Take 5 tablets on day 2 Take 4 tablets on day 3 Take 3 tablets on day 4 Take 2 tablets on day 5 Take 1 tablet on day 6 then stop.

## 2016-07-03 NOTE — Telephone Encounter (Signed)
Pt seen DS in office on 06/27/16, pt finished prednisone on 07-01-16. Pt states she noticed an improvement for a few days. Pt now having having trouble taking a deep breathe, can't eat or drink without chest discomfort & prod cough with clear mucus. Pt denies any fever, chills or sweats. Pt is concerned as this is exactly how she felt when she had PNA previously and had to be admitted.  DR please advise. Thanks.

## 2016-07-03 NOTE — Telephone Encounter (Signed)
LMTCB

## 2016-07-04 ENCOUNTER — Ambulatory Visit
Admission: EM | Admit: 2016-07-04 | Discharge: 2016-07-04 | Disposition: A | Discharge status: Home / Self Care | Payer: Medicaid Other | Attending: Family Medicine | Admitting: Family Medicine

## 2016-07-04 ENCOUNTER — Encounter: Payer: Self-pay | Admitting: Emergency Medicine

## 2016-07-04 ENCOUNTER — Ambulatory Visit: Payer: Medicaid Other

## 2016-07-04 DIAGNOSIS — J4530 Mild persistent asthma, uncomplicated: Secondary | ICD-10-CM | POA: Diagnosis present

## 2016-07-04 DIAGNOSIS — K219 Gastro-esophageal reflux disease without esophagitis: Secondary | ICD-10-CM | POA: Diagnosis not present

## 2016-07-04 DIAGNOSIS — Z87891 Personal history of nicotine dependence: Secondary | ICD-10-CM | POA: Insufficient documentation

## 2016-07-04 DIAGNOSIS — R0781 Pleurodynia: Secondary | ICD-10-CM | POA: Insufficient documentation

## 2016-07-04 DIAGNOSIS — Z9889 Other specified postprocedural states: Secondary | ICD-10-CM | POA: Insufficient documentation

## 2016-07-04 DIAGNOSIS — Z79899 Other long term (current) drug therapy: Secondary | ICD-10-CM | POA: Diagnosis not present

## 2016-07-04 LAB — FIBRIN DERIVATIVES D-DIMER (ARMC ONLY): FIBRIN DERIVATIVES D-DIMER (ARMC): 348 (ref 0–499)

## 2016-07-04 NOTE — ED Provider Notes (Signed)
[facility]    CSN: [id number] Arrival date & time: 07/04/16  1740     History   Chief Complaint Chief Complaint  Patient presents with  . Shortness of Breath    HPI Karen [name] is a 35 y.o. female.   35 yo female with a recent diagnosis of pneumonia presents with a c/o 4 days of "lung pain" when taking deep breaths and pain with swallowing. States also has a h/o gerd. Denies any fevers, chills, cough, calf pain.    The history is provided by the patient.    Past Medical History:  Diagnosis Date  . Allergy   . Anxiety   . Asthma   . GERD (gastroesophageal reflux disease)   . Internal hemorrhoids     Patient Active Problem List   Diagnosis Date Noted  . Mild persistent asthma 06/27/2016  . Chronic, continuous use of opioids 06/27/2016  . Epigastric hernia 08/24/2015    Past Surgical History:  Procedure Laterality Date  . CARPAL TUNNEL RELEASE Bilateral 2010  . CESAREAN SECTION  2009  . COLONOSCOPY    . EPIGASTRIC HERNIA REPAIR N/A 09/03/2015   Procedure: HERNIA REPAIR EPIGASTRIC ADULT;  Surgeon: Robert Bellow, MD;  Location: [facility] ORS;  Service: General;  Laterality: N/A;  . HIP SURGERY  2013  . OVARIAN CYST SURGERY  2002   [facility] [place]  . TONSILLECTOMY  2010    OB History    Gravida Para Term Preterm AB Living   1 1           SAB TAB Ectopic Multiple Live Births                  Obstetric Comments   1st Menstrual Cycle:  10  1st Pregnancy:  26        Home Medications    Prior to Admission medications   Medication Sig Start Date End Date Taking? Authorizing Provider  albuterol (PROVENTIL HFA;VENTOLIN HFA) 108 (90 Base) MCG/ACT inhaler Inhale 2 puffs into the lungs every 6 (six) hours as needed for wheezing or shortness of breath. 12/17/15   Harvest Dark, MD  budesonide-formoterol [facility]) 160-4.5 MCG/ACT inhaler Inhale 2 puffs into the lungs 2 (two) times daily.    Historical Provider, MD  cetirizine (ZYRTEC) 10  MG tablet Take 10 mg by mouth daily. 08/18/15   Historical Provider, MD  fluticasone (FLONASE) 50 MCG/ACT nasal spray USE 2 SPRAYS DAILY AS DIRECTED 08/18/15   Historical Provider, MD  medroxyPROGESTERone (DEPO-PROVERA) 150 MG/ML injection INJECT AS DIRECTED INTRAMUSCULARLY EVERY 3 MONTHS 07/30/15   Historical Provider, MD  pantoprazole (PROTONIX) 40 MG tablet Take 40 mg by mouth every morning. 07/09/15   Historical Provider, MD  SUBOXONE 8-2 MG FILM DISSOLVE 1 FILM UNDER TONGUE EVERY 12 HOURS 04/21/16   Historical Provider, MD    Family History Family History  Problem Relation Age of Onset  . COPD Mother   . COPD Father     Social History Social History  Substance Use Topics  . Smoking status: Former Smoker    Packs/day: 0.50    Years: 18.00    Types: Cigarettes    Quit date: 07/21/2014  . Smokeless tobacco: Never Used  . Alcohol use No     Allergies   Benadryl [diphenhydramine hcl]; Cyclobenzaprine; Motrin [ibuprofen]; Ketorolac tromethamine; and Tramadol   Review of Systems Review of Systems   Physical Exam Triage Vital Signs ED Triage Vitals  Enc Vitals Group  BP 07/04/16 1759 124/65     Pulse Rate 07/04/16 1759 (!) 115     Resp 07/04/16 1759 18     Temp 07/04/16 1759 98.8 F (37.1 C)     Temp Source 07/04/16 1759 Oral     SpO2 07/04/16 1759 100 %     Weight 07/04/16 1759 110 lb (49.9 kg)     Height 07/04/16 1759 4\' 6"  (1.372 m)     Head Circumference --      Peak Flow --      Pain Score 07/04/16 1801 8     Pain Loc --      Pain Edu? --      Excl. in [place]? --    No data found.   Updated Vital Signs BP 124/65   Pulse (!) 115   Temp 98.8 F (37.1 C) (Oral)   Resp 18   Ht 4\' 6"  (1.372 m)   Wt 110 lb (49.9 kg)   LMP 06/30/2016   SpO2 100%   BMI 26.52 kg/m   Visual Acuity Right Eye Distance:   Left Eye Distance:   Bilateral Distance:    Right Eye Near:   Left Eye Near:    Bilateral Near:     Physical Exam  Constitutional: She appears  well-developed and well-nourished. No distress.  HENT:  Head: Normocephalic and atraumatic.  Right Ear: Tympanic membrane, external ear and ear canal normal.  Left Ear: Tympanic membrane, external ear and ear canal normal.  Nose: Mucosal edema and rhinorrhea present. No nose lacerations, sinus tenderness, nasal deformity, septal deviation or nasal septal hematoma. No epistaxis.  No foreign bodies. Right sinus exhibits maxillary sinus tenderness and frontal sinus tenderness. Left sinus exhibits maxillary sinus tenderness and frontal sinus tenderness.  Mouth/Throat: Uvula is midline, oropharynx is clear and moist and mucous membranes are normal. No oropharyngeal exudate.  Eyes: Conjunctivae and EOM are normal. Pupils are equal, round, and reactive to light. Right eye exhibits no discharge. Left eye exhibits no discharge. No scleral icterus.  Neck: Normal range of motion. Neck supple. No thyromegaly present.  Cardiovascular: Normal rate, regular rhythm and normal heart sounds.   Pulmonary/Chest: Effort normal and breath sounds normal. No respiratory distress. She has no wheezes. She has no rales.  Lymphadenopathy:    She has no cervical adenopathy.  Skin: She is not diaphoretic.  Nursing note and vitals reviewed.    UC Treatments / Results  Labs (all labs ordered are listed, but only abnormal results are displayed) Labs Reviewed  FIBRIN DERIVATIVES D-DIMER ([place])    EKG  EKG Interpretation None       Radiology Dg Chest 2 View  Result Date: 07/04/2016 CLINICAL DATA:  Continued shortness of breath. Hospitalized for pneumonia 2 weeks ago. EXAM: CHEST  2 VIEW COMPARISON:  06/27/2016. FINDINGS: The heart size and mediastinal contours are within normal limits. Both lungs are clear. The visualized skeletal structures are unremarkable. IMPRESSION: Normal examination. Electronically Signed   By: Claudie Revering M.D.   On: 07/04/2016 18:38    Procedures .EKG Date/Time: 07/04/2016 9:28  PM Performed by: Norval Gable Authorized by: Norval Gable   ECG reviewed by ED Physician in the absence of a cardiologist: yes   Previous ECG:    Previous ECG:  Unavailable Interpretation:    Interpretation: normal   Rate:    ECG rate:  100   ECG rate assessment: tachycardic   Rhythm:    Rhythm: sinus rhythm   Ectopy:  Ectopy: none   QRS:    QRS axis:  Normal Conduction:    Conduction: normal   ST segments:    ST segments:  Normal T waves:    T waves: normal     (including critical care time)  Medications Ordered in UC Medications - No data to display   Initial Impression / Assessment and Plan / UC Course  I have reviewed the triage vital signs and the nursing notes.  Pertinent labs & imaging results that were available during my care of the patient were reviewed by me and considered in my medical decision making (see chart for details).  Clinical Course       Final Clinical Impressions(s) / UC Diagnoses   Final diagnoses:  Gastroesophageal reflux disease, esophagitis presence not specified  Pleuritic chest pain    New Prescriptions Discharge Medication List as of 07/04/2016  7:55 PM     1. Labs/x-ray results and diagnosis reviewed with patient 2. rx as per orders above; reviewed possible side effects, interactions, risks and benefits  3. Recommend supportive treatment with otc acetaminophen prn 4. Follow-up prn if symptoms worsen or don't improve   Norval Gable, MD 07/04/16 2133

## 2016-07-04 NOTE — Discharge Instructions (Signed)
Add zantac or pepcid over the counter Tylenol as needed

## 2016-07-04 NOTE — Telephone Encounter (Signed)
Spoke patient, very upset that she has not back from our office. States that she is in a lot of pain and needed to be seen today. Pt c/o sharp severe pain between her shoulder blades, pain with breathing and sore throat. Pt states that she was given the rec's from Dr Ashby Dawes but wanted Dr Alva Garnet recommendations on if she needed to take only the Prednisone or if she needed to be seen. Pt states that previous recommendations were that she needed to be seen asap the next time she started having these similar symptoms. I apologized for the inconvenience and for the delay in getting back with her. I explained that Dr Alva Garnet is not in clinic this afternoon and that there are no Physicians or nurses out in the [place] office this afternoon either. Pt advised that I we have sent the message to Dr Alva Garnet for his further rec's but have not heard back. Pt advised that given her current symptoms and the pain she is having in her back she really needs to go on to an UC or the ED to be evaluated as she may need a CXR. Aware that we will follow up on this message and will call her if Dr Alva Garnet has any further recommendations.   Will send to Dr Alva Garnet to make aware of patient phone call and also to make aware of rec's given by triage nurses.  --------- There is no documentation on whether or not Dr Alva Garnet was called/paged to be made aware of the [place] [facility] before he left for the day no was it marked as urgent.

## 2016-07-04 NOTE — Telephone Encounter (Signed)
Dr. Alva Garnet  Please Advise  This pt. Called the [place] office it looks like and a sick message was placed yesterday and Dr. Rockne Menghini gave his recommendations for the pt.(see below) Now the pt. Is unsure if she should just come in and be seen because she is having pain or should she take the medication. She wanted to know what would you recommend.   Margie A Blankenship, CMA      1:44 PM  Note    Pt seen DS in office on 06/27/16, pt finished prednisone on 07-01-16. Pt states she noticed an improvement for a few days. Pt now having having trouble taking a deep breathe, can't eat or drink without chest discomfort & prod cough with clear mucus. Pt denies any fever, chills or sweats. Pt is concerned as this is exactly how she felt when she had PNA previously and had to be admitted.  DR please advise. Thanks.      Laverle Hobby, MD  to Shon Hale, CMA     1:54 PM  Note    Z pack and prednisone taper.   Prednisone 10 mg tabs x 21.  Take 6 tablets on day 1 Take 5 tablets on day 2 Take 4 tablets on day 3 Take 3 tablets on day 4 Take 2 tablets on day 5 Take 1 tablet on day 6 then stop.

## 2016-07-04 NOTE — Telephone Encounter (Signed)
Lmtcb. Will await call back 

## 2016-07-04 NOTE — Telephone Encounter (Signed)
Pt returning call to check on status of request.Karen [name] ° °

## 2016-07-04 NOTE — ED Triage Notes (Signed)
Patient states that she was diagnosed with Pneumonia 2 weeks ago and was d/c from [facility] on 06/24/16.  Patient c/o difficulty with SOB on Monday.

## 2016-07-08 NOTE — Telephone Encounter (Signed)
Per DS nothing further needed.

## 2016-07-08 NOTE — Telephone Encounter (Signed)
I called patient and reviewed CXR, documentation from ED encounter. This does not appear to be a pulmonary problem. No further eval or interventions from my perspective  Waunita Schooner

## 2016-08-29 ENCOUNTER — Ambulatory Visit: Payer: Medicaid Other | Admitting: Pulmonary Disease

## 2016-09-15 ENCOUNTER — Ambulatory Visit: Payer: Medicaid Other | Admitting: Pulmonary Disease

## 2016-09-27 ENCOUNTER — Ambulatory Visit
Admission: EM | Admit: 2016-09-27 | Discharge: 2016-09-27 | Disposition: A | Discharge status: Other Acute Inpatient Hospital | Payer: Medicaid Other | Attending: Family Medicine | Admitting: Family Medicine

## 2016-09-27 ENCOUNTER — Ambulatory Visit: Payer: Medicaid Other

## 2016-09-27 ENCOUNTER — Encounter: Payer: Self-pay | Admitting: Emergency Medicine

## 2016-09-27 DIAGNOSIS — Z79891 Long term (current) use of opiate analgesic: Secondary | ICD-10-CM | POA: Diagnosis not present

## 2016-09-27 DIAGNOSIS — R509 Fever, unspecified: Secondary | ICD-10-CM | POA: Insufficient documentation

## 2016-09-27 DIAGNOSIS — Z79899 Other long term (current) drug therapy: Secondary | ICD-10-CM | POA: Diagnosis not present

## 2016-09-27 DIAGNOSIS — Z87891 Personal history of nicotine dependence: Secondary | ICD-10-CM | POA: Insufficient documentation

## 2016-09-27 DIAGNOSIS — Z886 Allergy status to analgesic agent status: Secondary | ICD-10-CM | POA: Insufficient documentation

## 2016-09-27 DIAGNOSIS — J4541 Moderate persistent asthma with (acute) exacerbation: Secondary | ICD-10-CM | POA: Diagnosis not present

## 2016-09-27 MED ORDER — IPRATROPIUM-ALBUTEROL 0.5-2.5 (3) MG/3ML IN SOLN
3.0000 mL | Freq: Once | RESPIRATORY_TRACT | Status: AC
  Administered 2016-09-27: 3 mL via RESPIRATORY_TRACT

## 2016-09-27 MED ORDER — METHYLPREDNISOLONE SODIUM SUCC 125 MG IJ SOLR
125.0000 mg | Freq: Once | INTRAMUSCULAR | Status: AC
  Administered 2016-09-27: 125 mg via INTRAMUSCULAR

## 2016-09-27 NOTE — ED Provider Notes (Signed)
[facility]    CSN: [id number] Arrival date & time: 09/27/16  1410     History   Chief Complaint Chief Complaint  Patient presents with  . Cough  . Fever    HPI Karen [name] is a 36 y.o. female.   The history is provided by the patient.  Cough  Associated symptoms: fever and wheezing   Fever  Associated symptoms: congestion and cough   URI  Presenting symptoms: congestion, cough, fatigue and fever   Severity:  Moderate Onset quality:  Sudden Duration:  1 week Timing:  Constant Progression:  Worsening Chronicity:  New Relieved by:  Nothing Ineffective treatments:  Prescription medications (inhalers) Associated symptoms: wheezing   Risk factors: chronic respiratory disease (chronic, persistent asthma) and sick contacts   Risk factors: not elderly, no chronic cardiac disease, no chronic kidney disease, no diabetes mellitus, no immunosuppression, no recent illness and no recent travel     Past Medical History:  Diagnosis Date  . Allergy   . Anxiety   . Asthma   . GERD (gastroesophageal reflux disease)   . Internal hemorrhoids     Patient Active Problem List   Diagnosis Date Noted  . Mild persistent asthma 06/27/2016  . Chronic, continuous use of opioids 06/27/2016  . Epigastric hernia 08/24/2015    Past Surgical History:  Procedure Laterality Date  . CARPAL TUNNEL RELEASE Bilateral 2010  . CESAREAN SECTION  2009  . COLONOSCOPY    . EPIGASTRIC HERNIA REPAIR N/A 09/03/2015   Procedure: HERNIA REPAIR EPIGASTRIC ADULT;  Surgeon: Robert Bellow, MD;  Location: [facility] ORS;  Service: General;  Laterality: N/A;  . HIP SURGERY  2013  . OVARIAN CYST SURGERY  2002   [facility] [place]  . TONSILLECTOMY  2010    OB History    Gravida Para Term Preterm AB Living   1 1           SAB TAB Ectopic Multiple Live Births                  Obstetric Comments   1st Menstrual Cycle:  10  1st Pregnancy:  26        Home Medications    Prior to  Admission medications   Medication Sig Start Date End Date Taking? Authorizing Provider  albuterol (PROVENTIL HFA;VENTOLIN HFA) 108 (90 Base) MCG/ACT inhaler Inhale 2 puffs into the lungs every 6 (six) hours as needed for wheezing or shortness of breath. 12/17/15   Harvest Dark, MD  budesonide-formoterol [facility]) 160-4.5 MCG/ACT inhaler Inhale 2 puffs into the lungs 2 (two) times daily.    Historical Provider, MD  cetirizine (ZYRTEC) 10 MG tablet Take 10 mg by mouth daily. 08/18/15   Historical Provider, MD  fluticasone (FLONASE) 50 MCG/ACT nasal spray USE 2 SPRAYS DAILY AS DIRECTED 08/18/15   Historical Provider, MD  medroxyPROGESTERone (DEPO-PROVERA) 150 MG/ML injection INJECT AS DIRECTED INTRAMUSCULARLY EVERY 3 MONTHS 07/30/15   Historical Provider, MD  pantoprazole (PROTONIX) 40 MG tablet Take 40 mg by mouth every morning. 07/09/15   Historical Provider, MD  SUBOXONE 8-2 MG FILM DISSOLVE 1 FILM UNDER TONGUE EVERY 12 HOURS 04/21/16   Historical Provider, MD    Family History Family History  Problem Relation Age of Onset  . COPD Mother   . COPD Father     Social History Social History  Substance Use Topics  . Smoking status: Former Smoker    Packs/day: 0.50    Years: 18.00  Types: Cigarettes    Quit date: 07/21/2014  . Smokeless tobacco: Never Used  . Alcohol use No     Allergies   Benadryl [diphenhydramine hcl]; Cyclobenzaprine; Motrin [ibuprofen]; Ketorolac tromethamine; and Tramadol   Review of Systems Review of Systems  Constitutional: Positive for fatigue and fever.  HENT: Positive for congestion.   Respiratory: Positive for cough and wheezing.      Physical Exam Triage Vital Signs ED Triage Vitals  Enc Vitals Group     BP 09/27/16 1425 114/74     Pulse Rate 09/27/16 1425 98     Resp 09/27/16 1425 16     Temp 09/27/16 1425 98.9 F (37.2 C)     Temp Source 09/27/16 1425 Oral     SpO2 09/27/16 1425 95 %     Weight 09/27/16 1426 120 lb (54.4 kg)     Height  09/27/16 1426 4\' 6"  (1.372 m)     Head Circumference --      Peak Flow --      Pain Score 09/27/16 1427 7     Pain Loc --      Pain Edu? --      Excl. in [place]? --    No data found.   Updated Vital Signs BP 114/74 (BP Location: Right Arm)   Pulse 98   Temp 98.9 F (37.2 C) (Oral)   Resp 18   Ht 4\' 6"  (1.372 m)   Wt 120 lb (54.4 kg)   LMP 09/27/2016   SpO2 94%   BMI 28.93 kg/m   Visual Acuity Right Eye Distance:   Left Eye Distance:   Bilateral Distance:    Right Eye Near:   Left Eye Near:    Bilateral Near:     Physical Exam  Constitutional: She appears well-developed and well-nourished. No distress.  HENT:  Head: Normocephalic and atraumatic.  Right Ear: Tympanic membrane, external ear and ear canal normal.  Left Ear: Tympanic membrane, external ear and ear canal normal.  Nose: No mucosal edema, rhinorrhea, nose lacerations, sinus tenderness, nasal deformity, septal deviation or nasal septal hematoma. No epistaxis.  No foreign bodies. Right sinus exhibits no maxillary sinus tenderness and no frontal sinus tenderness. Left sinus exhibits no maxillary sinus tenderness and no frontal sinus tenderness.  Mouth/Throat: Uvula is midline, oropharynx is clear and moist and mucous membranes are normal. No oropharyngeal exudate.  Eyes: Conjunctivae and EOM are normal. Pupils are equal, round, and reactive to light. Right eye exhibits no discharge. Left eye exhibits no discharge. No scleral icterus.  Neck: Normal range of motion. Neck supple. No thyromegaly present.  Cardiovascular: Normal rate, regular rhythm and normal heart sounds.   Pulmonary/Chest: Effort normal. No respiratory distress. She has wheezes (diffuse inspiratory and expiratory wheezes bilaterally). She has no rales.  Lymphadenopathy:    She has no cervical adenopathy.  Skin: She is not diaphoretic.  Nursing note and vitals reviewed.    UC Treatments / Results  Labs (all labs ordered are listed, but only  abnormal results are displayed) Labs Reviewed - No data to display  EKG  EKG Interpretation None       Radiology Dg Chest 2 View  Result Date: 09/27/2016 CLINICAL DATA:  Cough and wheezing for 1 week EXAM: CHEST  2 VIEW COMPARISON:  07/04/2016 FINDINGS: Cardiac shadow is within normal limits. The lungs are well aerated bilaterally. Minimal bibasilar atelectasis is seen. No focal confluent infiltrate is noted. No bony abnormality is seen. IMPRESSION: Minimal bibasilar atelectasis.  Electronically Signed   By: Inez Catalina M.D.   On: 09/27/2016 15:27    Procedures Procedures (including critical care time)  Medications Ordered in UC Medications  ipratropium-albuterol (DUONEB) 0.5-2.5 (3) MG/3ML nebulizer solution 3 mL (3 mLs Nebulization Given 09/27/16 1431)  methylPREDNISolone sodium succinate (SOLU-MEDROL) 125 mg/2 mL injection 125 mg (125 mg Intramuscular Given 09/27/16 1456)  ipratropium-albuterol (DUONEB) 0.5-2.5 (3) MG/3ML nebulizer solution 3 mL (3 mLs Nebulization Given 09/27/16 1456)  ipratropium-albuterol (DUONEB) 0.5-2.5 (3) MG/3ML nebulizer solution 3 mL (3 mLs Nebulization Given 09/27/16 1549)     Initial Impression / Assessment and Plan / UC Course  I have reviewed the triage vital signs and the nursing notes.  Pertinent labs & imaging results that were available during my care of the patient were reviewed by me and considered in my medical decision making (see chart for details).       Final Clinical Impressions(s) / UC Diagnoses   Final diagnoses:  Asthma in adult, moderate persistent, with acute exacerbation    New Prescriptions Discharge Medication List as of 09/27/2016  4:22 PM     1. Chest x-ray results and diagnosis reviewed with patient 2. Patient given duoneb treatments x 3 with only minimal improvement of symptoms; O2 sat 92% after 3 nebulizer treatments; patient given solumedrol 125mg  IM x1. Recommend patient go to ED for further evaluation and  management due to only minimal improvement after 3 nebulizer treatments. Patient verbalizes understanding and husband will drive patient to ED. Report called to triage RN at [facility] ED.    Norval Gable, MD 09/27/16 1725

## 2016-09-27 NOTE — ED Triage Notes (Signed)
Patient c/o cough, chest congestion, bodyaches, Has, and fever.  Patient diagnosed with Pneumonia in Novemeber 2017.  Patient reports history of asthma.

## 2016-09-27 NOTE — Discharge Instructions (Signed)
Recommend patient go to Emergency Department for further evaluation and management °

## 2016-10-27 ENCOUNTER — Ambulatory Visit: Payer: Medicaid Other | Admitting: Pulmonary Disease

## 2017-01-29 ENCOUNTER — Encounter: Payer: Self-pay | Admitting: *Deleted

## 2017-02-03 ENCOUNTER — Other Ambulatory Visit: Payer: Self-pay | Admitting: Internal Medicine

## 2017-02-03 DIAGNOSIS — R748 Abnormal levels of other serum enzymes: Secondary | ICD-10-CM

## 2017-02-06 ENCOUNTER — Ambulatory Visit (INDEPENDENT_AMBULATORY_CARE_PROVIDER_SITE_OTHER): Payer: Medicaid Other | Admitting: Pulmonary Disease

## 2017-02-06 ENCOUNTER — Encounter: Payer: Self-pay | Admitting: Pulmonary Disease

## 2017-02-06 VITALS — BP 108/66 | HR 87 | Resp 16 | Ht <= 58 in | Wt 117.0 lb

## 2017-02-06 DIAGNOSIS — R058 Other specified cough: Secondary | ICD-10-CM

## 2017-02-06 DIAGNOSIS — Z8701 Personal history of pneumonia (recurrent): Secondary | ICD-10-CM | POA: Diagnosis not present

## 2017-02-06 DIAGNOSIS — Z87891 Personal history of nicotine dependence: Secondary | ICD-10-CM

## 2017-02-06 DIAGNOSIS — J449 Chronic obstructive pulmonary disease, unspecified: Secondary | ICD-10-CM | POA: Diagnosis not present

## 2017-02-06 DIAGNOSIS — R05 Cough: Secondary | ICD-10-CM

## 2017-02-06 NOTE — Patient Instructions (Signed)
Continue Symbicort inhaler twice a day Continue albuterol (Ventolin) as needed Take the prednisone as prescribed by Dr. Marigene Ehlers Change Protonix to nighttime. Take 1-2 hours prior to sleep Follow-up in 3-4 months or sooner as needed

## 2017-02-08 NOTE — Progress Notes (Signed)
PULMONARY OFFICE FOLLOW UP  PT PROFILE: 36 y.o. F former smoker with asthma hospitalized 11/28-12/04/17 with acute hypoxic respiratory failure, bilateral pulmonary infiltrates thought to represent CAP and acute bronchospasm. Seen in consultation by Pulmonary Medicine during that visit. Improved with antibiotics and steroids. Also has history of similar illness in May 2017 but pulmonary infiltrates were minimal and only seen on CT chest  PROBLEMS: Mild persistent asthma CAP 05/2016  INTERVAL HISTORY: No major events since discharge  SUBJ: Last seen 06/2016. She missed a follow-up appointment in the interim. Since that time she has done reasonably well. However, recently, she has developed increasing shortness of breath and is using her albuterol inhaler more frequently. It was recommended by Dr. Marigene Ehlers that she follow-up with me. Her cough is mostly at night. It is productive of "mucus". She denies fever, hemoptysis, pleuritic chest pain, lower extremity edema, calf tenderness. She has recently been prescribed prednisone for hip pain. She has not yet filled this medication. OBJ: Vitals:   02/06/17 1139  BP: 108/66  Pulse: 87  Resp: 16  SpO2: 95%  Weight: 117 lb (53.1 kg)  Height: 4\' 8"  (1.422 m)    Gen: NAD HEENT: WNL Neck: NO LAN, no JVD  Lungs: full BS, Slightly coarse, no wheezes Cardiovascular: Reg, no M noted Abdomen: Soft, NT +BS Ext: no C/C/E Neuro: grossly intact   DATA: CXR (09/27/16): NACPD  IMPRESSION: Chronic asthmatic bronchitis (HCC)  Former smoker  History of pneumonia  Nocturnal cough   PLAN: Continue Symbicort inhaler twice a day Continue albuterol (Ventolin) as needed Take the prednisone as prescribed by Dr. Marigene Ehlers Change Protonix to nighttime. Take 1-2 hours prior to sleep Follow-up in 3-4 months or sooner as needed  Merton Border, MD PCCM service Mobile [phone] Pager [phone] 02/08/2017 2:56 PM

## 2017-02-10 ENCOUNTER — Other Ambulatory Visit
Admission: RE | Admit: 2017-02-10 | Discharge: 2017-02-10 | Disposition: A | Discharge status: Home / Self Care | Payer: Medicaid Other | Source: Ambulatory Visit | Attending: Internal Medicine | Admitting: Internal Medicine

## 2017-02-10 ENCOUNTER — Ambulatory Visit
Admission: RE | Admit: 2017-02-10 | Discharge: 2017-02-10 | Disposition: A | Discharge status: Home / Self Care | Payer: Medicaid Other | Source: Ambulatory Visit | Attending: Internal Medicine | Admitting: Internal Medicine

## 2017-02-10 ENCOUNTER — Encounter
Admission: RE | Admit: 2017-02-10 | Discharge: 2017-02-10 | Disposition: A | Discharge status: Home / Self Care | Payer: Medicaid Other | Source: Ambulatory Visit | Attending: Internal Medicine | Admitting: Internal Medicine

## 2017-02-10 DIAGNOSIS — R748 Abnormal levels of other serum enzymes: Secondary | ICD-10-CM | POA: Diagnosis not present

## 2017-02-10 LAB — PREGNANCY, URINE: Preg Test, Ur: NEGATIVE

## 2017-02-10 MED ORDER — TECHNETIUM TC 99M MEDRONATE IV KIT
22.1900 | PACK | Freq: Once | INTRAVENOUS | Status: AC | PRN
  Administered 2017-02-10: 22.19 via INTRAVENOUS

## 2017-05-27 ENCOUNTER — Ambulatory Visit: Payer: Self-pay | Admitting: Obstetrics and Gynecology

## 2017-06-09 NOTE — Progress Notes (Deleted)
Annual for pap vs infertility (pap is up to date infertility likley not covered by insurance) 05/14/16 NIL, 03/28/15 NIL HPV negative Prio C-section Prior ovarian cystectomy Discontinued depo provera   [ ]  [facility] [ ]  Irregular menses then PCOS panel and [place]

## 2017-06-10 ENCOUNTER — Ambulatory Visit: Payer: Self-pay | Admitting: Obstetrics and Gynecology

## 2017-06-29 ENCOUNTER — Ambulatory Visit: Payer: Self-pay | Admitting: Obstetrics and Gynecology

## 2017-07-10 ENCOUNTER — Ambulatory Visit: Payer: Self-pay | Admitting: Obstetrics and Gynecology

## 2017-07-28 ENCOUNTER — Telehealth: Payer: Self-pay | Admitting: Obstetrics and Gynecology

## 2017-07-28 NOTE — Telephone Encounter (Signed)
[facility] referring for Papsmear and discuss fertility. Called and lvm for patient to call back to be scheule

## 2017-08-03 NOTE — Telephone Encounter (Signed)
Called and lvm for patient to call back to be scheule

## 2017-09-15 ENCOUNTER — Ambulatory Visit: Payer: Self-pay | Admitting: Pulmonary Disease

## 2017-10-06 ENCOUNTER — Other Ambulatory Visit: Payer: Self-pay | Admitting: Anesthesiology

## 2017-10-06 DIAGNOSIS — M545 Low back pain: Secondary | ICD-10-CM

## 2017-10-22 ENCOUNTER — Telehealth: Payer: Self-pay | Admitting: Pulmonary Disease

## 2017-10-22 ENCOUNTER — Encounter: Payer: Self-pay | Admitting: Pulmonary Disease

## 2017-10-22 NOTE — Telephone Encounter (Signed)
3 attempts to schedule fu appt from recall list.  Mailed letter. Deleting recall.

## 2018-01-05 ENCOUNTER — Other Ambulatory Visit: Payer: Self-pay | Admitting: Nurse Practitioner

## 2018-01-05 DIAGNOSIS — M25552 Pain in left hip: Secondary | ICD-10-CM

## 2018-01-05 DIAGNOSIS — M25551 Pain in right hip: Secondary | ICD-10-CM

## 2018-02-15 ENCOUNTER — Other Ambulatory Visit: Payer: Self-pay | Admitting: Nurse Practitioner

## 2018-02-15 DIAGNOSIS — M25552 Pain in left hip: Secondary | ICD-10-CM

## 2018-02-15 DIAGNOSIS — M25551 Pain in right hip: Secondary | ICD-10-CM

## 2018-03-05 ENCOUNTER — Inpatient Hospital Stay: Admit: 2018-03-05 | Payer: Self-pay

## 2018-03-05 ENCOUNTER — Ambulatory Visit
Admission: RE | Admit: 2018-03-05 | Discharge: 2018-03-05 | Disposition: A | Discharge status: Home / Self Care | Payer: 59 | Source: Ambulatory Visit | Attending: Anesthesiology | Admitting: Anesthesiology

## 2018-03-05 ENCOUNTER — Encounter: Payer: Self-pay | Admitting: Diagnostic Radiology

## 2018-03-05 ENCOUNTER — Ambulatory Visit
Admission: RE | Admit: 2018-03-05 | Discharge: 2018-03-05 | Disposition: A | Discharge status: Home / Self Care | Payer: 59 | Source: Ambulatory Visit | Attending: Nurse Practitioner | Admitting: Nurse Practitioner

## 2018-03-05 DIAGNOSIS — M8938 Hypertrophy of bone, other site: Secondary | ICD-10-CM | POA: Diagnosis not present

## 2018-03-05 DIAGNOSIS — M25552 Pain in left hip: Secondary | ICD-10-CM

## 2018-03-05 DIAGNOSIS — M25559 Pain in unspecified hip: Secondary | ICD-10-CM | POA: Insufficient documentation

## 2018-03-05 DIAGNOSIS — M545 Low back pain: Secondary | ICD-10-CM | POA: Diagnosis not present

## 2018-03-05 DIAGNOSIS — M2570 Osteophyte, unspecified joint: Secondary | ICD-10-CM | POA: Diagnosis not present

## 2018-03-05 DIAGNOSIS — M25551 Pain in right hip: Secondary | ICD-10-CM

## 2019-12-16 DIAGNOSIS — J45909 Unspecified asthma, uncomplicated: Secondary | ICD-10-CM | POA: Insufficient documentation

## 2020-05-13 DIAGNOSIS — K5090 Crohn's disease, unspecified, without complications: Secondary | ICD-10-CM | POA: Insufficient documentation

## 2020-08-09 DIAGNOSIS — F1120 Opioid dependence, uncomplicated: Secondary | ICD-10-CM | POA: Insufficient documentation

## 2020-08-17 DIAGNOSIS — R945 Abnormal results of liver function studies: Secondary | ICD-10-CM | POA: Insufficient documentation

## 2020-10-25 DIAGNOSIS — F411 Generalized anxiety disorder: Secondary | ICD-10-CM | POA: Insufficient documentation

## 2020-12-18 DIAGNOSIS — M25551 Pain in right hip: Secondary | ICD-10-CM | POA: Insufficient documentation

## 2021-05-16 DIAGNOSIS — Z0000 Encounter for general adult medical examination without abnormal findings: Secondary | ICD-10-CM | POA: Insufficient documentation

## 2022-08-29 ENCOUNTER — Other Ambulatory Visit: Payer: Self-pay | Admitting: Family

## 2022-08-29 DIAGNOSIS — F322 Major depressive disorder, single episode, severe without psychotic features: Secondary | ICD-10-CM

## 2022-09-05 ENCOUNTER — Ambulatory Visit: Payer: BC Managed Care – PPO | Admitting: Family

## 2022-09-05 ENCOUNTER — Other Ambulatory Visit: Payer: Self-pay

## 2022-09-05 MED ORDER — ONDANSETRON 4 MG PO TBDP: 4.0000 mg | ORAL_TABLET | Freq: Three times a day (TID) | ORAL | 1 refills | Status: DC | PRN

## 2022-09-18 ENCOUNTER — Other Ambulatory Visit: Payer: Self-pay

## 2022-09-18 MED ORDER — OXYCODONE HCL 10 MG PO TABS: 10.0000 mg | ORAL_TABLET | Freq: Two times a day (BID) | ORAL | 0 refills | Status: DC | PRN

## 2022-09-19 ENCOUNTER — Ambulatory Visit: Payer: BC Managed Care – PPO | Admitting: Family

## 2022-09-26 ENCOUNTER — Ambulatory Visit: Payer: BC Managed Care – PPO | Admitting: Family

## 2022-10-04 ENCOUNTER — Other Ambulatory Visit: Payer: Self-pay | Admitting: Family

## 2022-10-14 ENCOUNTER — Ambulatory Visit (INDEPENDENT_AMBULATORY_CARE_PROVIDER_SITE_OTHER): Payer: BC Managed Care – PPO | Admitting: Family

## 2022-10-14 ENCOUNTER — Encounter: Payer: Self-pay | Admitting: Family

## 2022-10-14 VITALS — BP 120/64 | HR 88 | Ht <= 58 in | Wt 158.0 lb

## 2022-10-14 DIAGNOSIS — I10 Essential (primary) hypertension: Secondary | ICD-10-CM

## 2022-10-14 DIAGNOSIS — G894 Chronic pain syndrome: Secondary | ICD-10-CM

## 2022-10-14 DIAGNOSIS — R7303 Prediabetes: Secondary | ICD-10-CM

## 2022-10-14 DIAGNOSIS — E782 Mixed hyperlipidemia: Secondary | ICD-10-CM | POA: Diagnosis not present

## 2022-10-14 DIAGNOSIS — E559 Vitamin D deficiency, unspecified: Secondary | ICD-10-CM

## 2022-10-14 DIAGNOSIS — F411 Generalized anxiety disorder: Secondary | ICD-10-CM

## 2022-10-14 DIAGNOSIS — E538 Deficiency of other specified B group vitamins: Secondary | ICD-10-CM | POA: Diagnosis not present

## 2022-10-14 DIAGNOSIS — R5383 Other fatigue: Secondary | ICD-10-CM

## 2022-10-14 DIAGNOSIS — F1190 Opioid use, unspecified, uncomplicated: Secondary | ICD-10-CM

## 2022-10-14 DIAGNOSIS — E039 Hypothyroidism, unspecified: Secondary | ICD-10-CM | POA: Diagnosis not present

## 2022-10-14 MED ORDER — ALPRAZOLAM 0.5 MG PO TABS: 0.5000 mg | ORAL_TABLET | Freq: Three times a day (TID) | ORAL | 1 refills | Status: DC | PRN

## 2022-10-14 NOTE — Assessment & Plan Note (Signed)
Changing her meds to alprazolam instead of diazepam.

## 2022-10-14 NOTE — Progress Notes (Signed)
Established Patient Office Visit  Subjective:  Patient ID: Karen [name], female    DOB: 07-09-1981  Age: 42 y.o. MRN: [id number]  Chief Complaint  Patient presents with   Follow-up    Refills    Patient is here today for her 2 months follow up.  She has been feeling well physically, but has been having an increase in anxiety since last appointment.   She does not have additional concerns to discuss today.  Labs are due today. She needs refills.   I have reviewed her active problem list, medication list, allergies, notes from last encounter, lab results, and PDMP for her appointment today.     No other concerns at this time.   Past Medical History:  Diagnosis Date   Allergy    Anxiety    Asthma    GERD (gastroesophageal reflux disease)    Internal hemorrhoids     Past Surgical History:  Procedure Laterality Date   CARPAL TUNNEL RELEASE Bilateral 2010   CESAREAN SECTION  2009   COLONOSCOPY     EPIGASTRIC HERNIA REPAIR N/A 09/03/2015   Procedure: HERNIA REPAIR EPIGASTRIC ADULT;  Surgeon: Robert Bellow, MD;  Location: [facility] ORS;  Service: General;  Laterality: N/A;   HIP SURGERY  2013   OVARIAN CYST SURGERY  2002   [facility] [place]  2010    Social History   Socioeconomic History   Marital status: Married    Spouse name: Not on file   Number of children: Not on file   Years of education: Not on file   Highest education level: Not on file  Occupational History   Not on file  Tobacco Use   Smoking status: Former    Packs/day: 0.50    Years: 18.00    Additional pack years: 0.00    Total pack years: 9.00    Types: Cigarettes    Quit date: 07/21/2014    Years since quitting: 8.2   Smokeless tobacco: Never  Substance and Sexual Activity   Alcohol use: No    Alcohol/week: 0.0 standard drinks of alcohol   Drug use: No   Sexual activity: Yes  Other Topics Concern   Not on file  Social History Narrative   Not on file   Social  Determinants of Health   Financial Resource Strain: Not on file  Food Insecurity: Not on file  Transportation Needs: Not on file  Physical Activity: Not on file  Stress: Not on file  Social Connections: Not on file  Intimate Partner Violence: Not on file    Family History  Problem Relation Age of Onset   COPD Mother    COPD Father     Allergies  Allergen Reactions   Benadryl [Diphenhydramine Hcl] Other (See Comments)    Leg jumpy   Cyclobenzaprine Other (See Comments)    Other reaction(s): "legs jumpy"  Restless legs   Ketorolac Hives   Motrin [Ibuprofen] Hives   Ketorolac Tromethamine Rash   Tramadol Rash and Hives    Review of Systems  Constitutional:  Positive for malaise/fatigue.  Musculoskeletal:  Positive for back pain and joint pain.  Psychiatric/Behavioral:  Positive for depression. The patient is nervous/anxious and has insomnia.   All other systems reviewed and are negative.      Objective:   BP 120/64   Pulse 88   Ht 4\' 8"  (1.422 m)   Wt 158 lb (71.7 kg)   LMP 09/27/2016   SpO2 97%  BMI 35.42 kg/m   Vitals:   10/14/22 0853  BP: 120/64  Pulse: 88  Height: 4\' 8"  (1.422 m)  Weight: 158 lb (71.7 kg)  SpO2: 97%  BMI (Calculated): 35.44    Physical Exam Musculoskeletal:     Lumbar back: Tenderness present. Positive right straight leg raise test and positive left straight leg raise test.     Right hip: Tenderness present. Decreased range of motion. Decreased strength.     Left hip: Tenderness present. Decreased range of motion. Decreased strength.  Psychiatric:        Attention and Perception: Perception normal.        Mood and Affect: Mood is anxious and depressed. Affect is tearful.        Speech: Speech normal.        Behavior: Behavior normal. Behavior is cooperative.        Thought Content: Thought content normal.        Cognition and Memory: Cognition and memory normal.        Judgment: Judgment normal.      No results found for  any visits on 10/14/22.  No results found for this or any previous visit (from the past 2160 hour(s)).    Assessment & Plan:   Problem List Items Addressed This Visit     Chronic, continuous use of opioids    Sending UDS.  Patient brought UDS from her recent appointment at [facility] as well.   She has narcan available at home, and her family are aware of how to use.       Relevant Orders   CBC With Differential   CMP14+EGFR   ToxASSURE Select 16, UR   Chronic pain   Relevant Orders   CBC With Differential   CMP14+EGFR   Anxiety state    Changing her meds to alprazolam instead of diazepam.        Relevant Medications   ALPRAZolam (XANAX) 0.5 MG tablet   Other Visit Diagnoses     Essential hypertension, benign    -  Primary   Relevant Orders   CBC With Differential   CMP14+EGFR   Mixed hyperlipidemia       Relevant Orders   Lipid panel   CBC With Differential   CMP14+EGFR   B12 deficiency due to diet       Relevant Orders   CBC With Differential   CMP14+EGFR   Vitamin B12   Hypothyroidism (acquired)       Relevant Orders   CBC With Differential   CMP14+EGFR   TSH   Prediabetes       Relevant Orders   CBC With Differential   CMP14+EGFR   Hemoglobin A1c   Vitamin D deficiency, unspecified       Relevant Orders   VITAMIN D 25 Hydroxy (Vit-D Deficiency, Fractures)   CBC With Differential   CMP14+EGFR   Other fatigue       Relevant Orders   CBC With Differential   CMP14+EGFR       Return in about 2 months (around 12/14/2022).   Total time spent: 45 minutes  Mechele Claude, FNP  10/14/2022

## 2022-10-14 NOTE — Assessment & Plan Note (Addendum)
Sending UDS.  Patient brought UDS from her recent appointment at [facility] as well.   She has narcan available at home, and her family are aware of how to use.

## 2022-10-15 LAB — CMP14+EGFR
ALT: 14 IU/L (ref 0–32)
AST: 16 IU/L (ref 0–40)
Albumin/Globulin Ratio: 1.6 (ref 1.2–2.2)
Albumin: 4.1 g/dL (ref 3.9–4.9)
Alkaline Phosphatase: 81 IU/L (ref 44–121)
BUN/Creatinine Ratio: 10 (ref 9–23)
BUN: 9 mg/dL (ref 6–24)
Bilirubin Total: 0.3 mg/dL (ref 0.0–1.2)
CO2: 24 mmol/L (ref 20–29)
Calcium: 8.9 mg/dL (ref 8.7–10.2)
Chloride: 105 mmol/L (ref 96–106)
Creatinine, Ser: 0.92 mg/dL (ref 0.57–1.00)
Globulin, Total: 2.5 g/dL (ref 1.5–4.5)
Glucose: 90 mg/dL (ref 70–99)
Potassium: 4.5 mmol/L (ref 3.5–5.2)
Sodium: 141 mmol/L (ref 134–144)
Total Protein: 6.6 g/dL (ref 6.0–8.5)
eGFR: 80 mL/min/{1.73_m2} (ref >59)

## 2022-10-15 LAB — CBC WITH DIFFERENTIAL
Basophils Absolute: 0 10*3/uL (ref 0.0–0.2)
Basos: 1 %
EOS (ABSOLUTE): 0.4 10*3/uL (ref 0.0–0.4)
Eos: 5 %
Hematocrit: 35 % (ref 34.0–46.6)
Hemoglobin: 10.8 g/dL — ABNORMAL LOW (ref 11.1–15.9)
Immature Grans (Abs): 0 10*3/uL (ref 0.0–0.1)
Immature Granulocytes: 0 %
Lymphocytes Absolute: 1.3 10*3/uL (ref 0.7–3.1)
Lymphs: 20 %
MCH: 22.7 pg — ABNORMAL LOW (ref 26.6–33.0)
MCHC: 30.9 g/dL — ABNORMAL LOW (ref 31.5–35.7)
MCV: 74 fL — ABNORMAL LOW (ref 79–97)
Monocytes Absolute: 0.5 10*3/uL (ref 0.1–0.9)
Monocytes: 7 %
Neutrophils Absolute: 4.6 10*3/uL (ref 1.4–7.0)
Neutrophils: 67 %
RBC: 4.76 x10E6/uL (ref 3.77–5.28)
RDW: 16 % — ABNORMAL HIGH (ref 11.7–15.4)
WBC: 6.8 10*3/uL (ref 3.4–10.8)

## 2022-10-15 LAB — LIPID PANEL
Chol/HDL Ratio: 2.7 ratio (ref 0.0–4.4)
Cholesterol, Total: 130 mg/dL (ref 100–199)
HDL: 49 mg/dL (ref >39)
LDL Chol Calc (NIH): 68 mg/dL (ref 0–99)
Triglycerides: 59 mg/dL (ref 0–149)
VLDL Cholesterol Cal: 13 mg/dL (ref 5–40)

## 2022-10-15 LAB — TSH: TSH: 0.071 u[IU]/mL — ABNORMAL LOW (ref 0.450–4.500)

## 2022-10-15 LAB — VITAMIN B12: Vitamin B-12: 395 pg/mL (ref 232–1245)

## 2022-10-15 LAB — HEMOGLOBIN A1C
Est. average glucose Bld gHb Est-mCnc: 128 mg/dL
Hgb A1c MFr Bld: 6.1 % — ABNORMAL HIGH (ref 4.8–5.6)

## 2022-10-15 LAB — VITAMIN D 25 HYDROXY (VIT D DEFICIENCY, FRACTURES): Vit D, 25-Hydroxy: 39.7 ng/mL (ref 30.0–100.0)

## 2022-10-16 ENCOUNTER — Other Ambulatory Visit: Payer: Self-pay

## 2022-10-18 ENCOUNTER — Other Ambulatory Visit: Payer: Self-pay | Admitting: Family

## 2022-10-18 LAB — TOXASSURE SELECT 16, UR

## 2022-10-18 MED ORDER — OXYCODONE HCL 10 MG PO TABS: 10.0000 mg | ORAL_TABLET | Freq: Two times a day (BID) | ORAL | 0 refills | Status: DC | PRN

## 2022-10-22 ENCOUNTER — Encounter: Payer: Self-pay | Admitting: Family

## 2022-10-24 ENCOUNTER — Telehealth: Payer: 59 | Admitting: Family Medicine

## 2022-10-24 DIAGNOSIS — J029 Acute pharyngitis, unspecified: Secondary | ICD-10-CM | POA: Diagnosis not present

## 2022-10-24 NOTE — Progress Notes (Signed)

## 2022-11-10 ENCOUNTER — Other Ambulatory Visit: Payer: Self-pay | Admitting: Family

## 2022-11-13 ENCOUNTER — Other Ambulatory Visit: Payer: Self-pay

## 2022-11-14 MED ORDER — OXYCODONE HCL 10 MG PO TABS: 10.0000 mg | ORAL_TABLET | Freq: Two times a day (BID) | ORAL | 0 refills | Status: DC | PRN

## 2022-11-14 NOTE — Progress Notes (Signed)
I have provided 5 minutes of non face to face time during this encounter for chart review and documentation.   

## 2022-12-03 ENCOUNTER — Telehealth: Payer: Self-pay | Admitting: Family

## 2022-12-03 NOTE — Telephone Encounter (Signed)
Patient left VM [facility]

## 2022-12-05 ENCOUNTER — Other Ambulatory Visit: Payer: Self-pay

## 2022-12-07 ENCOUNTER — Other Ambulatory Visit: Payer: Self-pay | Admitting: Family

## 2022-12-07 DIAGNOSIS — F322 Major depressive disorder, single episode, severe without psychotic features: Secondary | ICD-10-CM

## 2022-12-08 MED ORDER — ALPRAZOLAM 0.5 MG PO TABS: 0.5000 mg | ORAL_TABLET | Freq: Three times a day (TID) | ORAL | 1 refills | Status: DC | PRN

## 2022-12-10 ENCOUNTER — Telehealth: Payer: Self-pay | Admitting: Family

## 2022-12-10 NOTE — Telephone Encounter (Signed)
Patient left VM that she needs her pain meds sent in - will be out Saturday. Please send in.

## 2022-12-11 ENCOUNTER — Other Ambulatory Visit: Payer: Self-pay | Admitting: Family

## 2022-12-11 MED ORDER — OXYCODONE HCL 10 MG PO TABS: 10.0000 mg | ORAL_TABLET | Freq: Two times a day (BID) | ORAL | 0 refills | Status: DC | PRN

## 2022-12-11 NOTE — Telephone Encounter (Signed)
Patient called again and left another VM that she needs her oxy sent in - will be out on Saturday.

## 2022-12-12 ENCOUNTER — Telehealth: Payer: Self-pay

## 2022-12-12 NOTE — Telephone Encounter (Signed)
Patient has called again asking for her pain medication states she will be out Saturday

## 2022-12-15 ENCOUNTER — Ambulatory Visit (INDEPENDENT_AMBULATORY_CARE_PROVIDER_SITE_OTHER): Payer: BC Managed Care – PPO | Admitting: Family

## 2022-12-15 VITALS — BP 118/60 | HR 98 | Ht <= 58 in | Wt 152.8 lb

## 2022-12-15 DIAGNOSIS — F4310 Post-traumatic stress disorder, unspecified: Secondary | ICD-10-CM

## 2022-12-15 DIAGNOSIS — G894 Chronic pain syndrome: Secondary | ICD-10-CM

## 2022-12-15 DIAGNOSIS — F1190 Opioid use, unspecified, uncomplicated: Secondary | ICD-10-CM | POA: Diagnosis not present

## 2022-12-15 DIAGNOSIS — M25551 Pain in right hip: Secondary | ICD-10-CM

## 2022-12-15 DIAGNOSIS — M25552 Pain in left hip: Secondary | ICD-10-CM

## 2022-12-15 DIAGNOSIS — F411 Generalized anxiety disorder: Secondary | ICD-10-CM | POA: Diagnosis not present

## 2022-12-15 NOTE — Telephone Encounter (Signed)
Discussed at follow up

## 2022-12-15 NOTE — Progress Notes (Signed)
Established Patient Office Visit  Subjective:  Patient ID: Karen [name], female    DOB: Nov 16, 1980  Age: 42 y.o. MRN: [id number]  Chief Complaint  Patient presents with   Follow-up    2 month follow up    Pt. Here for her 2 month f/u.  She has been feeling well in general since her last appointment, says that her meds are currently doing well.   She does need referral to pain clinic, as well as for [place] to check on the ortho referral for her hip.   No other concerns at this time.   Past Medical History:  Diagnosis Date   Allergy    Anxiety    Asthma    GERD (gastroesophageal reflux disease)    Internal hemorrhoids     Past Surgical History:  Procedure Laterality Date   CARPAL TUNNEL RELEASE Bilateral 2010   CESAREAN SECTION  2009   COLONOSCOPY     EPIGASTRIC HERNIA REPAIR N/A 09/03/2015   Procedure: HERNIA REPAIR EPIGASTRIC ADULT;  Surgeon: Earline Mayotte, MD;  Location: [facility] ORS;  Service: General;  Laterality: N/A;   HIP SURGERY  2013   OVARIAN CYST SURGERY  2002   [facility] [place]   TONSILLECTOMY  2010    Social History   Socioeconomic History   Marital status: Married    Spouse name: Not on file   Number of children: Not on file   Years of education: Not on file   Highest education level: Not on file  Occupational History   Not on file  Tobacco Use   Smoking status: Former    Packs/day: 0.50    Years: 18.00    Additional pack years: 0.00    Total pack years: 9.00    Types: Cigarettes    Quit date: 07/21/2014    Years since quitting: 8.4   Smokeless tobacco: Never  Substance and Sexual Activity   Alcohol use: No    Alcohol/week: 0.0 standard drinks of alcohol   Drug use: No   Sexual activity: Yes  Other Topics Concern   Not on file  Social History Narrative   Not on file   Social Determinants of Health   Financial Resource Strain: Not on file  Food Insecurity: Not on file  Transportation Needs: Not on file  Physical Activity: Not  on file  Stress: Not on file  Social Connections: Not on file  Intimate Partner Violence: Not on file    Family History  Problem Relation Age of Onset   COPD Mother    COPD Father     Allergies  Allergen Reactions   Benadryl [Diphenhydramine Hcl] Other (See Comments)    Leg jumpy   Cyclobenzaprine Other (See Comments)    Other reaction(s): "legs jumpy"  Restless legs   Ketorolac Hives   Motrin [Ibuprofen] Hives   Ketorolac Tromethamine Rash   Tramadol Rash and Hives    Review of Systems  Musculoskeletal:  Positive for back pain and joint pain.  All other systems reviewed and are negative.      Objective:   BP 118/60   Pulse 98   Ht 4\' 8"  (1.422 m)   Wt 152 lb 12.8 oz (69.3 kg)   LMP 09/27/2016   SpO2 96%   BMI 34.26 kg/m   Vitals:   12/15/22 0929  BP: 118/60  Pulse: 98  Height: 4\' 8"  (1.422 m)  Weight: 152 lb 12.8 oz (69.3 kg)  SpO2: 96%  BMI (Calculated):  34.28    Physical Exam Vitals and nursing note reviewed.  Constitutional:      Appearance: Normal appearance. She is normal weight.  HENT:     Head: Normocephalic.  Eyes:     Pupils: Pupils are equal, round, and reactive to light.  Cardiovascular:     Rate and Rhythm: Normal rate.  Pulmonary:     Effort: Pulmonary effort is normal.  Musculoskeletal:     Right hip: Tenderness present. Decreased range of motion. Decreased strength.     Left hip: Tenderness present. Decreased range of motion. Decreased strength.  Neurological:     General: No focal deficit present.     Mental Status: She is alert and oriented to person, place, and time. Mental status is at baseline.     Gait: Gait abnormal.  Psychiatric:        Mood and Affect: Mood normal.        Behavior: Behavior normal.        Thought Content: Thought content normal.        Judgment: Judgment normal.      No results found for any visits on 12/15/22.  Recent Results (from the past 2160 hour(s))  Lipid panel     Status: None    Collection Time: 10/14/22  1:32 PM  Result Value Ref Range   Cholesterol, Total 130 100 - 199 mg/dL   Triglycerides 59 0 - 149 mg/dL   HDL 49 >40 mg/dL   VLDL Cholesterol Cal 13 5 - 40 mg/dL   LDL Chol Calc (NIH) 68 0 - 99 mg/dL   Chol/HDL Ratio 2.7 0.0 - 4.4 ratio    Comment:                                   T. Chol/HDL Ratio                                             Men  Women                               1/2 Avg.Risk  3.4    3.3                                   Avg.Risk  5.0    4.4                                2X Avg.Risk  9.6    7.1                                3X Avg.Risk 23.4   11.0   VITAMIN D 25 Hydroxy (Vit-D Deficiency, Fractures)     Status: None   Collection Time: 10/14/22  1:32 PM  Result Value Ref Range   Vit D, 25-Hydroxy 39.7 30.0 - 100.0 ng/mL    Comment: Vitamin D deficiency has been defined by the Institute of Medicine and an Endocrine Society practice guideline as a level of serum 25-OH vitamin D less than 20 ng/mL (1,2). The Endocrine Society  went on to further define vitamin D insufficiency as a level between 21 and 29 ng/mL (2). 1. IOM (Institute of Medicine). 2010. Dietary reference    intakes for calcium and D. [place]: The    [employer]. 2. Holick MF, Binkley , Bischoff-Ferrari HA, et al.    Evaluation, treatment, and prevention of vitamin D    deficiency: an Endocrine Society clinical practice    guideline. JCEM. 2011 Jul; 96(7):1911-30.   CBC With Differential     Status: Abnormal   Collection Time: 10/14/22  1:32 PM  Result Value Ref Range   WBC 6.8 3.4 - 10.8 x10E3/uL   RBC 4.76 3.77 - 5.28 x10E6/uL   Hemoglobin 10.8 (L) 11.1 - 15.9 g/dL   Hematocrit 16.1 09.6 - 46.6 %   MCV 74 (L) 79 - 97 fL   MCH 22.7 (L) 26.6 - 33.0 pg   MCHC 30.9 (L) 31.5 - 35.7 g/dL   RDW 04.5 (H) 40.9 - 81.1 %   Neutrophils 67 Not Estab. %   Lymphs 20 Not Estab. %   Monocytes 7 Not Estab. %   Eos 5 Not Estab. %   Basos 1 Not Estab. %    Neutrophils Absolute 4.6 1.4 - 7.0 x10E3/uL   Lymphocytes Absolute 1.3 0.7 - 3.1 x10E3/uL   Monocytes Absolute 0.5 0.1 - 0.9 x10E3/uL   EOS (ABSOLUTE) 0.4 0.0 - 0.4 x10E3/uL   Basophils Absolute 0.0 0.0 - 0.2 x10E3/uL   Immature Granulocytes 0 Not Estab. %   Immature Grans (Abs) 0.0 0.0 - 0.1 x10E3/uL  CMP14+EGFR     Status: None   Collection Time: 10/14/22  1:32 PM  Result Value Ref Range   Glucose 90 70 - 99 mg/dL   BUN 9 6 - 24 mg/dL   Creatinine, Ser 9.14 0.57 - 1.00 mg/dL   eGFR 80 >78 GN/FAO/1.30   BUN/Creatinine Ratio 10 9 - 23   Sodium 141 134 - 144 mmol/L   Potassium 4.5 3.5 - 5.2 mmol/L   Chloride 105 96 - 106 mmol/L   CO2 24 20 - 29 mmol/L   Calcium 8.9 8.7 - 10.2 mg/dL   Total Protein 6.6 6.0 - 8.5 g/dL   Albumin 4.1 3.9 - 4.9 g/dL   Globulin, Total 2.5 1.5 - 4.5 g/dL   Albumin/Globulin Ratio 1.6 1.2 - 2.2   Bilirubin Total 0.3 0.0 - 1.2 mg/dL   Alkaline Phosphatase 81 44 - 121 IU/L   AST 16 0 - 40 IU/L   ALT 14 0 - 32 IU/L  TSH     Status: Abnormal   Collection Time: 10/14/22  1:32 PM  Result Value Ref Range   TSH 0.071 (L) 0.450 - 4.500 uIU/mL  Hemoglobin A1c     Status: Abnormal   Collection Time: 10/14/22  1:32 PM  Result Value Ref Range   Hgb A1c MFr Bld 6.1 (H) 4.8 - 5.6 %    Comment:          Prediabetes: 5.7 - 6.4          Diabetes: >6.4          Glycemic control for adults with diabetes: <7.0    Est. average glucose Bld gHb Est-mCnc 128 mg/dL  Vitamin Q65     Status: None   Collection Time: 10/14/22  1:32 PM  Result Value Ref Range   Vitamin B-12 395 232 - 1,245 pg/mL  ToxASSURE Select 16, UR     Status: None  Collection Time: 10/14/22  2:03 PM  Result Value Ref Range   Summary Note     Comment: ==================================================================== ToxASSURE Select 16, UR ==================================================================== Test                             Result       Flag       Units  Drug Present    Desmethyldiazepam              545                     ng/mg creat   Oxazepam                       558                     ng/mg creat   Temazepam                      753                     ng/mg creat    Desmethyldiazepam, oxazepam, and temazepam are benzodiazepine drugs,    but may also be present as common metabolites of other    benzodiazepine drugs, including diazepam.    Morphine                       >7194                   ng/mg creat   Normorphine                    435                     ng/mg creat    Potential sources of large amounts of morphine in the absence of    codeine include administration of morphine or use of heroin.     Normorphine is an expected metabolite of morphine.    Hydromorphone                   209                     ng/mg creat    Hydromorphone may be present as a metabolite of morphine;    concentrations of hydromorphone rarely exceed 5% of the morphine    concentration when this is the source of hydromorphone.    Buprenorphine                  128                     ng/mg creat   Norbuprenorphine               >719                    ng/mg creat   Naloxone                       39                      ng/mg creat    Source of buprenorphine is a scheduled prescription medication.    Norbuprenorphine is an expected metabolite of buprenorphine.  ====================================================================  Test                      Result    Flag   Units      Ref Range   Creatinine              139              mg/dL      >=16 ==================================================================== Declared Medications:  Medication list was not provided. ==================================================================== For clinical consultation, ple ase call [phone]. ====================================================================        Assessment & Plan:   Problem List Items Addressed This Visit       Active Problems    Chronic, continuous use of opioids    Patient gets her suboxone from Dr. Huston Foley at [facility].  She uses this for pain relief, not for opioid abuse.  She has been educated repeatedly on the risks of her medications and the risk of overdose.  She does have narcan available for use readily, and she as well as all of her family members are aware of how to administer this.   I have been in contact with Dr. Huston Foley previously, and we are in agreement regarding her POC.  We are in the process of trying to find someone who IS willing to do her hip surgery, as she has been to [facility] and they declined to do this as a result of her age.   I have also advised her that I will need her to be set up with a pain clinic to continue her pain medications going forward.  I will set her up for a referral to [facility] for pain management, and hopefully we can work in concert with them and Dr. Huston Foley to manage her issues.       Chronic pain - Primary    Patient gets her suboxone from Dr. Huston Foley at [facility].  She uses this for pain relief, not for opioid abuse.  She has been educated repeatedly on the risks of her medications and the risk of overdose.  She does have narcan available for use readily, and she as well as all of her family members are aware of how to administer this.   I have been in contact with Dr. Huston Foley previously, and we are in agreement regarding her POC.  We are in the process of trying to find someone who IS willing to do her hip surgery, as she has been to [facility] and they declined to do this as a result of her age.   I have also advised her that I will need her to be set up with a pain clinic to continue her pain medications going forward.  I will set her up for a referral to [facility] for pain management, and hopefully we can work in concert with them and Dr. Huston Foley to manage her issues.       Relevant Medications    buprenorphine-naloxone (SUBOXONE) 8-2 mg SUBL SL tablet   Arthralgia of hip    I will follow up on Referral to Ortho, so we can get her set up with them as soon as possible.  She is still having significant discomfort, and we will continue her pain medications.   I will bridge the patient's medications until we can get her in with the pain clinic.       Generalized anxiety disorder    Well managed with current  regimen.  Doing much better than with the Diazepam.  Patient has been educated on use of the alprazolam with her other medications, and the importance of not taking these medications at the same time.   Will continue to reassess going forward.       PTSD (post-traumatic stress disorder)    Well managed with current regimen.  Doing much better than with the Diazepam.  Patient has been educated on use of the alprazolam with her other medications, and the importance of not taking these medications at the same time.   Will continue to reassess going forward.        Return in about 1 month (around 01/15/2023).   Total time spent: 30 minutes  Miki Kins, FNP  12/15/2022   This document may have been prepared by [facility] Voice Recognition software and as such may include unintentional dictation errors.

## 2022-12-20 ENCOUNTER — Encounter: Payer: Self-pay | Admitting: Family

## 2022-12-20 DIAGNOSIS — F4310 Post-traumatic stress disorder, unspecified: Secondary | ICD-10-CM | POA: Insufficient documentation

## 2022-12-20 NOTE — Assessment & Plan Note (Signed)
Patient gets her suboxone from Dr. Brady at [facility].  She uses this for pain relief, not for opioid abuse.  She has been educated repeatedly on the risks of her medications and the risk of overdose.  She does have narcan available for use readily, and she as well as all of her family members are aware of how to administer this.   I have been in contact with Dr. Brady previously, and we are in agreement regarding her POC.  We are in the process of trying to find someone who IS willing to do her hip surgery, as she has been to [facility] and they declined to do this as a result of her age.   I have also advised her that I will need her to be set up with a pain clinic to continue her pain medications going forward.  I will set her up for a referral to [facility] for pain management, and hopefully we can work in concert with them and Dr. Brady to manage her issues.  

## 2022-12-20 NOTE — Assessment & Plan Note (Addendum)
Patient gets her suboxone from Dr. Huston Foley at [facility].  She uses this for pain relief, not for opioid abuse.  She has been educated repeatedly on the risks of her medications and the risk of overdose.  She does have narcan available for use readily, and she as well as all of her family members are aware of how to administer this.   I have been in contact with Dr. Huston Foley previously, and we are in agreement regarding her POC.  We are in the process of trying to find someone who IS willing to do her hip surgery, as she has been to [facility] and they declined to do this as a result of her age.   I have also advised her that I will need her to be set up with a pain clinic to continue her pain medications going forward.  I will set her up for a referral to [facility] for pain management, and hopefully we can work in concert with them and Dr. Huston Foley to manage her issues.

## 2022-12-20 NOTE — Assessment & Plan Note (Signed)
I will follow up on Referral to Ortho, so we can get her set up with them as soon as possible.  She is still having significant discomfort, and we will continue her pain medications.   I will bridge the patient's medications until we can get her in with the pain clinic.

## 2022-12-20 NOTE — Assessment & Plan Note (Addendum)
Well managed with current regimen.  Doing much better than with the Diazepam.  Patient has been educated on use of the alprazolam with her other medications, and the importance of not taking these medications at the same time.   Will continue to reassess going forward.

## 2023-01-06 ENCOUNTER — Telehealth: Payer: Self-pay | Admitting: Family

## 2023-01-06 ENCOUNTER — Other Ambulatory Visit: Payer: Self-pay | Admitting: Family

## 2023-01-06 NOTE — Telephone Encounter (Signed)
Patient left VM and called and spoke with me wanting her pain meds to be sent in already. Advised her that it was still a few days to early for her to get them. Patient states she's "going out of town".

## 2023-01-07 ENCOUNTER — Other Ambulatory Visit: Payer: Self-pay | Admitting: Family

## 2023-01-07 MED ORDER — OXYCODONE HCL 10 MG PO TABS: 10.0000 mg | ORAL_TABLET | Freq: Two times a day (BID) | ORAL | 0 refills | Status: DC | PRN

## 2023-01-15 ENCOUNTER — Ambulatory Visit: Payer: BC Managed Care – PPO | Admitting: Family

## 2023-01-19 ENCOUNTER — Encounter: Payer: Self-pay | Admitting: Family

## 2023-01-20 ENCOUNTER — Encounter: Payer: Self-pay | Admitting: Family

## 2023-01-29 ENCOUNTER — Other Ambulatory Visit: Payer: Self-pay | Admitting: Family

## 2023-01-29 NOTE — Telephone Encounter (Signed)
[facility] left [facility] She also states that she has an appt with the pain clinic on 8/5.

## 2023-01-31 ENCOUNTER — Other Ambulatory Visit: Payer: Self-pay | Admitting: Family

## 2023-01-31 MED ORDER — ALPRAZOLAM 0.5 MG PO TABS: 0.5000 mg | ORAL_TABLET | Freq: Three times a day (TID) | ORAL | 1 refills | Status: DC | PRN

## 2023-02-01 ENCOUNTER — Other Ambulatory Visit: Payer: Self-pay | Admitting: Family

## 2023-02-03 ENCOUNTER — Other Ambulatory Visit: Payer: Self-pay | Admitting: Family

## 2023-02-03 MED ORDER — OXYCODONE HCL 10 MG PO TABS: 10.0000 mg | ORAL_TABLET | Freq: Two times a day (BID) | ORAL | 0 refills | Status: DC | PRN

## 2023-02-03 MED ORDER — MEDROXYPROGESTERONE ACETATE 150 MG/ML IM SUSY: 1.0000 mL | PREFILLED_SYRINGE | INTRAMUSCULAR | 3 refills | Status: DC

## 2023-02-04 ENCOUNTER — Ambulatory Visit: Payer: BC Managed Care – PPO | Admitting: Family

## 2023-02-10 ENCOUNTER — Telehealth: Payer: Self-pay | Admitting: Family

## 2023-02-10 ENCOUNTER — Ambulatory Visit: Payer: BC Managed Care – PPO | Admitting: Family

## 2023-02-10 NOTE — Telephone Encounter (Signed)
Patient left VM that she has been sick a lot and needs a letter stating that she cannot work. Needs this letter because she is "trying to get help". Please advise.

## 2023-02-24 ENCOUNTER — Other Ambulatory Visit: Payer: Self-pay | Admitting: Orthopedic Surgery

## 2023-02-24 DIAGNOSIS — M25851 Other specified joint disorders, right hip: Secondary | ICD-10-CM

## 2023-03-02 ENCOUNTER — Other Ambulatory Visit: Payer: Self-pay | Admitting: Family

## 2023-03-02 MED ORDER — ALPRAZOLAM 0.5 MG PO TABS: 0.5000 mg | ORAL_TABLET | Freq: Three times a day (TID) | ORAL | 0 refills | Status: DC | PRN

## 2023-03-03 ENCOUNTER — Other Ambulatory Visit: Payer: Self-pay | Admitting: Family

## 2023-03-04 ENCOUNTER — Telehealth: Payer: Self-pay | Admitting: Family

## 2023-03-04 ENCOUNTER — Encounter: Payer: Self-pay | Admitting: Family
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
## 2023-03-13 ENCOUNTER — Other Ambulatory Visit: Payer: 59

## 2023-03-19 ENCOUNTER — Ambulatory Visit: Payer: BC Managed Care – PPO | Admitting: Family

## 2023-03-22 ENCOUNTER — Other Ambulatory Visit: Payer: Self-pay | Admitting: Family

## 2023-03-27 ENCOUNTER — Ambulatory Visit (INDEPENDENT_AMBULATORY_CARE_PROVIDER_SITE_OTHER): Payer: BC Managed Care – PPO | Admitting: Family

## 2023-03-27 VITALS — BP 110/70 | HR 113 | Ht <= 58 in | Wt 152.0 lb

## 2023-03-27 DIAGNOSIS — M25552 Pain in left hip: Secondary | ICD-10-CM

## 2023-03-27 DIAGNOSIS — Z9189 Other specified personal risk factors, not elsewhere classified: Secondary | ICD-10-CM

## 2023-03-27 DIAGNOSIS — G894 Chronic pain syndrome: Secondary | ICD-10-CM

## 2023-03-27 DIAGNOSIS — M25551 Pain in right hip: Secondary | ICD-10-CM

## 2023-03-27 DIAGNOSIS — F411 Generalized anxiety disorder: Secondary | ICD-10-CM

## 2023-03-27 DIAGNOSIS — F32A Depression, unspecified: Secondary | ICD-10-CM

## 2023-03-30 ENCOUNTER — Telehealth: Payer: Self-pay

## 2023-03-30 NOTE — Telephone Encounter (Signed)
Please let pt know that her letter for disability is ready for pick up

## 2023-03-30 NOTE — Telephone Encounter (Signed)
Pt.notified

## 2023-04-02 ENCOUNTER — Encounter: Payer: Self-pay | Admitting: Orthopedic Surgery

## 2023-04-04 ENCOUNTER — Other Ambulatory Visit: Payer: 59

## 2023-04-06 ENCOUNTER — Ambulatory Visit: Payer: BC Managed Care – PPO | Admitting: Family

## 2023-04-07 ENCOUNTER — Ambulatory Visit (INDEPENDENT_AMBULATORY_CARE_PROVIDER_SITE_OTHER): Payer: BC Managed Care – PPO | Admitting: Family

## 2023-04-07 VITALS — BP 105/75 | HR 105 | Ht <= 58 in | Wt 152.6 lb

## 2023-04-07 DIAGNOSIS — W11XXXA Fall on and from ladder, initial encounter: Secondary | ICD-10-CM

## 2023-04-07 DIAGNOSIS — G894 Chronic pain syndrome: Secondary | ICD-10-CM | POA: Diagnosis not present

## 2023-04-07 NOTE — Progress Notes (Signed)
Established Patient Office Visit  Subjective:  Patient ID: Karen [name], female    DOB: Mar 17, 1981  Age: 42 y.o. MRN: [id number]  Chief Complaint  Patient presents with   Follow-up    Fell down,swollen ankles    Patient is here after  recent fall. She was on a ladder trying to hang up a curtain rod, and says her hip gave out on her.  She fell from the step ladder and fell into the floor.  She could not get up easily, and had to call her husband for help.  She has significant bruising to her left side.   She also says that she went to the pain clinic and she did not see her previous provider, saw a different one who refused to fill her meds and she has been released from the practice.  She also has not been to see the suboxone providers, as the pain clinic told her she could not be treated there if she was at the [facility] clinic as well.       No other concerns at this time.   Past Medical History:  Diagnosis Date   Allergy    Anxiety    Asthma    GERD (gastroesophageal reflux disease)    Internal hemorrhoids     Past Surgical History:  Procedure Laterality Date   CARPAL TUNNEL RELEASE Bilateral 2010   CESAREAN SECTION  2009   COLONOSCOPY     EPIGASTRIC HERNIA REPAIR N/A 09/03/2015   Procedure: HERNIA REPAIR EPIGASTRIC ADULT;  Surgeon: Earline Mayotte, MD;  Location: [facility] ORS;  Service: General;  Laterality: N/A;   HIP SURGERY  2013   [facility]ST [facility]ERY  2002   [facility] [place]   TONSILLECTOMY  2010    Social History   Socioeconomic History   Marital status: Married    Spouse name: Not on file   Number of children: Not on file   Years of education: Not on file   Highest education level: Not on file  Occupational History   Not on file  Tobacco Use   Smoking status: Former    Current packs/day: 0.00    Average packs/day: 0.5 packs/day for 18.0 years (9.0 ttl pk-yrs)    Types: Cigarettes    Start date: 07/21/1996    Quit date: 07/21/2014    Years  since quitting: 8.7   Smokeless tobacco: Never  Substance and Sexual Activity   Alcohol use: No    Alcohol/week: 0.0 standard drinks of alcohol   Drug use: No   Sexual activity: Yes  Other Topics Concern   Not on file  Social History Narrative   Not on file   Social Determinants of Health   Financial Resource Strain: Not on file  Food Insecurity: Not on file  Transportation Needs: Not on file  Physical Activity: Not on file  Stress: Not on file  Social Connections: Not on file  Intimate Partner Violence: Not on file    Family History  Problem Relation Age of Onset   COPD Mother    COPD Father     Allergies  Allergen Reactions   Benadryl [Diphenhydramine Hcl] Other (See Comments)    Leg jumpy   Cyclobenzaprine Other (See Comments)    Other reaction(s): "legs jumpy"  Restless legs   Ketorolac Hives   Motrin [Ibuprofen] Hives   Ketorolac Tromethamine Rash   Tramadol Rash and Hives    Review of Systems  Musculoskeletal:  Positive for back pain,  falls, joint pain and myalgias.  Skin:        Bruising on multiple surfaces  All other systems reviewed and are negative.      Objective:   BP 105/75   Pulse (!) 105   Ht 4\' 8"  (1.422 m)   Wt 152 lb 9.6 oz (69.2 kg)   LMP 09/27/2016   SpO2 98%   BMI 34.21 kg/m   Vitals:   04/07/23 1056  BP: 105/75  Pulse: (!) 105  Height: 4\' 8"  (1.422 m)  Weight: 152 lb 9.6 oz (69.2 kg)  SpO2: 98%  BMI (Calculated): 34.23    Physical Exam Vitals and nursing note reviewed.  Constitutional:      Appearance: Normal appearance. She is normal weight.  HENT:     Head: Normocephalic.  Eyes:     Extraocular Movements: Extraocular movements intact.     Conjunctiva/sclera: Conjunctivae normal.     Pupils: Pupils are equal, round, and reactive to light.  Cardiovascular:     Rate and Rhythm: Normal rate.  Pulmonary:     Effort: Pulmonary effort is normal.  Neurological:     General: No focal deficit present.     Mental  Status: She is alert and oriented to person, place, and time. Mental status is at baseline.  Psychiatric:        Mood and Affect: Mood normal.        Behavior: Behavior normal.        Thought Content: Thought content normal.        Judgment: Judgment normal.      No results found for any visits on 04/07/23.  No results found for this or any previous visit (from the past 2160 hour(s)).     Assessment & Plan:   Problem List Items Addressed This Visit       Active Problems   Chronic pain   Other Visit Diagnoses     Accidental fall from ladder, initial encounter    -  Primary   encouraged pt. to continue to use the treatments she has been using and to call the pain clinic asap.       No follow-ups on file.   Total time spent: 20 minutes  Miki Kins, FNP  04/07/2023   This document may have been prepared by [facility] Voice Recognition software and as such may include unintentional dictation errors.

## 2023-04-12 ENCOUNTER — Encounter: Payer: Self-pay | Admitting: Family

## 2023-04-12 NOTE — Progress Notes (Signed)
Acute Office Visit  Subjective:     Patient ID: Karen [name], female    DOB: 21-Jul-1981, 42 y.o.   MRN: [id number]  Patient is in today for  Chief Complaint  Patient presents with   Follow-up    F/u    Patient is here today for her 3 months follow up.  She has been feeling fairly well since last appointment.   She does not have additional concerns to discuss today.  She has been seen at the pain clinic.  Is working with Ortho at [facility] to get her surgery taken care of.   Labs are due today. She needs refills.   I have reviewed her active problem list, medication list, allergies, notes from last encounter, lab results for her appointment today.     Review of Systems  Musculoskeletal:  Positive for joint pain.  All other systems reviewed and are negative.     Objective:    BP 110/70   Pulse (!) 113   Ht 4\' 8"  (1.422 m)   Wt 152 lb (68.9 kg)   LMP 09/27/2016   SpO2 98%   BMI 34.08 kg/m   Physical Exam Vitals and nursing note reviewed.  Constitutional:      Appearance: Normal appearance. She is normal weight.  HENT:     Head: Normocephalic.  Eyes:     Pupils: Pupils are equal, round, and reactive to light.  Cardiovascular:     Rate and Rhythm: Normal rate.  Pulmonary:     Effort: Pulmonary effort is normal.  Musculoskeletal:     Right hip: Tenderness, bony tenderness and crepitus present. Decreased range of motion. Decreased strength.  Neurological:     General: No focal deficit present.     Mental Status: She is alert and oriented to person, place, and time. Mental status is at baseline.  Psychiatric:        Attention and Perception: Attention and perception normal.        Mood and Affect: Mood and affect normal.        Speech: Speech normal.        Behavior: Behavior normal. Behavior is cooperative.        Thought Content: Thought content normal.        Cognition and Memory: Cognition and memory normal.        Judgment: Judgment normal.     No  results found for any visits on 03/27/23.  No results found for this or any previous visit (from the past 2160 hour(s)).     Assessment & Plan:   Problem List Items Addressed This Visit       Active Problems   At risk for abuse of opiates - Primary   Chronic pain   Depressive disorder   Anxiety state   Arthralgia of hip   Generalized anxiety disorder    Continue current therapy.  Sending refills for her meds.  Will defer to ortho regarding her hip.   Return in about 3 months (around 06/26/2023).  Total time spent: 20 minutes  Miki Kins, FNP  03/27/2023   This document may have been prepared by [facility] Voice Recognition software and as such may include unintentional dictation errors.

## 2023-04-30 ENCOUNTER — Telehealth: Payer: Self-pay

## 2023-05-07 ENCOUNTER — Telehealth: Payer: Self-pay | Admitting: Family

## 2023-05-07 NOTE — Telephone Encounter (Signed)
Patient called in requesting a script for pain meds or how she can get put back on it. States she is suffering and cannot do PT without pain meds and they won't approve surgery until she does PT.

## 2023-05-25 ENCOUNTER — Other Ambulatory Visit: Payer: Self-pay

## 2023-06-04 ENCOUNTER — Ambulatory Visit: Payer: BC Managed Care – PPO | Admitting: Family

## 2023-06-11 ENCOUNTER — Telehealth: Payer: Self-pay

## 2023-06-11 NOTE — Telephone Encounter (Signed)
Patient called wanting a refill on Alprazolam.

## 2023-06-15 ENCOUNTER — Other Ambulatory Visit: Payer: Self-pay | Admitting: Family

## 2023-06-16 ENCOUNTER — Other Ambulatory Visit: Payer: Self-pay | Admitting: Family

## 2023-06-29 ENCOUNTER — Other Ambulatory Visit: Payer: Self-pay | Admitting: Family

## 2023-06-29 MED ORDER — ONDANSETRON 4 MG PO TBDP: 4.0000 mg | ORAL_TABLET | Freq: Three times a day (TID) | ORAL | 1 refills | Status: DC | PRN

## 2023-08-12 ENCOUNTER — Encounter: Payer: Self-pay | Admitting: Orthopedic Surgery

## 2023-08-18 ENCOUNTER — Other Ambulatory Visit: Payer: BC Managed Care – PPO

## 2023-09-01 ENCOUNTER — Ambulatory Visit (INDEPENDENT_AMBULATORY_CARE_PROVIDER_SITE_OTHER): Payer: Medicaid Other | Admitting: Family

## 2023-09-01 ENCOUNTER — Encounter: Payer: Self-pay | Admitting: Family

## 2023-09-01 VITALS — BP 126/76 | HR 73 | Ht <= 58 in | Wt 141.4 lb

## 2023-09-01 DIAGNOSIS — B80 Enterobiasis: Secondary | ICD-10-CM

## 2023-09-01 DIAGNOSIS — F411 Generalized anxiety disorder: Secondary | ICD-10-CM | POA: Diagnosis not present

## 2023-09-01 DIAGNOSIS — Z0130 Encounter for examination of blood pressure without abnormal findings: Secondary | ICD-10-CM

## 2023-09-01 DIAGNOSIS — M25551 Pain in right hip: Secondary | ICD-10-CM | POA: Diagnosis not present

## 2023-09-01 DIAGNOSIS — M25552 Pain in left hip: Secondary | ICD-10-CM | POA: Diagnosis not present

## 2023-09-01 MED ORDER — MEBENDAZOLE 100 MG PO CHEW: CHEWABLE_TABLET | ORAL | 0 refills | Status: DC

## 2023-09-01 MED ORDER — DIAZEPAM 2 MG PO TABS: 2.0000 mg | ORAL_TABLET | Freq: Two times a day (BID) | ORAL | 2 refills | Status: DC

## 2023-09-01 MED ORDER — OXYCODONE HCL 10 MG PO TABS: 10.0000 mg | ORAL_TABLET | Freq: Two times a day (BID) | ORAL | 0 refills | Status: DC | PRN

## 2023-09-26 ENCOUNTER — Ambulatory Visit
Admission: RE | Admit: 2023-09-26 | Discharge: 2023-09-26 | Disposition: A | Discharge status: Home / Self Care | Source: Ambulatory Visit | Attending: Orthopedic Surgery | Admitting: Orthopedic Surgery

## 2023-09-26 ENCOUNTER — Other Ambulatory Visit: Payer: Self-pay | Admitting: Family

## 2023-09-26 DIAGNOSIS — M25851 Other specified joint disorders, right hip: Secondary | ICD-10-CM

## 2023-09-28 ENCOUNTER — Other Ambulatory Visit: Payer: Self-pay | Admitting: Family

## 2023-09-30 MED ORDER — OXYCODONE HCL 10 MG PO TABS: 10.0000 mg | ORAL_TABLET | Freq: Two times a day (BID) | ORAL | 0 refills | Status: DC | PRN

## 2023-10-13 ENCOUNTER — Ambulatory Visit: Payer: Medicaid Other | Admitting: Family

## 2023-10-28 ENCOUNTER — Other Ambulatory Visit: Payer: Self-pay | Admitting: Family

## 2023-10-29 ENCOUNTER — Other Ambulatory Visit: Payer: Self-pay

## 2023-10-29 MED ORDER — OXYCODONE HCL 10 MG PO TABS: 10.0000 mg | ORAL_TABLET | Freq: Two times a day (BID) | ORAL | 0 refills | Status: DC | PRN

## 2023-11-01 ENCOUNTER — Encounter: Payer: Self-pay | Admitting: Family

## 2023-11-01 NOTE — Assessment & Plan Note (Signed)
 Will send refill of pain medications for patient.  Awaiting surgery.   Patient is aware we can only send for a short period of time.

## 2023-11-01 NOTE — Assessment & Plan Note (Signed)
 Patient stable.  Well controlled with current therapy.   Continue current meds.

## 2023-11-01 NOTE — Progress Notes (Signed)
 Established Patient Office Visit  Subjective:  Patient ID: Karen [name], female    DOB: 18-Jul-1981  Age: 43 y.o. MRN: [id number]  Chief Complaint  Patient presents with   Follow-up    Discuss medications    Patient is here today for her  follow up.  She has been feeling fairly well since last appointment.   She does have additional concerns to discuss today.  1) She thinks she may have a case of pinworms, as she has been dealing with itching and found some with wiping. 2) Is set up for her hip replacement soon, but needs refills until then.   Labs are not due today. She needs refills.   I have reviewed her active problem list, medication list, allergies, notes from last encounter, lab results for her appointment today.      No other concerns at this time.   Past Medical History:  Diagnosis Date   Allergy    Anxiety    Asthma    GERD (gastroesophageal reflux disease)    Internal hemorrhoids     Past Surgical History:  Procedure Laterality Date   CARPAL TUNNEL RELEASE Bilateral 2010   CESAREAN SECTION  2009   COLONOSCOPY     EPIGASTRIC HERNIA REPAIR N/A 09/03/2015   Procedure: HERNIA REPAIR EPIGASTRIC ADULT;  Surgeon: Marshall Skeeter, MD;  Location: [facility] ORS;  Service: General;  Laterality: N/A;   HIP SURGERY  2013   [facility]IAN CYST [facility]GERY  2002   [facility] [place]   TONSILLECTOMY  2010    Social History   Socioeconomic History   Marital status: Married    Spouse name: Not on file   Number of children: Not on file   Years of education: Not on file   Highest education level: Not on file  Occupational History   Not on file  Tobacco Use   Smoking status: Former    Current packs/day: 0.00    Average packs/day: 0.5 packs/day for 18.0 years (9.0 ttl pk-yrs)    Types: Cigarettes    Start date: 07/21/1996    Quit date: 07/21/2014    Years since quitting: 9.2   Smokeless tobacco: Never  Substance and Sexual Activity   Alcohol use: No    Alcohol/week:  0.0 standard drinks of alcohol   Drug use: No   Sexual activity: Yes  Other Topics Concern   Not on file  Social History Narrative   Not on file   Social Drivers of Health   Financial Resource Strain: Not on file  Food Insecurity: Not on file  Transportation Needs: Not on file  Physical Activity: Not on file  Stress: Not on file  Social Connections: Not on file  Intimate Partner Violence: Not on file    Family History  Problem Relation Age of Onset   COPD Mother    COPD Father     Allergies  Allergen Reactions   Benadryl [Diphenhydramine Hcl] Other (See Comments)    Leg jumpy   Cyclobenzaprine Other (See Comments)    Other reaction(s): "legs jumpy"  Restless legs   Ketorolac Hives   Motrin [Ibuprofen] Hives   Ketorolac Tromethamine Rash   Tramadol Rash and Hives    Review of Systems  Musculoskeletal:  Positive for back pain and joint pain.  Neurological:  Positive for tremors.  All other systems reviewed and are negative.      Objective:   BP 126/76   Pulse 73   Ht 4\' 8"  (1.422  m)   Wt 141 lb 6.4 oz (64.1 kg)   LMP 09/27/2016   SpO2 98%   BMI 31.70 kg/m   Vitals:   09/01/23 1119  BP: 126/76  Pulse: 73  Height: 4\' 8"  (1.422 m)  Weight: 141 lb 6.4 oz (64.1 kg)  SpO2: 98%  BMI (Calculated): 31.72    Physical Exam Vitals and nursing note reviewed.  Constitutional:      Appearance: Normal appearance. She is normal weight.  HENT:     Head: Normocephalic.  Eyes:     Extraocular Movements: Extraocular movements intact.     Conjunctiva/sclera: Conjunctivae normal.     Pupils: Pupils are equal, round, and reactive to light.  Cardiovascular:     Rate and Rhythm: Normal rate.  Pulmonary:     Effort: Pulmonary effort is normal.  Musculoskeletal:     Right hip: Tenderness, bony tenderness and crepitus present. Decreased range of motion. Decreased strength.     Left hip: Tenderness, bony tenderness and crepitus present. Decreased range of motion.  Decreased strength.  Neurological:     General: No focal deficit present.     Mental Status: She is alert and oriented to person, place, and time. Mental status is at baseline.  Psychiatric:        Mood and Affect: Mood normal.        Behavior: Behavior normal.        Thought Content: Thought content normal.        Judgment: Judgment normal.      No results found for any visits on 09/01/23.  No results found for this or any previous visit (from the past 2160 hours).     Assessment & Plan:   Problem List Items Addressed This Visit       Other   Anxiety state   Patient stable.  Well controlled with current therapy.   Continue current meds.        Relevant Medications   diazepam (VALIUM) 2 MG tablet   Arthralgia of hip - Primary   Will send refill of pain medications for patient.  Awaiting surgery.   Patient is aware we can only send for a short period of time.      Other Visit Diagnoses       Pinworms       Sending mebendazole rx for pt.  She will let me know if this does not improve.       Return in about 3 months (around 11/29/2023) for F/U.   Total time spent: 20 minutes  Trenda Frisk, FNP  09/01/2023   This document may have been prepared by [facility] Voice Recognition software and as such may include unintentional dictation errors.

## 2023-11-13 ENCOUNTER — Other Ambulatory Visit: Payer: Self-pay | Admitting: Orthopedic Surgery

## 2023-11-16 ENCOUNTER — Encounter: Payer: Self-pay | Admitting: Family

## 2023-11-16 ENCOUNTER — Ambulatory Visit: Admitting: Family

## 2023-11-16 VITALS — BP 110/76 | HR 63 | Ht <= 58 in | Wt 144.4 lb

## 2023-11-16 DIAGNOSIS — M25552 Pain in left hip: Secondary | ICD-10-CM | POA: Diagnosis not present

## 2023-11-16 DIAGNOSIS — M25551 Pain in right hip: Secondary | ICD-10-CM

## 2023-11-16 DIAGNOSIS — Z01818 Encounter for other preprocedural examination: Secondary | ICD-10-CM

## 2023-11-16 DIAGNOSIS — G894 Chronic pain syndrome: Secondary | ICD-10-CM | POA: Diagnosis not present

## 2023-11-16 DIAGNOSIS — Z0130 Encounter for examination of blood pressure without abnormal findings: Secondary | ICD-10-CM

## 2023-11-16 MED ORDER — OXYCODONE HCL 10 MG PO TABS: 10.0000 mg | ORAL_TABLET | Freq: Three times a day (TID) | ORAL | 0 refills | Status: DC | PRN

## 2023-11-16 MED ORDER — VORTIOXETINE HBR 20 MG PO TABS: 20.0000 mg | ORAL_TABLET | Freq: Every day | ORAL | 1 refills | Status: DC

## 2023-11-16 MED ORDER — DIAZEPAM 2 MG PO TABS: 2.0000 mg | ORAL_TABLET | Freq: Two times a day (BID) | ORAL | 2 refills | Status: DC

## 2023-11-17 ENCOUNTER — Other Ambulatory Visit: Payer: Self-pay | Admitting: Family

## 2023-11-20 ENCOUNTER — Other Ambulatory Visit: Payer: Self-pay

## 2023-11-20 ENCOUNTER — Encounter
Admission: RE | Admit: 2023-11-20 | Discharge: 2023-11-20 | Disposition: A | Discharge status: Home / Self Care | Source: Ambulatory Visit | Attending: Orthopedic Surgery | Admitting: Orthopedic Surgery

## 2023-11-20 VITALS — BP 113/74 | HR 70 | Resp 16 | Ht <= 58 in | Wt 141.8 lb

## 2023-11-20 DIAGNOSIS — Z01812 Encounter for preprocedural laboratory examination: Secondary | ICD-10-CM

## 2023-11-20 DIAGNOSIS — Z01818 Encounter for other preprocedural examination: Secondary | ICD-10-CM | POA: Diagnosis present

## 2023-11-20 HISTORY — DX: Unilateral primary osteoarthritis, right hip: M16.11

## 2023-11-20 HISTORY — DX: Pneumonia, unspecified organism: J18.9

## 2023-11-20 LAB — CBC WITH DIFFERENTIAL/PLATELET
Abs Immature Granulocytes: 0.02 10*3/uL (ref 0.00–0.07)
Basophils Absolute: 0.1 10*3/uL (ref 0.0–0.1)
Basophils Relative: 1 %
Eosinophils Absolute: 0.3 10*3/uL (ref 0.0–0.5)
Eosinophils Relative: 6 %
HCT: 37.6 % (ref 36.0–46.0)
Hemoglobin: 11.2 g/dL — ABNORMAL LOW (ref 12.0–15.0)
Immature Granulocytes: 0 %
Lymphocytes Relative: 22 %
Lymphs Abs: 1.3 10*3/uL (ref 0.7–4.0)
MCH: 24.1 pg — ABNORMAL LOW (ref 26.0–34.0)
MCHC: 29.8 g/dL — ABNORMAL LOW (ref 30.0–36.0)
MCV: 81 fL (ref 80.0–100.0)
Monocytes Absolute: 0.4 10*3/uL (ref 0.1–1.0)
Monocytes Relative: 7 %
Neutro Abs: 3.7 10*3/uL (ref 1.7–7.7)
Neutrophils Relative %: 64 %
Platelets: 305 10*3/uL (ref 150–400)
RBC: 4.64 MIL/uL (ref 3.87–5.11)
RDW: 15.8 % — ABNORMAL HIGH (ref 11.5–15.5)
WBC: 5.9 10*3/uL (ref 4.0–10.5)
nRBC: 0 % (ref 0.0–0.2)

## 2023-11-20 LAB — URINALYSIS, ROUTINE W REFLEX MICROSCOPIC
Bilirubin Urine: NEGATIVE
Glucose, UA: NEGATIVE mg/dL
Hgb urine dipstick: NEGATIVE
Ketones, ur: NEGATIVE mg/dL
Nitrite: NEGATIVE
Protein, ur: NEGATIVE mg/dL
Specific Gravity, Urine: 1.024 (ref 1.005–1.030)
pH: 5 (ref 5.0–8.0)

## 2023-11-20 LAB — SURGICAL PCR SCREEN
MRSA, PCR: NEGATIVE
Staphylococcus aureus: POSITIVE — AB

## 2023-11-20 LAB — TYPE AND SCREEN
ABO/RH(D): O POS
Antibody Screen: NEGATIVE

## 2023-11-20 LAB — COMPREHENSIVE METABOLIC PANEL WITH GFR
ALT: 13 U/L (ref 0–44)
AST: 18 U/L (ref 15–41)
Albumin: 3.8 g/dL (ref 3.5–5.0)
Alkaline Phosphatase: 74 U/L (ref 38–126)
Anion gap: 10 (ref 5–15)
BUN: 10 mg/dL (ref 6–20)
CO2: 25 mmol/L (ref 22–32)
Calcium: 8.5 mg/dL — ABNORMAL LOW (ref 8.9–10.3)
Chloride: 103 mmol/L (ref 98–111)
Creatinine, Ser: 1.06 mg/dL — ABNORMAL HIGH (ref 0.44–1.00)
GFR, Estimated: >60 mL/min (ref >60)
Glucose, Bld: 83 mg/dL (ref 70–99)
Potassium: 3.7 mmol/L (ref 3.5–5.1)
Sodium: 138 mmol/L (ref 135–145)
Total Bilirubin: 0.3 mg/dL (ref 0.0–1.2)
Total Protein: 7.2 g/dL (ref 6.5–8.1)

## 2023-11-20 NOTE — Patient Instructions (Addendum)
 Your procedure is scheduled on: 11/26/23 - Thursday Report to the Registration Desk on the 1st floor of the [facility]. To find out your arrival time, please call [phone] between 1PM - 3PM on: 11/25/23 - Wednesday If your arrival time is 6:00 am, do not arrive before that time as the [facility] entrance doors do not open until 6:00 am.  REMEMBER: Instructions that are not followed completely may result in serious medical risk, up to and including death; or upon the discretion of your surgeon and anesthesiologist your surgery may need to be rescheduled.  Do not eat food after midnight the night before surgery.  No gum chewing or hard candies.  You may however, drink CLEAR liquids up to 2 hours before you are scheduled to arrive for your surgery. Do not drink anything within 2 hours of your scheduled arrival time.  Clear liquids include: - water  - apple juice without pulp - gatorade (not RED colors) - black coffee or tea (Do NOT add milk or creamers to the coffee or tea) Do NOT drink anything that is not on this list.  In addition, your doctor has ordered for you to drink the provided:  Ensure Pre-Surgery Clear Carbohydrate Drink  Drinking this carbohydrate drink up to two hours before surgery helps to reduce insulin  resistance and improve patient outcomes. Please complete drinking 2 hours before scheduled arrival time.  One week prior to surgery: Stop Anti-inflammatories (NSAIDS) such as Advil, Aleve, Ibuprofen, Motrin, Naproxen, Naprosyn and Aspirin based products such as Excedrin, Goody's Powder, BC Powder.  Stop ANY OVER THE COUNTER supplements until after surgery.  You may however, continue to take Tylenol  if needed for pain up until the day of surgery.   ON THE DAY OF SURGERY ONLY TAKE THESE MEDICATIONS WITH SIPS OF WATER:  diazepam  (VALIUM )  Oxycodone  if needed ondansetron  (ZOFRAN -ODT) if needed  Use inhaler BREZTRI AEROSPHERE  on the day of surgery and bring to  the hospital.   No Alcohol for 24 hours before or after surgery.  No Smoking including e-cigarettes for 24 hours before surgery.  No chewable tobacco products for at least 6 hours before surgery.  No nicotine patches on the day of surgery.  Do not use any "recreational" drugs for at least a week (preferably 2 weeks) before your surgery.  Please be advised that the combination of cocaine and anesthesia may have negative outcomes, up to and including death. If you test positive for cocaine, your surgery will be cancelled.  On the morning of surgery brush your teeth with toothpaste and water, you may rinse your mouth with mouthwash if you wish. Do not swallow any toothpaste or mouthwash.  Use CHG Soap or wipes as directed on instruction sheet.  Do not wear jewelry, make-up, hairpins, clips or nail polish.  For welded (permanent) jewelry: bracelets, anklets, waist bands, etc.  Please have this removed prior to surgery.  If it is not removed, there is a chance that hospital personnel will need to cut it off on the day of surgery.  Do not wear lotions, powders, or perfumes.   Do not shave body hair from the neck down 48 hours before surgery.  Contact lenses, hearing aids and dentures may not be worn into surgery.  Do not bring valuables to the hospital. [facility] is not responsible for any missing/lost belongings or valuables.   Notify your doctor if there is any change in your medical condition (cold, fever, infection).  Wear comfortable clothing (  specific to your surgery type) to the hospital.  After surgery, you can help prevent lung complications by doing breathing exercises.  Take deep breaths and cough every 1-2 hours. Your doctor may order a device called an Incentive Spirometer to help you take deep breaths.  When coughing or sneezing, hold a pillow firmly against your incision with both hands. This is called "splinting." Doing this helps protect your incision. It also  decreases belly discomfort.  If you are being admitted to the hospital overnight, leave your suitcase in the car. After surgery it may be brought to your room.  In case of increased patient census, it may be necessary for you, the patient, to continue your postoperative care in the Same Day Surgery department.  If you are being discharged the day of surgery, you will not be allowed to drive home. You will need a responsible individual to drive you home and stay with you for 24 hours after surgery.   If you are taking public transportation, you will need to have a responsible individual with you.  Please call the Pre-admissions Testing Dept. at [phone] if you have any questions about these instructions.  Surgery Visitation Policy:  Patients having surgery or a procedure may have two visitors.  Children under the age of 53 must have an adult with them who is not the patient.  Inpatient Visitation:    Visiting hours are 7 a.m. to 8 p.m. Up to four visitors are allowed at one time in a patient room. The visitors may rotate out with other people during the day.  One visitor age 48 or older may stay with the patient overnight and must be in the room by 8 p.m.   Pre-operative 5 CHG Bath Instructions   You can play a key role in reducing the risk of infection after surgery. Your skin needs to be as free of germs as possible. You can reduce the number of germs on your skin by washing with CHG (chlorhexidine  gluconate) soap before surgery. CHG is an antiseptic soap that kills germs and continues to kill germs even after washing.   DO NOT use if you have an allergy to chlorhexidine /CHG or antibacterial soaps. If your skin becomes reddened or irritated, stop using the CHG and notify one of our RNs at [phone].   Please shower with the CHG soap starting 4 days before surgery using the following schedule: 05/04 - 05/08.  Please keep in mind the following:  DO NOT shave, including legs  and underarms, starting the day of your first shower.   You may shave your face at any point before/day of surgery.  Place clean sheets on your bed the day you start using CHG soap. Use a clean washcloth (not used since being washed) for each shower. DO NOT sleep with pets once you start using the CHG.   CHG Shower Instructions:  If you choose to wash your hair and private area, wash first with your normal shampoo/soap.  After you use shampoo/soap, rinse your hair and body thoroughly to remove shampoo/soap residue.  Turn the water OFF and apply about 3 tablespoons (45 ml) of CHG soap to a CLEAN washcloth.  Apply CHG soap ONLY FROM YOUR NECK DOWN TO YOUR TOES (washing for 3-5 minutes)  DO NOT use CHG soap on face, private areas, open wounds, or sores.  Pay special attention to the area where your surgery is being performed.  If you are having back surgery, having someone wash your  back for you may be helpful. Wait 2 minutes after CHG soap is applied, then you may rinse off the CHG soap.  Pat dry with a clean towel  Put on clean clothes/pajamas   If you choose to wear lotion, please use ONLY the CHG-compatible lotions on the back of this paper.     Additional instructions for the day of surgery: DO NOT APPLY any lotions, deodorants, cologne, or perfumes.   Put on clean/comfortable clothes.  Brush your teeth.  Ask your nurse before applying any prescription medications to the skin.      CHG Compatible Lotions   Aveeno Moisturizing lotion  Cetaphil Moisturizing Cream  Cetaphil Moisturizing Lotion  Clairol Herbal Essence Moisturizing Lotion, Dry Skin  Clairol Herbal Essence Moisturizing Lotion, Extra Dry Skin  Clairol Herbal Essence Moisturizing Lotion, Normal Skin  Curel Age Defying Therapeutic Moisturizing Lotion with Alpha Hydroxy  Curel Extreme Care Body Lotion  Curel Soothing Hands Moisturizing Hand Lotion  Curel Therapeutic Moisturizing Cream, Fragrance-Free  Curel  Therapeutic Moisturizing Lotion, Fragrance-Free  Curel Therapeutic Moisturizing Lotion, Original Formula  Eucerin Daily Replenishing Lotion  Eucerin Dry Skin Therapy Plus Alpha Hydroxy Crme  Eucerin Dry Skin Therapy Plus Alpha Hydroxy Lotion  Eucerin Original Crme  Eucerin Original Lotion  Eucerin Plus Crme Eucerin Plus Lotion  Eucerin TriLipid Replenishing Lotion  Keri Anti-Bacterial Hand Lotion  Keri Deep Conditioning Original Lotion Dry Skin Formula Softly Scented  Keri Deep Conditioning Original Lotion, Fragrance Free Sensitive Skin Formula  Keri Lotion Fast Absorbing Fragrance Free Sensitive Skin Formula  Keri Lotion Fast Absorbing Softly Scented Dry Skin Formula  Keri Original Lotion  Keri Skin Renewal Lotion Keri Silky Smooth Lotion  Keri Silky Smooth Sensitive Skin Lotion  Nivea Body Creamy Conditioning Oil  Nivea Body Extra Enriched Teacher, adult education Moisturizing Lotion Nivea Crme  Nivea Skin Firming Lotion  NutraDerm 30 Skin Lotion  NutraDerm Skin Lotion  NutraDerm Therapeutic Skin Cream  NutraDerm Therapeutic Skin Lotion  ProShield Protective Hand Cream  Provon moisturizing lotion

## 2023-11-25 MED ORDER — ORAL CARE MOUTH RINSE: 15.0000 mL | Freq: Once | OROMUCOSAL | Status: AC

## 2023-11-25 MED ORDER — CHLORHEXIDINE GLUCONATE 0.12 % MT SOLN
15.0000 mL | Freq: Once | OROMUCOSAL | Status: AC
  Administered 2023-11-26: 15 mL via OROMUCOSAL

## 2023-11-25 MED ORDER — DEXAMETHASONE SODIUM PHOSPHATE 10 MG/ML IJ SOLN
8.0000 mg | Freq: Once | INTRAMUSCULAR | Status: AC
Start: 2023-11-25 — End: 2023-11-26
  Administered 2023-11-26: 8 mg via INTRAVENOUS

## 2023-11-25 MED ORDER — CEFAZOLIN SODIUM-DEXTROSE 2-4 GM/100ML-% IV SOLN
2.0000 g | INTRAVENOUS | Status: AC
  Administered 2023-11-26: 2 g via INTRAVENOUS

## 2023-11-25 MED ORDER — LACTATED RINGERS IV SOLN
INTRAVENOUS | Status: DC
Start: 2023-11-25 — End: 2023-11-26

## 2023-11-25 MED ORDER — TRANEXAMIC ACID-NACL 1000-0.7 MG/100ML-% IV SOLN
1000.0000 mg | INTRAVENOUS | Status: AC
  Administered 2023-11-26: 1000 mg via INTRAVENOUS

## 2023-11-26 ENCOUNTER — Ambulatory Visit: Admitting: Anesthesiology

## 2023-11-26 ENCOUNTER — Encounter
Admission: RE | Disposition: A | Discharge status: Home Health | Payer: Self-pay | Source: Ambulatory Visit | Attending: Orthopedic Surgery

## 2023-11-26 ENCOUNTER — Ambulatory Visit: Payer: Self-pay | Admitting: Urgent Care

## 2023-11-26 ENCOUNTER — Ambulatory Visit
Admission: RE | Admit: 2023-11-26 | Discharge: 2023-11-27 | Disposition: A | Discharge status: Home Health | Source: Ambulatory Visit | Attending: Orthopedic Surgery | Admitting: Orthopedic Surgery

## 2023-11-26 ENCOUNTER — Ambulatory Visit

## 2023-11-26 ENCOUNTER — Encounter: Payer: Self-pay | Admitting: Orthopedic Surgery

## 2023-11-26 ENCOUNTER — Other Ambulatory Visit: Payer: Self-pay

## 2023-11-26 DIAGNOSIS — M25851 Other specified joint disorders, right hip: Secondary | ICD-10-CM | POA: Insufficient documentation

## 2023-11-26 DIAGNOSIS — K219 Gastro-esophageal reflux disease without esophagitis: Secondary | ICD-10-CM | POA: Diagnosis not present

## 2023-11-26 DIAGNOSIS — Z96641 Presence of right artificial hip joint: Secondary | ICD-10-CM | POA: Diagnosis present

## 2023-11-26 DIAGNOSIS — M160 Bilateral primary osteoarthritis of hip: Secondary | ICD-10-CM | POA: Diagnosis not present

## 2023-11-26 DIAGNOSIS — J4530 Mild persistent asthma, uncomplicated: Secondary | ICD-10-CM | POA: Insufficient documentation

## 2023-11-26 DIAGNOSIS — Z87891 Personal history of nicotine dependence: Secondary | ICD-10-CM | POA: Diagnosis not present

## 2023-11-26 DIAGNOSIS — M1611 Unilateral primary osteoarthritis, right hip: Secondary | ICD-10-CM | POA: Diagnosis present

## 2023-11-26 DIAGNOSIS — Z01812 Encounter for preprocedural laboratory examination: Secondary | ICD-10-CM

## 2023-11-26 HISTORY — PX: TOTAL HIP ARTHROPLASTY: SHX124

## 2023-11-26 LAB — ABO/RH: ABO/RH(D): O POS

## 2023-11-26 SURGERY — ARTHROPLASTY, HIP, TOTAL, ANTERIOR APPROACH
Anesthesia: Spinal | Site: Hip | Laterality: Right

## 2023-11-26 MED ORDER — HYDROMORPHONE HCL 1 MG/ML IJ SOLN
INTRAMUSCULAR | Status: AC
  Filled 2023-11-26: qty 1

## 2023-11-26 MED ORDER — SURGIPHOR WOUND IRRIGATION SYSTEM - OPTIME: TOPICAL | Status: DC | PRN

## 2023-11-26 MED ORDER — BUPIVACAINE HCL (PF) 0.5 % IJ SOLN
INTRAMUSCULAR | Status: AC
  Filled 2023-11-26: qty 10

## 2023-11-26 MED ORDER — MUPIROCIN 2 % EX OINT: 1.0000 | TOPICAL_OINTMENT | Freq: Two times a day (BID) | CUTANEOUS | 0 refills | Status: AC

## 2023-11-26 MED ORDER — HYDROMORPHONE HCL 1 MG/ML IJ SOLN
0.5000 mg | INTRAMUSCULAR | Status: DC | PRN
  Administered 2023-11-26: 0.5 mg via INTRAVENOUS

## 2023-11-26 MED ORDER — OXYCODONE HCL 5 MG PO TABS
5.0000 mg | ORAL_TABLET | Freq: Once | ORAL | Status: AC | PRN
  Administered 2023-11-26: 5 mg via ORAL

## 2023-11-26 MED ORDER — SODIUM CHLORIDE 0.9 % IV SOLN: INTRAVENOUS | Status: DC

## 2023-11-26 MED ORDER — OXYCODONE HCL 5 MG PO TABS
10.0000 mg | ORAL_TABLET | Freq: Once | ORAL | Status: AC
  Administered 2023-11-26: 10 mg via ORAL
  Filled 2023-11-26: qty 2

## 2023-11-26 MED ORDER — MIDAZOLAM HCL 2 MG/2ML IJ SOLN
INTRAMUSCULAR | Status: AC
  Filled 2023-11-26: qty 2

## 2023-11-26 MED ORDER — 0.9 % SODIUM CHLORIDE (POUR BTL) OPTIME
TOPICAL | Status: DC | PRN
  Administered 2023-11-26: 500 mL

## 2023-11-26 MED ORDER — ENOXAPARIN SODIUM 40 MG/0.4ML IJ SOSY: 40.0000 mg | PREFILLED_SYRINGE | INTRAMUSCULAR | 0 refills | Status: DC

## 2023-11-26 MED ORDER — BREXPIPRAZOLE 0.25 MG PO TABS
0.5000 mg | ORAL_TABLET | Freq: Every day | ORAL | Status: DC
  Administered 2023-11-26: 0.5 mg via ORAL
  Filled 2023-11-26: qty 2

## 2023-11-26 MED ORDER — DOCUSATE SODIUM 100 MG PO CAPS: 100.0000 mg | ORAL_CAPSULE | Freq: Two times a day (BID) | ORAL | 0 refills | Status: DC

## 2023-11-26 MED ORDER — OXYCODONE HCL 5 MG PO TABS: 5.0000 mg | ORAL_TABLET | Freq: Four times a day (QID) | ORAL | 0 refills | Status: DC | PRN

## 2023-11-26 MED ORDER — CEFAZOLIN SODIUM-DEXTROSE 2-4 GM/100ML-% IV SOLN
INTRAVENOUS | Status: AC
  Filled 2023-11-26: qty 100

## 2023-11-26 MED ORDER — ACETAMINOPHEN 500 MG PO TABS
1000.0000 mg | ORAL_TABLET | Freq: Three times a day (TID) | ORAL | Status: DC
  Administered 2023-11-26 – 2023-11-27 (×3): 1000 mg via ORAL
  Filled 2023-11-26 (×3): qty 2

## 2023-11-26 MED ORDER — OXYCODONE HCL 5 MG PO TABS: 5.0000 mg | ORAL_TABLET | ORAL | Status: DC | PRN

## 2023-11-26 MED ORDER — ONDANSETRON HCL 4 MG PO TABS
4.0000 mg | ORAL_TABLET | Freq: Four times a day (QID) | ORAL | 0 refills | Status: DC | PRN
Start: 2023-11-26 — End: 2024-04-05

## 2023-11-26 MED ORDER — PHENYLEPHRINE HCL-NACL 20-0.9 MG/250ML-% IV SOLN
INTRAVENOUS | Status: DC | PRN
  Administered 2023-11-26: 25 ug/min via INTRAVENOUS

## 2023-11-26 MED ORDER — METOCLOPRAMIDE HCL 5 MG PO TABS: 5.0000 mg | ORAL_TABLET | Freq: Three times a day (TID) | ORAL | Status: DC | PRN

## 2023-11-26 MED ORDER — CHLORHEXIDINE GLUCONATE 4 % EX SOLN: 1.0000 | CUTANEOUS | 1 refills | Status: DC

## 2023-11-26 MED ORDER — PROPOFOL 1000 MG/100ML IV EMUL
INTRAVENOUS | Status: AC
Start: 2023-11-26 — End: ?
  Filled 2023-11-26: qty 100

## 2023-11-26 MED ORDER — TRANEXAMIC ACID-NACL 1000-0.7 MG/100ML-% IV SOLN
INTRAVENOUS | Status: AC
  Filled 2023-11-26: qty 100

## 2023-11-26 MED ORDER — CHLORHEXIDINE GLUCONATE 0.12 % MT SOLN
OROMUCOSAL | Status: AC
  Filled 2023-11-26: qty 15

## 2023-11-26 MED ORDER — DEXAMETHASONE SODIUM PHOSPHATE 10 MG/ML IJ SOLN
INTRAMUSCULAR | Status: AC
  Filled 2023-11-26: qty 1

## 2023-11-26 MED ORDER — ACETAMINOPHEN 500 MG PO TABS
ORAL_TABLET | ORAL | Status: AC
  Filled 2023-11-26: qty 2

## 2023-11-26 MED ORDER — ONDANSETRON HCL 4 MG PO TABS: 4.0000 mg | ORAL_TABLET | Freq: Four times a day (QID) | ORAL | Status: DC | PRN

## 2023-11-26 MED ORDER — DIAZEPAM 2 MG PO TABS
2.0000 mg | ORAL_TABLET | Freq: Two times a day (BID) | ORAL | Status: DC
  Administered 2023-11-26 – 2023-11-27 (×2): 2 mg via ORAL
  Filled 2023-11-26 (×2): qty 1

## 2023-11-26 MED ORDER — ONDANSETRON HCL 4 MG/2ML IJ SOLN: 4.0000 mg | Freq: Four times a day (QID) | INTRAMUSCULAR | Status: DC | PRN

## 2023-11-26 MED ORDER — OXYCODONE HCL 5 MG PO TABS
ORAL_TABLET | ORAL | Status: AC
  Filled 2023-11-26: qty 1

## 2023-11-26 MED ORDER — FENTANYL CITRATE (PF) 100 MCG/2ML IJ SOLN
INTRAMUSCULAR | Status: AC
  Filled 2023-11-26: qty 2

## 2023-11-26 MED ORDER — PANTOPRAZOLE SODIUM 40 MG PO TBEC
40.0000 mg | DELAYED_RELEASE_TABLET | Freq: Every day | ORAL | Status: DC
  Administered 2023-11-26 – 2023-11-27 (×2): 40 mg via ORAL
  Filled 2023-11-26 (×2): qty 1

## 2023-11-26 MED ORDER — BUPIVACAINE HCL (PF) 0.5 % IJ SOLN
INTRAMUSCULAR | Status: DC | PRN
  Administered 2023-11-26: 2.3 mL

## 2023-11-26 MED ORDER — METOCLOPRAMIDE HCL 5 MG/ML IJ SOLN: 5.0000 mg | Freq: Three times a day (TID) | INTRAMUSCULAR | Status: DC | PRN

## 2023-11-26 MED ORDER — SODIUM CHLORIDE 0.9 % IR SOLN
Status: DC | PRN
  Administered 2023-11-26: 100 mL

## 2023-11-26 MED ORDER — PROPOFOL 500 MG/50ML IV EMUL
INTRAVENOUS | Status: DC | PRN
  Administered 2023-11-26: 80 ug/kg/min via INTRAVENOUS

## 2023-11-26 MED ORDER — LIDOCAINE HCL (CARDIAC) PF 100 MG/5ML IV SOSY
PREFILLED_SYRINGE | INTRAVENOUS | Status: DC | PRN
Start: 2023-11-26 — End: 2023-11-26
  Administered 2023-11-26: 30 mg via INTRAVENOUS

## 2023-11-26 MED ORDER — MORPHINE SULFATE (PF) 2 MG/ML IV SOLN
0.5000 mg | INTRAVENOUS | Status: DC | PRN
  Administered 2023-11-26 – 2023-11-27 (×4): 1 mg via INTRAVENOUS
  Filled 2023-11-26 (×4): qty 1

## 2023-11-26 MED ORDER — OXYCODONE HCL 5 MG PO TABS
7.5000 mg | ORAL_TABLET | ORAL | Status: DC | PRN
  Administered 2023-11-26: 7.5 mg via ORAL
  Filled 2023-11-26: qty 2

## 2023-11-26 MED ORDER — SODIUM CHLORIDE (PF) 0.9 % IJ SOLN
INTRAMUSCULAR | Status: DC | PRN
  Administered 2023-11-26: 50 mL

## 2023-11-26 MED ORDER — ACETAMINOPHEN 325 MG PO TABS: 325.0000 mg | ORAL_TABLET | Freq: Four times a day (QID) | ORAL | Status: DC | PRN

## 2023-11-26 MED ORDER — OXYCODONE HCL 5 MG/5ML PO SOLN: 5.0000 mg | Freq: Once | ORAL | Status: AC | PRN

## 2023-11-26 MED ORDER — CEFAZOLIN SODIUM-DEXTROSE 2-4 GM/100ML-% IV SOLN
2.0000 g | Freq: Four times a day (QID) | INTRAVENOUS | Status: AC
  Administered 2023-11-26 (×2): 2 g via INTRAVENOUS
  Filled 2023-11-26 (×2): qty 100

## 2023-11-26 MED ORDER — OXYCODONE HCL ER 10 MG PO T12A
10.0000 mg | EXTENDED_RELEASE_TABLET | Freq: Once | ORAL | Status: AC
  Administered 2023-11-26: 10 mg via ORAL
  Filled 2023-11-26: qty 1

## 2023-11-26 MED ORDER — OXYCODONE HCL 5 MG PO TABS
10.0000 mg | ORAL_TABLET | ORAL | Status: DC | PRN
  Administered 2023-11-26 – 2023-11-27 (×3): 10 mg via ORAL
  Filled 2023-11-26 (×3): qty 2

## 2023-11-26 MED ORDER — MIDAZOLAM HCL 5 MG/5ML IJ SOLN
INTRAMUSCULAR | Status: DC | PRN
Start: 2023-11-26 — End: 2023-11-26
  Administered 2023-11-26: 2 mg via INTRAVENOUS

## 2023-11-26 MED ORDER — FENTANYL CITRATE (PF) 100 MCG/2ML IJ SOLN
INTRAMUSCULAR | Status: DC | PRN
  Administered 2023-11-26 (×2): 50 ug via INTRAVENOUS

## 2023-11-26 MED ORDER — HYDROMORPHONE HCL 1 MG/ML IJ SOLN
0.5000 mg | Freq: Once | INTRAMUSCULAR | Status: AC
  Administered 2023-11-26: 0.5 mg via INTRAVENOUS
  Filled 2023-11-26: qty 0.5

## 2023-11-26 MED ORDER — FENTANYL CITRATE (PF) 100 MCG/2ML IJ SOLN
25.0000 ug | INTRAMUSCULAR | Status: AC | PRN
  Administered 2023-11-26 (×6): 25 ug via INTRAVENOUS

## 2023-11-26 MED ORDER — MENTHOL 3 MG MT LOZG: 1.0000 | LOZENGE | OROMUCOSAL | Status: DC | PRN

## 2023-11-26 MED ORDER — VORTIOXETINE HBR 5 MG PO TABS
20.0000 mg | ORAL_TABLET | Freq: Every day | ORAL | Status: DC
  Administered 2023-11-26: 20 mg via ORAL
  Filled 2023-11-26: qty 4

## 2023-11-26 MED ORDER — ONDANSETRON HCL 4 MG/2ML IJ SOLN
INTRAMUSCULAR | Status: AC
  Filled 2023-11-26: qty 2

## 2023-11-26 MED ORDER — PROPOFOL 10 MG/ML IV BOLUS
INTRAVENOUS | Status: DC | PRN
  Administered 2023-11-26: 20 mg via INTRAVENOUS

## 2023-11-26 MED ORDER — PHENOL 1.4 % MT LIQD: 1.0000 | OROMUCOSAL | Status: DC | PRN

## 2023-11-26 MED ORDER — DOCUSATE SODIUM 100 MG PO CAPS
100.0000 mg | ORAL_CAPSULE | Freq: Two times a day (BID) | ORAL | Status: DC
  Administered 2023-11-26 – 2023-11-27 (×2): 100 mg via ORAL
  Filled 2023-11-26 (×2): qty 1

## 2023-11-26 MED ORDER — ONDANSETRON HCL 4 MG/2ML IJ SOLN
INTRAMUSCULAR | Status: DC | PRN
  Administered 2023-11-26: 4 mg via INTRAVENOUS

## 2023-11-26 MED ORDER — ENOXAPARIN SODIUM 40 MG/0.4ML IJ SOSY
40.0000 mg | PREFILLED_SYRINGE | INTRAMUSCULAR | Status: DC
  Administered 2023-11-27: 40 mg via SUBCUTANEOUS
  Filled 2023-11-26: qty 0.4

## 2023-11-26 SURGICAL SUPPLY — 60 items
BLADE CLIPPER SURG (BLADE) IMPLANT
BLADE SAGITTAL AGGR TOOTH XLG (BLADE) ×1 IMPLANT
BNDG COHESIVE 6X5 TAN ST LF (GAUZE/BANDAGES/DRESSINGS) ×2 IMPLANT
BRUSH SCRUB EZ PLAIN DRY (MISCELLANEOUS) ×1 IMPLANT
CHLORAPREP W/TINT 26 (MISCELLANEOUS) ×1 IMPLANT
DERMABOND ADVANCED .7 DNX12 (GAUZE/BANDAGES/DRESSINGS) ×1 IMPLANT
DRAPE C-ARM XRAY 36X54 (DRAPES) ×1 IMPLANT
DRAPE SHEET LG 3/4 BI-LAMINATE (DRAPES) ×3 IMPLANT
DRAPE TABLE BACK 80X90 (DRAPES) ×1 IMPLANT
DRSG MEPILEX SACRM 8.7X9.8 (GAUZE/BANDAGES/DRESSINGS) ×1 IMPLANT
DRSG OPSITE POSTOP 4X8 (GAUZE/BANDAGES/DRESSINGS) ×1 IMPLANT
ELECTRODE BLDE 4.0 EZ CLN MEGD (MISCELLANEOUS) ×1 IMPLANT
ELECTRODE REM PT RTRN 9FT ADLT (ELECTROSURGICAL) ×1 IMPLANT
GLOVE BIO SURGEON STRL SZ8 (GLOVE) ×1 IMPLANT
GLOVE BIOGEL PI IND STRL 8 (GLOVE) ×1 IMPLANT
GLOVE PI ORTHO PRO STRL 7.5 (GLOVE) ×2 IMPLANT
GLOVE PI ORTHO PRO STRL SZ8 (GLOVE) ×2 IMPLANT
GLOVE SURG SYN 7.5 E (GLOVE) ×1 IMPLANT
GLOVE SURG SYN 7.5 PF PI (GLOVE) ×1 IMPLANT
GOWN SRG XL LVL 3 NONREINFORCE (GOWNS) ×1 IMPLANT
GOWN STRL REUS W/ TWL LRG LVL3 (GOWN DISPOSABLE) ×1 IMPLANT
GOWN STRL REUS W/ TWL XL LVL3 (GOWN DISPOSABLE) ×1 IMPLANT
HEAD FEM -4XOFST TPR 32X (Joint) IMPLANT
HOOD PEEL AWAY T7 (MISCELLANEOUS) ×2 IMPLANT
INSERT ANG TRIDENT C 32 0D (Insert) IMPLANT
IV NS 100ML SINGLE PACK (IV SOLUTION) ×1 IMPLANT
KIT PATIENT CARE HANA TABLE (KITS) ×1 IMPLANT
KIT TURNOVER CYSTO (KITS) ×1 IMPLANT
LIGHT WAVEGUIDE WIDE FLAT (MISCELLANEOUS) ×1 IMPLANT
MANIFOLD NEPTUNE II (INSTRUMENTS) ×1 IMPLANT
MARKER SKIN DUAL TIP RULER LAB (MISCELLANEOUS) ×1 IMPLANT
MAT ABSORB FLUID 56X50 GRAY (MISCELLANEOUS) ×1 IMPLANT
NDL SPNL 20GX3.5 QUINCKE YW (NEEDLE) ×1 IMPLANT
NEEDLE SPNL 20GX3.5 QUINCKE YW (NEEDLE) ×1 IMPLANT
NS IRRIG 500ML POUR BTL (IV SOLUTION) ×1 IMPLANT
PACK HIP COMPR (MISCELLANEOUS) ×1 IMPLANT
PAD ARMBOARD POSITIONER FOAM (MISCELLANEOUS) ×1 IMPLANT
PENCIL SMOKE EVACUATOR (MISCELLANEOUS) ×1 IMPLANT
SCREW HEX LP 6.5X15 (Screw) IMPLANT
SCREW HEX LP 6.5X20 (Screw) IMPLANT
SHELL ACETAB TRI HIP C 46 (Shell) IMPLANT
SLEEVE SCD COMPRESS KNEE MED (STOCKING) ×1 IMPLANT
SOLUTION IRRIG SURGIPHOR (IV SOLUTION) ×1 IMPLANT
STEM FEM INSIG HIP 0 HI OFF (Stem) IMPLANT
SURGIFLO W/THROMBIN 8M KIT (HEMOSTASIS) IMPLANT
SUT BONE WAX W31G (SUTURE) ×1 IMPLANT
SUT ETHIBOND 2 V 37 (SUTURE) ×1 IMPLANT
SUT SILK 0 30XBRD TIE 6 (SUTURE) ×1 IMPLANT
SUT STRATAFIX 14 PDO 36 VLT (SUTURE) ×1 IMPLANT
SUT STRATAFIX PDO 1 14 VIOLET (SUTURE) ×1 IMPLANT
SUT VIC AB 0 CT1 36 (SUTURE) ×1 IMPLANT
SUT VIC AB 2-0 CT2 27 (SUTURE) ×1 IMPLANT
SUTURE STRATA 1 CT-1 DLB (SUTURE) ×1 IMPLANT
SUTURE STRATA SPIR 4-0 18 (SUTURE) ×1 IMPLANT
SYR 20ML LL LF (SYRINGE) ×2 IMPLANT
TAPE MICROFOAM 4IN (TAPE) IMPLANT
TOWEL OR 17X26 4PK STRL BLUE (TOWEL DISPOSABLE) IMPLANT
TRAP FLUID SMOKE EVACUATOR (MISCELLANEOUS) ×1 IMPLANT
WAND WEREWOLF FASTSEAL 6.0 (MISCELLANEOUS) ×1 IMPLANT
WATER STERILE IRR 1000ML POUR (IV SOLUTION) ×1 IMPLANT

## 2023-11-26 NOTE — Anesthesia Preprocedure Evaluation (Addendum)
 Anesthesia Evaluation  Patient identified by MRN, date of birth, ID band Patient awake    Reviewed: Allergy & Precautions, NPO status , Patient's Chart, lab work & pertinent test results  History of Anesthesia Complications Negative for: history of anesthetic complications  Airway Mallampati: III  TM Distance: >3 FB Neck ROM: full    Dental  (+) Implants   Pulmonary asthma , former smoker   Pulmonary exam normal        Cardiovascular negative cardio ROS Normal cardiovascular exam     Neuro/Psych  PSYCHIATRIC DISORDERS Anxiety Depression    negative neurological ROS     GI/Hepatic Neg liver ROS,GERD  Medicated,,  Endo/Other  negative endocrine ROS    Renal/GU      Musculoskeletal   Abdominal   Peds  Hematology negative hematology ROS (+)   Anesthesia Other Findings Past Medical History: No date: Allergy No date: Anxiety No date: Asthma No date: GERD (gastroesophageal reflux disease) No date: Internal hemorrhoids No date: Pneumonia No date: Primary osteoarthritis of right hip  Past Surgical History: 2010: CARPAL TUNNEL RELEASE; Bilateral 2009: CESAREAN SECTION No date: COLONOSCOPY 09/03/2015: EPIGASTRIC HERNIA REPAIR; N/A     Comment:  Procedure: HERNIA REPAIR EPIGASTRIC ADULT;  Surgeon:               Marshall Skeeter, MD;  Location: [facility] ORS;  Service:               General;  Laterality: N/A; 2013: HIP SURGERY 2002: OVARIAN CYST SURGERY     Comment:  [facility] [place] 2010: TONSILLECTOMY     Reproductive/Obstetrics negative OB ROS                             Anesthesia Physical Anesthesia Plan  ASA: 2  Anesthesia Plan: Spinal   Post-op Pain Management: Regional block*, Ofirmev  IV (intra-op)* and Toradol IV (intra-op)*   Induction: Intravenous  PONV Risk Score and Plan: 2 and Propofol  infusion and TIVA  Airway Management Planned: Natural Airway and Nasal  Cannula  Additional Equipment:   Intra-op Plan:   Post-operative Plan:   Informed Consent: I have reviewed the patients History and Physical, chart, labs and discussed the procedure including the risks, benefits and alternatives for the proposed anesthesia with the patient or authorized representative who has indicated his/her understanding and acceptance.     Dental Advisory Given  Plan Discussed with: Anesthesiologist, CRNA and Surgeon  Anesthesia Plan Comments: (Patient reports no bleeding problems and no anticoagulant use.  Plan for spinal with backup GA  Patient consented for risks of anesthesia including but not limited to:  - adverse reactions to medications - damage to eyes, teeth, lips or other oral mucosa - nerve damage due to positioning  - risk of bleeding, infection and or nerve damage from spinal that could lead to paralysis - risk of headache or failed spinal - damage to teeth, lips or other oral mucosa - sore throat or hoarseness - damage to heart, brain, nerves, lungs, other parts of body or loss of life  Patient voiced understanding and assent. Patient unable to void for UPT, discussed risks of anesthesia in pregnancy including but not limited to miscarriage, preterm labor, developmental defects. Patient voiced understanding and wishes to proceed.)        Anesthesia Quick Evaluation

## 2023-11-26 NOTE — Anesthesia Procedure Notes (Signed)
 Spinal  Patient location during procedure: OR Start time: 11/26/2023 10:20 AM End time: 11/26/2023 10:20 AM Reason for block: surgical anesthesia Staffing Performed: resident/CRNA  Resident/CRNA: Bill Budd, CRNA Performed by: Bill Budd, CRNA Authorized by: Nancey Awkward, MD   Preanesthetic Checklist Completed: patient identified, IV checked, site marked, risks and benefits discussed, surgical consent, monitors and equipment checked, pre-op evaluation and timeout performed Spinal Block Patient position: sitting Prep: DuraPrep Patient monitoring: heart rate, cardiac monitor, continuous pulse ox and blood pressure Approach: midline Location: L3-4 Injection technique: single-shot Needle Needle type: Pencan  Needle gauge: 25 G Needle length: 10 cm Assessment Sensory level: T4 Events: CSF return

## 2023-11-26 NOTE — Evaluation (Signed)
 Physical Therapy Evaluation Patient Details Name: Karen [name] MRN: [id number] DOB: 1981-04-12 Today's Date: 11/26/2023  History of Present Illness  Pt is s/p R THA on 11/26/23.  Clinical Impression  Pt admitted with above diagnosis. Pt currently with functional limitations due to the deficits listed below (see PT Problem List). Pt received upright in bed initially declining PT but then agreeable with encouragement. Reports PTA being fully independent prior to surgery.   To date, reviewed HEP, WB status, and anterior hip precautions. Pt is moving excellent performing bed mobility and STS to RW independently but needs VC's for hand placement. SPT to <> from recliner at supervision level. Unable to tolerate sitting in recliner due to worsening R hip pain. Encouraged return to recliner for meals and once pain more controlled. Pt understanding. Anticipate gait and stairs training pt will be appropriate for d/c recs to address acute deficits in weakness/pain. Pt with all needs in reach.      If plan is discharge home, recommend the following: A little help with walking and/or transfers;Help with stairs or ramp for entrance;Assist for transportation   Can travel by private vehicle        Equipment Recommendations Rolling walker (2 wheels) (youth walker)  Recommendations for Other Services       Functional Status Assessment Patient has had a recent decline in their functional status and demonstrates the ability to make significant improvements in function in a reasonable and predictable amount of time.     Precautions / Restrictions Precautions Precautions: Anterior Hip Precaution Booklet Issued: Yes (comment) Recall of Precautions/Restrictions: Intact Restrictions Weight Bearing Restrictions Per Provider Order: Yes RLE Weight Bearing Per Provider Order: Weight bearing as tolerated      Mobility  Bed Mobility Overal bed mobility: Modified Independent               Patient  Response: Cooperative  Transfers Overall transfer level: Modified independent Equipment used: Rolling walker (2 wheels)               General transfer comment: VC's for hand placement    Ambulation/Gait                  Stairs            Wheelchair Mobility     Tilt Bed Tilt Bed Patient Response: Cooperative  Modified Rankin (Stroke Patients Only)       Balance Overall balance assessment: Mild deficits observed, not formally tested                                           Pertinent Vitals/Pain Pain Assessment Pain Assessment: 0-10 Pain Score: 7  Pain Location: R incision of hip Pain Descriptors / Indicators: Discomfort, Guarding, Grimacing Pain Intervention(s): Limited activity within patient's tolerance, Monitored during session, Repositioned    Home Living Family/patient expects to be discharged to:: Private residence Living Arrangements: Spouse/significant other;Children Available Help at Discharge: Family;Available 24 hours/day Type of Home: House Home Access: Stairs to enter Entrance Stairs-Rails: None Entrance Stairs-Number of Steps: 2   Home Layout: One level Home Equipment: None      Prior Function Prior Level of Function : Independent/Modified Independent                     Extremity/Trunk Assessment   Upper Extremity Assessment Upper Extremity Assessment: Overall WFL for tasks  assessed    Lower Extremity Assessment Lower Extremity Assessment: Overall WFL for tasks assessed;RLE deficits/detail RLE Deficits / Details: expected, acute pain       Communication   Communication Communication: No apparent difficulties    Cognition Arousal: Alert Behavior During Therapy: WFL for tasks assessed/performed   PT - Cognitive impairments: No apparent impairments                         Following commands: Intact       Cueing Cueing Techniques: Verbal cues     General Comments       Exercises Total Joint Exercises Ankle Circles/Pumps: AROM, Strengthening, Both, 5 reps, Supine Quad Sets: AROM, Strengthening, Right, 5 reps, Supine Gluteal Sets: AROM, Strengthening, Both, 10 reps, Supine Hip ABduction/ADduction: AROM, Strengthening, Right, 10 reps, Supine Other Exercises Other Exercises: Role of PT in acute setting, WB status, anterior hip precautions, HEP (reps/sets/frequency)   Assessment/Plan    PT Assessment Patient needs continued PT services  PT Problem List Decreased strength;Decreased mobility;Decreased activity tolerance       PT Treatment Interventions DME instruction;Therapeutic exercise;Gait training;Balance training;Stair training;Neuromuscular re-education;Functional mobility training;Therapeutic activities;Patient/family education    PT Goals (Current goals can be found in the Care Plan section)  Acute Rehab PT Goals Patient Stated Goal: to improve pain and go home PT Goal Formulation: With patient Time For Goal Achievement: 12/10/23 Potential to Achieve Goals: Good    Frequency BID     Co-evaluation               AM-PAC PT "6 Clicks" Mobility  Outcome Measure Help needed turning from your back to your side while in a flat bed without using bedrails?: None Help needed moving from lying on your back to sitting on the side of a flat bed without using bedrails?: None Help needed moving to and from a bed to a chair (including a wheelchair)?: None Help needed standing up from a chair using your arms (e.g., wheelchair or bedside chair)?: A Little Help needed to walk in hospital room?: A Little Help needed climbing 3-5 steps with a railing? : A Little 6 Click Score: 21    End of Session   Activity Tolerance: Patient tolerated treatment well Patient left: in bed;with call bell/phone within reach;with bed alarm set;with family/visitor present Nurse Communication: Mobility status PT Visit Diagnosis: Difficulty in walking, not elsewhere  classified (R26.2);Pain Pain - Right/Left: Right Pain - part of body: Hip    Time: 9604-5409 PT Time Calculation (min) (ACUTE ONLY): 11 min   Charges:   PT Evaluation $PT Eval Low Complexity: 1 Low   PT General Charges $$ ACUTE PT VISIT: 1 Visit         Marc Senior. Fairly IV, PT, DPT Physical Therapist- [place]  [facility]  11/26/2023, 4:07 PM

## 2023-11-26 NOTE — Discharge Summary (Signed)
 Physician Discharge Summary  Patient ID: Karen [name] MRN: [id number] DOB/AGE: April 27, 1981 43 y.o.  Admit date: 11/26/2023 Discharge date: 11/27/23  Admission Diagnoses:  Primary osteoarthritis of right hip [M16.11] S/P total right hip arthroplasty [Z96.641]   Discharge Diagnoses: Patient Active Problem List   Diagnosis Date Noted   S/P total right hip arthroplasty 11/26/2023   PTSD (post-traumatic stress disorder) 12/20/2022   Generalized anxiety disorder 10/25/2020   Mild persistent asthma 06/27/2016   Epigastric hernia 11/22/2012   Chronic, continuous use of opioids 11/22/2012   At risk for abuse of opiates 11/22/2012   Lipoma of abdominal wall 11/22/2012   Umbilical hernia without obstruction or gangrene 11/22/2012   Arthralgia of hip 06/10/2012   Chronic pain 04/16/2012   Depressive disorder 04/16/2012   Anxiety state 04/16/2012   Tension type headache 04/16/2012    Past Medical History:  Diagnosis Date   Allergy    Anxiety    Asthma    GERD (gastroesophageal reflux disease)    Internal hemorrhoids    Pneumonia    Primary osteoarthritis of right hip      Transfusion: none   Consultants (if any):   Discharged Condition: Improved  Hospital Course: Karen [name] is an 43 y.o. adult who was admitted 11/26/2023 with a diagnosis of S/P total right hip arthroplasty and went to the operating room on 11/26/2023 and underwent the above named procedures.    Surgeries: Procedure(s): ARTHROPLASTY, HIP, TOTAL, ANTERIOR APPROACH on 11/26/2023 Patient tolerated the surgery well. Taken to PACU where she was stabilized and then transferred to the orthopedic floor.  Started on Lovenox  40 mg q 24 hrs. TEDs and SCDs applied bilaterally. Heels elevated on bed. No evidence of DVT. Negative Homan. Physical therapy started on day #1 for gait training and transfer. OT started day #1 for ADL and assisted devices.  Patient's IV was d/c on day #1. Patient was able to safely and  independently complete all PT goals. PT recommending discharge to home.    On post op day #1 patient was stable and ready for discharge to home with HHPT.  Implants: Cup: Trident Tritanium Clusterhole 46/C w/ x2 screws    Liner: Neutral X3 Poly 32/C  Stem: Insignia #0  Head: Biolox Ceramic 32 -4mm     She was given perioperative antibiotics:  Anti-infectives (From admission, onward)    Start     Dose/Rate Route Frequency Ordered Stop   11/26/23 1630  ceFAZolin  (ANCEF ) IVPB 2g/100 mL premix        2 g 200 mL/hr over 30 Minutes Intravenous Every 6 hours 11/26/23 1539 11/26/23 2324   11/26/23 0600  ceFAZolin  (ANCEF ) IVPB 2g/100 mL premix        2 g 200 mL/hr over 30 Minutes Intravenous On call to O.R. 11/25/23 2148 11/26/23 1032     .  She was given sequential compression devices, early ambulation, and Lovenox  TEDs for DVT prophylaxis.  She benefited maximally from the hospital stay and there were no complications.    Recent vital signs:  Vitals:   11/27/23 0416 11/27/23 0457  BP: (!) 140/75 (!) 140/75  Pulse: 63 63  Resp: 15 18  Temp: (!) 97.5 F (36.4 C) (!) 97.5 F (36.4 C)  SpO2: 97%     Recent laboratory studies:  Lab Results  Component Value Date   HGB 11.2 (L) 11/20/2023   HGB 10.8 (L) 10/14/2022   HGB 11.4 (L) 06/20/2016   Lab Results  Component Value Date  WBC 5.9 11/20/2023   PLT 305 11/20/2023   No results found for: "INR" Lab Results  Component Value Date   NA 138 11/20/2023   K 3.7 11/20/2023   CL 103 11/20/2023   CO2 25 11/20/2023   BUN 10 11/20/2023   CREATININE 1.06 (H) 11/20/2023   GLUCOSE 83 11/20/2023    Discharge Medications:   Allergies as of 11/27/2023       Reactions   Benadryl [diphenhydramine Hcl] Other (See Comments)   Leg jumpy   Cyclobenzaprine Other (See Comments)    "legs jumpy" Restless legs   Ketorolac Hives   Motrin [ibuprofen] Hives   Ketorolac Tromethamine Rash   Tramadol Rash, Hives        Medication List      TAKE these medications    acetaminophen  500 MG tablet Commonly known as: TYLENOL  Take 1,000 mg by mouth every 6 (six) hours as needed.   Breztri Aerosphere 160-9-4.8 MCG/ACT Aero inhaler Generic drug: budeson-glycopyrrolate-formoterol  Inhale 2 puffs into the lungs 2 (two) times daily as needed (respiratory issues.).   chlorhexidine  4 % external liquid Commonly known as: HIBICLENS  Apply 15 mLs (1 Application total) topically as directed for 30 doses. Use as directed daily for 5 days every other week for 6 weeks.   diazepam  2 MG tablet Commonly known as: VALIUM  Take 1 tablet (2 mg total) by mouth 2 (two) times daily.   docusate sodium  100 MG capsule Commonly known as: COLACE Take 1 capsule (100 mg total) by mouth 2 (two) times daily.   enoxaparin  40 MG/0.4ML injection Commonly known as: LOVENOX  Inject 0.4 mLs (40 mg total) into the skin daily for 14 days.   medroxyPROGESTERone  Acetate 150 MG/ML Susy Inject 1 mL (150 mg total) into the muscle every 3 (three) months.   methocarbamol 500 MG tablet Commonly known as: ROBAXIN Take 1 tablet (500 mg total) by mouth 4 (four) times daily.   mupirocin  ointment 2 % Commonly known as: BACTROBAN  Place 1 Application into the nose 2 (two) times daily for 60 doses. Use as directed 2 times daily for 5 days every other week for 6 weeks.   ondansetron  4 MG disintegrating tablet Commonly known as: ZOFRAN -ODT Take 1 tablet (4 mg total) by mouth every 8 (eight) hours as needed for nausea or vomiting.   ondansetron  4 MG tablet Commonly known as: ZOFRAN  Take 1 tablet (4 mg total) by mouth every 6 (six) hours as needed for nausea.   OVER THE COUNTER MEDICATION as needed. THC gummies   Oxycodone  HCl 10 MG Tabs Take 0.5-1 tablets (5-10 mg total) by mouth every 4 (four) hours as needed for severe pain (pain score 7-10). What changed:  how much to take when to take this reasons to take this   Rexulti  0.5 MG Tabs Generic drug:  Brexpiprazole  TAKE 1 TABLET BY MOUTH EVERY DAY What changed: when to take this   vortioxetine  HBr 20 MG Tabs tablet Commonly known as: Trintellix  Take 1 tablet (20 mg total) by mouth daily. What changed: when to take this        Diagnostic Studies: DG HIP UNILAT WITH PELVIS 2-3 VIEWS RIGHT Result Date: 11/26/2023 CLINICAL DATA:  Elective surgery. EXAM: DG HIP (WITH OR WITHOUT PELVIS) 2-3V RIGHT COMPARISON:  None Available. FINDINGS: Three fluoroscopic spot views of the pelvis and right hip obtained in the operating room. Images during hip arthroplasty. Fluoroscopy time 16 seconds. Dose 2.1109 mGy. IMPRESSION: Intraoperative fluoroscopy during right hip arthroplasty. Electronically Signed  By: Chadwick Colonel M.D.   On: 11/26/2023 13:23   DG C-Arm 1-60 Min-No Report Result Date: 11/26/2023 Fluoroscopy was utilized by the requesting physician.  No radiographic interpretation.   DG C-Arm 1-60 Min-No Report Result Date: 11/26/2023 Fluoroscopy was utilized by the requesting physician.  No radiographic interpretation.    Disposition:      Follow-up Information     Coralyn Derry, PA-C Follow up in 2 week(s).   Specialties: Orthopedic Surgery, Emergency Medicine Contact information: [address] [place] [address] [phone]                  Signed: Coralyn Derry 11/27/2023, 8:05 AM

## 2023-11-26 NOTE — Op Note (Signed)
 Patient Name: Karen [name]  ZOX:[id number]  Pre-Operative Diagnosis: Right hip Osteoarthritis, right hip impingement  Post-Operative Diagnosis: (same)  Procedure: Right Total Hip Arthroplasty  Components/Implants: Cup: Trident Tritanium Clusterhole 46/C w/ x2 screws    Liner: Neutral X3 Poly 32/C  Stem: Insignia #0  Head: Biolox Ceramic 32 -4mm   Date of Surgery: 11/26/2023  Surgeon: Venus Ginsberg MD  Assistant: Francenia Ingle PA (present and scrubbed throughout the case, critical for assistance with exposure, retraction, instrumentation, and closure)   Anesthesiologist: Joline Ned  Anesthesia: Spinal   EBL: 150cc  IVF:400cc  Complications: None   Brief history: The patient is a 43 year old female with a history of osteoarthritis with hip impingement of the right hip with pain limiting their range of motion and activities of daily living, which has failed multiple attempts at conservative therapy.  The risks and benefits of total hip arthroplasty as definitive surgical treatment were discussed with the patient, who opted to proceed with the operation.  After outpatient medical clearance and optimization was completed the patient was admitted to [facility] for the procedure.  All preoperative films were reviewed and an appropriate surgical plan was made prior to surgery.   Description of procedure: The patient was brought to the operating room where laterality was confirmed by all those present to be the right side.  The patient was administered spinal anesthesia on a stretcher prior to being moved supine on the operating room table. Patient was given an intravenous dose of antibiotics for surgical prophylaxis and TXA.  All bony prominences and extremities were well padded and the patient was securely attached to the table boots, a perineal post was placed and the patient had a safety strap placed.  Surgical site was prepped with alcohol and chlorhexidine . The surgical  site over the hip was and draped in typical sterile fashion with multiple layers of adhesive and nonadhesive drapes.  The incision site was marked out with a sterile marker and care was taken to assess the position of the ASIS and ensure appropriate position for the incision.    A surgical timeout was then called with participation of all staff in the room the patient was then a confirmed again and laterality confirmed.  Incision was made over the anterior lateral aspect of the proximal thigh in line with the TFL.  Appropriate retractors were placed and all bleeding vessels were coagulated within the subcutaneous and fatty layers.  An incision was made in the TFL fascia in the interval was carefully identified.  The lateral ascending branches of the circumflex vessels were identified, cauterized and carefully dissected. The main vessels were then tied with a 0 silk hand tie.  Retractors were placed around the superior lateral and inferior medial aspects of the femoral neck and a capsulotomy was performed exposing the hip joint.  Retraction stitches were placed and the capsulotomy to assist with visualization. The labrum was found the be torn at the superolateral corner with a large bony overgrowth of the acetabulum anteriorly with a cam deformity and cartilage loss along the lateral femoral head with osteophytes forming. The decision was made to continue with total hip arthroplasty. Femoral neck cut was then made and the femoral head was extracted after placing the leg in traction.  Bone wax was then applied to the proximal cut surface of the femur and aqua mantis was used to address any bleeding around the femoral neck cut.  Retractors were then placed around the acetabulum to fully visualize the joint  space, and the remaining labral tissue was removed and pulvinar was removed.   The acetabulum was then sequentially reamed up to the appropriate size in order to get good fit and fill for the acetabular  component while under fluoroscopic guidance.  Acetabular component was then placed and malleted into a secure fit while confirming position and abduction angle and anteversion utilizing fluoroscopy.  2 screws were then placed in the acetabular cup to assist in securing the cup in place. The cup was irrigated,  a real neutral liner was placed, impacted, and checked for stability. The femur traction was dropped and sequentially externally rotated while performing a release of the posterior and superomedial tissues off of the proximal femur to allow for mobility, care was taken to preserve the external rotators and piriformis attachments.  The remaining interval between the abductors and the capsule was dissected out and a retractor was placed over the superolateral aspect of the femur over the greater trochanter.  The leg was carefully brought down into extension and adducted to provide visualization of the proximal femur for broaching.  The femur was then sequentially broached up to an appropriate size which provided for good fill and stability to the femoral broach.  A trial neck and head were placed on the femoral broach and the leg was brought up for reduction.  The hip was reduced and manual check of stability was performed.  The hip was found to be stable in flexion internal rotation and extension external rotation.  Leg lengths were confirmed on fluoroscopy.   The hip was then dislocated the trial neck and head were removed.  The leg was then brought down into extension and adduction in the proximal femur was reexposed.  The broach trial was removed and the femur was irrigated with normal saline prior to the real femoral stem being implanted.  After the femoral stem was seated and shown to have good fit and fill the appropriate head was impacted the leg was brought up and reduced.  There was good range of motion with stability in flexion internal rotation and extension external rotation on testing.  Leg  lengths were found to be appropriate on fluoroscopic evaluation at this time.  The hip was then irrigated with betdine based surgiphor solution and then saline solution.  The capsulotomy was repaired with Ethibond sutures.  A pericapsular and peritrochanteric cocktail with Exparel  and bupivacaine  was then injected as well as the subcutaneous tissues. The fascia was closed with a #1 barbed running suture.  The deep tissues were closed with Vicryl sutures the subcutaneous tissues were closed with interrupted Vicryl sutures and a running barbed 4-0 suture.  The skin was then reinforced with Dermabond and a sterile dressing was placed.   The patient was awoken from anesthesia transferred off of the operating room table onto a hospital bed where examination of leg lengths found the leg lengths to be equal with a good distal pulse.  The patient was then transferred to the PACU in stable condition.

## 2023-11-26 NOTE — Discharge Instructions (Signed)

## 2023-11-26 NOTE — Progress Notes (Addendum)
 Pt states that she can't urinate and refuses pregnancy test. Patient states she hasn't had her menstrual period in years, is on the depo shot, and can't get pregnant. Dr. Joline Ned aware. No new orders at this time. Patient is aware of risks and wishes to proceed with surgery.

## 2023-11-26 NOTE — Progress Notes (Signed)
 Patient declined to have pregnancy test done. Per patient, "I am not pregnant" Team was made aware.

## 2023-11-26 NOTE — Transfer of Care (Signed)
 Immediate Anesthesia Transfer of Care Note  Patient: Karen [name]  Procedure(s) Performed: ARTHROPLASTY, HIP, TOTAL, ANTERIOR APPROACH (Right: Hip)  Patient Location: PACU  Anesthesia Type:General  Level of Consciousness: drowsy  Airway & Oxygen Therapy: Patient Spontanous Breathing and Patient connected to face mask oxygen  Post-op Assessment: Report given to RN and Post -op Vital signs reviewed and stable  Post vital signs: Reviewed and stable  Last Vitals:  Vitals Value Taken Time  BP 100/66   Temp    Pulse 67   Resp 16   SpO2 99     Last Pain:  Vitals:   11/26/23 0821  TempSrc: Temporal  PainSc: 0-No pain         Complications: No notable events documented.

## 2023-11-26 NOTE — Interval H&P Note (Signed)
 Patient history and physical updated. Consent reviewed including risks, benefits, and alternatives to surgery. Patient agrees with above plan to proceed with right anterior total hip arthroplasty.

## 2023-11-26 NOTE — H&P (Signed)
 History of Present Illness: Karen [name] is an 43 y.o. female who presents for evaluation of her right hip. The patient has had severe pain in both hips worse on the right than the left for many years. Pain is localized to her right groin radiating to her right thigh with associated popping cracking and crunching sensations in her groin. She has undergone a hip arthroscopy in 2013 where they did a debridement and cartilage preservation procedure but the hip is slowly worsened since that procedure over time. She has a cane for ambulation and has severe limitations in function due to her hips. She underwent hip injections and the last hip injection she reports only gave her about 30 minutes of relief before the pain came back. Her last hip injection was over a year ago and she has not had any injections in the last 12 weeks. She is here today to review her recent MRI and talk about surgical treatment options. The patient denies fevers, chills, numbness, tingling, shortness of breath, chest pain, recent illness, or any trauma.  Patient is a non-smoker, nondiabetic with a BMI of 33.2  Past Medical History: No past medical history on file.  Past Surgical History: Past Surgical History:  Procedure Laterality Date  CESAREAN SECTION 2009  hand surgery Bilateral  CARPAL TUNNEL  REPAIR EPIGASTRIC HERNIA  RIGHT HIP  TONSILLECTOMY   Past Family History: Family History  Problem Relation Age of Onset  Thyroid disease Paternal Uncle  Stomach cancer Maternal Grandmother  Heart disease Paternal Grandmother  Lung cancer Paternal Grandmother  Heart disease Paternal Grandfather  Lung cancer Paternal Grandfather  Bone cancer Paternal Grandfather   Medications: Current Outpatient Medications  Medication Sig Dispense Refill  ALPRAZolam  (XANAX ) 0.5 MG tablet Take 1 tablet (0.5 mg total) by mouth 3 (three) times daily as needed for sleep or anxiety.  brexpiprazole (REXULTI) 0.5 mg Tab Take 0.5 mg by  mouth once daily  budesonide -glycopyrrolate-formoterol  (BREZTRI AEROSPHERE) 160-9-4.8 mcg/actuation inhaler Inhale 2 inhalations into the lungs 2 (two) times daily  buprenorphine HCl/naloxone HCl (SUBOXONE SL) Place under the tongue 2 (two) times daily  cetirizine (ZYRTEC) 10 MG tablet Take 10 mg by mouth once daily  diazePAM  (VALIUM ) 2 MG tablet Take 2 mg by mouth every 12 (twelve) hours as needed for Anxiety  ERGOCALCIFEROL , VITAMIN D2, ORAL 1 tablet daily- dose unknown  fluticasone  (FLONASE ) 50 mcg/actuation nasal spray Place 2 sprays into both nostrils once daily  ipratropium (ATROVENT) 21 mcg (0.03 %) nasal spray SPRAY 2 SPRAYS INTRANASALLY 3 TIMES A DAY AS NEEDED  medroxyPROGESTERone  (DEPO-PROVERA ) 150 mg/mL injection Inject 150 mg into the muscle every 3 (three) months  methIMAzole (TAPAZOLE) 10 MG tablet Take 2 tablets (20 mg total) by mouth once daily 60 tablet 4  omeprazole (PRILOSEC) 40 MG DR capsule  ondansetron  (ZOFRAN -ODT) 4 MG disintegrating tablet Take 4 mg by mouth every 8 (eight) hours as needed  oxyCODONE  (OXYIR) 10 mg immediate release tablet Take 10 mg by mouth 2 (two) times daily as needed  vortioxetine  (TRINTELLIX ) 10 mg tablet Take 10 mg by mouth once daily   No current facility-administered medications for this visit.   Allergies: Allergies  Allergen Reactions  Diphenhydramine Hcl Other (See Comments)  Leg jumpy  Flexeril [Cyclobenzaprine] Other (See Comments)  Restless legs  Toradol [Ketorolac] Hives  Tramadol Hives    Visit Vitals: There were no vitals filed for this visit.   Review of Systems:  A comprehensive 14 point ROS was performed, reviewed, and the  pertinent orthopaedic findings are documented in the HPI.  Physical Exam: There is no height or weight on file to calculate BMI. General/Constitutional: No apparent distress: well-nourished and well developed. Lymphatic: No palpable adenopathy. Pulmonary exam: Lungs clear to auscultation bilaterally  no wheezing rales or rhonchi Cardiac exam: Regular rate and rhythm no obvious murmurs rubs or gallops. Vascular: No edema, swelling or tenderness, except as noted in detailed exam. Integumentary: No impressive skin lesions present, except as noted in detailed exam. Neuro/Psych: Normal mood and affect, oriented to person, place and time. Musculoskeletal: Normal, except as noted in detailed exam and in HPI.  Bilateral hip exam  SKIN: intact SWELLING: none WARMTH: no warmth TENDERNESS: none, Stinchfield Positive worse on the right ROM: 0 degrees of internal rotation and 30 degrees of external rotation with 80 degrees of hip flexion on the right with severe groin pain with any internal rotation or flexion. 10 degrees of internal rotation and 30 degrees of external rotation on the left with 90 degrees of hip flexion with some lateral groin pain with max flexion internal rotation.  STRENGTH: normal GAIT: antalgic with a cane STABILITY: stable to testing CREPITUS: no LEG LENGTH DISCREPANCY: none NEUROLOGICAL EXAM: normal VASCULAR EXAM: normal LUMBAR SPINE: tenderness: no straight leg raising sign: no motor exam: normal  Hip Imaging :  I have reviewed AP pelvis and lateral hip X-rays (3 views) taken at the last office visit of the bilateral hip which reveal moderate degenerative changes of both hips worse on the right with joint space narrowing and osteophyte formation, subchondral cysts and sclerosis. The right hip has a cam lesion with a pincer style acetabular overhang early. No fracture dislocations noted.  MRI of the right hip without contrast performed on 09/26/2023 images and report reviewed myself. There are moderate to severe degenerative changes of the right hip with very large overgrown pincer style lesion with nearly full-thickness cartilage loss along the superior lateral aspect of the femoral head with cystic changes in the femoral head osteophyte formation on the femoral head and  cystic changes in the acetabulum. Partially agree with radiologist interpretation full read below.  Narrative  This result has an attachment that is not available. CLINICAL DATA: Chronic bilateral hip pain  EXAM: MR OF THE RIGHT HIP WITHOUT CONTRAST  TECHNIQUE: Multiplanar, multisequence MR imaging was performed. No intravenous contrast was administered.  COMPARISON: None Available.  FINDINGS: Bones:  No hip fracture, dislocation or avascular necrosis. Osseous prominence of the superolateral acetabulum bilaterally as can be seen with pincer type femoroacetabular impingement.  No periosteal reaction or bone destruction. No aggressive osseous lesion.  Normal sacrum and sacroiliac joints. No SI joint widening or erosive changes.  Articular cartilage and labrum  Articular cartilage: Mild-moderate partial-thickness cartilage loss of the right femoral head and acetabulum with small marginal osteophytes.  Labrum: Grossly intact, but evaluation is limited by lack of intraarticular fluid.  Joint or bursal effusion  Joint effusion: No hip joint effusion. No SI joint effusion.  Bursae: No bursal fluid.  Muscles and tendons  Flexors: Normal.  Extensors: Normal.  Abductors: Normal.  Adductors: Normal.  Gluteals: Normal.  Hamstrings: Mild tendinosis of the hamstring origin bilaterally.  Other findings  No pelvic free fluid. No fluid collection or hematoma. No inguinal lymphadenopathy. No inguinal hernia.  IMPRESSION: 1. Mild-moderate osteoarthritis of the right hip. 2. Osseous prominence of the superolateral acetabulum bilaterally as can be seen with pincer type femoroacetabular impingement. 3. Mild tendinosis of the hamstring origin bilaterally.  Electronically  Signed By: Onnie Bilis M.D. On: 10/06/2023 11:54  Assessment:  Bilateral hip osteoarthritis  Plan: Karen [name] is a 43 year old female presents with severe bilateral hip degeneration worse on the  right. She has undergone conservative treatments including injections recent MRI shows worsening bony deformity and cartilage loss. Based upon the patient's continued symptoms and failure to respond to conservative treatment, I have recommended a right total hip replacement for this patient. A long discussion took place with the patient describing what a total joint replacement is and what the procedure would entail. A hip model, similar to the implants that will be used during the operation, was utilized to demonstrate the implants. Choices of implant manufactures were discussed and reviewed. The ability to secure the implant utilizing cement or cementless (press fit) fixation was discussed. Anterior and posterior exposures were discussed. For this patient an appropriate approach will be anterior. We did discuss that given her short stature we may have to do a regular table versus the Hana table for the surgery. I discussed the differences in surgical technique with each procedure.  The hospitalization and post-operative care and rehabilitation were also discussed. The use of perioperative antibiotics and DVT prophylaxis were discussed. The risk, benefits and alternatives to a surgical intervention were discussed at length with the patient. The patient was also advised of risks related to the medical comorbidities and elevated body mass index (BMI). A lengthy discussion took place to review the most common complications including but not limited to: deep vein thrombosis, pulmonary embolus, heart attack, stroke, infection, wound breakdown, heterotopic ossification, dislocation, numbness, leg length in-equality, intraoperative fracture, damage to nerves, tendon,muscles, arteries or other blood vessels, death and other possible complications from anesthesia. The patient was told that we will take steps to minimize these risks by using sterile technique, antibiotics and DVT prophylaxis when appropriate and follow the  patient postoperatively in the office setting to monitor progress. The possibility of recurrent pain, no improvement in pain and actual worsening of pain were also discussed with the patient. The risk of dislocation following total hip replacement was discussed and potential precautions to prevent dislocation were reviewed.   Patient asked about and confirms no history of any reactions to metal or metal allergy in the past.  The discharge plan of care focused on the patient going home following surgery. The patient was encouraged to make the necessary arrangements to have someone stay with them when they are discharged home.   The benefits of surgery were discussed with the patient including the potential for improving the patient's current clinical condition through operative intervention. Alternatives to surgical intervention including continued conservative management were also discussed in detail. All questions were answered to the satisfaction of the patient. The patient participated and agreed to the plan of care as well as the use of the recommended implants for their total hip replacement surgery. An information packet was given to the patient to review prior to surgery.   The patient received clearance for surgery. All questions answered patient agrees with above plan for right anterior total hip replacement.  Portions of this record have been created using Scientist, clinical (histocompatibility and immunogenetics). Dictation errors have been sought, but may not have been identified and corrected.  Karen Ginsberg MD

## 2023-11-27 DIAGNOSIS — M160 Bilateral primary osteoarthritis of hip: Secondary | ICD-10-CM | POA: Diagnosis not present

## 2023-11-27 MED ORDER — METHOCARBAMOL 500 MG PO TABS: 500.0000 mg | ORAL_TABLET | Freq: Four times a day (QID) | ORAL | 0 refills | Status: DC

## 2023-11-27 MED ORDER — HYDROMORPHONE HCL 1 MG/ML IJ SOLN
0.5000 mg | Freq: Once | INTRAMUSCULAR | Status: AC
  Administered 2023-11-27: 0.5 mg via INTRAVENOUS
  Filled 2023-11-27 (×2): qty 0.5

## 2023-11-27 MED ORDER — OXYCODONE HCL 10 MG PO TABS
5.0000 mg | ORAL_TABLET | ORAL | 0 refills | Status: DC | PRN
Start: 2023-11-27 — End: 2024-03-10

## 2023-11-27 NOTE — Anesthesia Postprocedure Evaluation (Signed)
 Anesthesia Post Note  Patient: Karen [name]  Procedure(s) Performed: ARTHROPLASTY, HIP, TOTAL, ANTERIOR APPROACH (Right: Hip)  Patient location during evaluation: Nursing Unit Anesthesia Type: Spinal Level of consciousness: awake, awake and alert and oriented Pain management: pain level controlled Vital Signs Assessment: post-procedure vital signs reviewed and stable Respiratory status: spontaneous breathing, nonlabored ventilation and respiratory function stable Cardiovascular status: blood pressure returned to baseline and stable Postop Assessment: no headache and no backache Anesthetic complications: no  No notable events documented.   Last Vitals:  Vitals:   11/27/23 0416 11/27/23 0457  BP: (!) 140/75 (!) 140/75  Pulse: 63 63  Resp: 15 18  Temp: (!) 36.4 C (!) 36.4 C  SpO2: 97%     Last Pain:  Vitals:   11/27/23 0457  TempSrc: Oral  PainSc:                  Angelia Kelp

## 2023-11-27 NOTE — TOC Transition Note (Addendum)
 Transition of Care [facility]) - Discharge Note   Patient Details  Name: Karen [name] MRN: [id number] Date of Birth: 11/24/1980  [facility] CM/SW Contact:  Crayton Docker, RN 11/27/2023, 11:15 AM   Clinical Narrative:     Discharge orders noted for home with home health. DME orders for rolling walker and 3 in 1 bedside commode youth size. CM secure message to Sam Creighton. CM to patient's room regarding home health PT recommendation. Per patient no preferences. Per patient, patient's husband will provide transportation to home.  Noted, patient has Medicaid. CM alert to Dr. Clyda Dark regarding possible outpatient physical therapy   Alert received per Dr. Clyda Dark, patient can attend to outpatient PT.    Barriers to Discharge: No Barriers Identified   Patient Goals and CMS Choice   No preference     Discharge Placement      Home  DME: youth sized rolling walker          Discharge Plan and Services Additional resources added to the After Visit Summary for              DME Arranged: Walker youth DME Agency: [employer] Date DME Agency Contacted: 11/27/23 Time DME Agency Contacted: [phone] Representative spoke with at DME Agency: Sam Creighton   Social Drivers of Health (SDOH) Interventions SDOH Screenings   Food Insecurity: No Food Insecurity (11/26/2023)  Housing: Low Risk  (11/26/2023)  Transportation Needs: No Transportation Needs (11/26/2023)  Utilities: Not At Risk (11/26/2023)  Depression (PHQ2-9): Low Risk  (09/17/2023)  Tobacco Use: Medium Risk (11/26/2023)     Readmission Risk Interventions     No data to display

## 2023-11-27 NOTE — Progress Notes (Signed)
 Physical Therapy Treatment Patient Details Name: Karen [name] MRN: [id number] DOB: 1981-03-10 Today's Date: 11/27/2023   History of Present Illness Pt is s/p R THA on 11/26/23.    PT Comments  Pt was asleep upon arrival but easily awakes and is agreeable to session. No physical assistance required to exit bed, stand, and ambulate > 200 ft. She safely performed stairs to simulate home entry with family member/caregiver present. Pt is cleared from an acute PT standpoint for safe DC home with HHPT to follow.    If plan is discharge home, recommend the following: A little help with walking and/or transfers;Help with stairs or ramp for entrance;Assist for transportation     Equipment Recommendations  Rolling walker (2 wheels);BSC/3in1 (youth/ pediatric)       Precautions / Restrictions Precautions Precautions: Anterior Hip Precaution Booklet Issued: Yes (comment) Recall of Precautions/Restrictions: Intact Restrictions Weight Bearing Restrictions Per Provider Order: Yes RLE Weight Bearing Per Provider Order: Weight bearing as tolerated     Mobility  Bed Mobility Overal bed mobility: Modified Independent   Transfers Overall transfer level: Modified independent   Ambulation/Gait Ambulation/Gait assistance: Modified independent (Device/Increase time) Gait Distance (Feet): 200 Feet Assistive device: Rolling walker (2 wheels) Gait Pattern/deviations: Step-through pattern, Antalgic, Trunk flexed  General Gait Details: pt demonstrated safe steady gait kinematics without LOB. Did use youth RW   Stairs Stairs: Yes Stairs assistance: Supervision, Contact guard assist Stair Management: No rails, Step to pattern, Backwards, Forwards, With walker Number of Stairs: 4 General stair comments: no difficulty of safety concerns with stair performnce. pt's spouse present for training    Balance Overall balance assessment: Modified Independent     Communication Communication Communication:  No apparent difficulties  Cognition Arousal: Alert Behavior During Therapy: WFL for tasks assessed/performed   PT - Cognitive impairments: No apparent impairments    Following commands: Intact      Cueing Cueing Techniques: Verbal cues         Pertinent Vitals/Pain Pain Assessment Pain Assessment: 0-10 Pain Score: 7  Pain Location: R incision of hip Pain Descriptors / Indicators: Discomfort, Guarding, Grimacing Pain Intervention(s): Limited activity within patient's tolerance, Monitored during session, Premedicated before session, Repositioned     PT Goals (current goals can now be found in the care plan section) Acute Rehab PT Goals Patient Stated Goal: go home Progress towards PT goals: Progressing toward goals    Frequency    BID       AM-PAC PT "6 Clicks" Mobility   Outcome Measure  Help needed turning from your back to your side while in a flat bed without using bedrails?: None Help needed moving from lying on your back to sitting on the side of a flat bed without using bedrails?: None Help needed moving to and from a bed to a chair (including a wheelchair)?: A Little Help needed standing up from a chair using your arms (e.g., wheelchair or bedside chair)?: A Little Help needed to walk in hospital room?: A Little Help needed climbing 3-5 steps with a railing? : A Little 6 Click Score: 20    End of Session   Activity Tolerance: Patient tolerated treatment well Patient left: in bed;with call bell/phone within reach;with bed alarm set;with family/visitor present Nurse Communication: Mobility status PT Visit Diagnosis: Difficulty in walking, not elsewhere classified (R26.2);Pain Pain - Right/Left: Right Pain - part of body: Hip     Time: 4098-1191 PT Time Calculation (min) (ACUTE ONLY): 13 min  Charges:    $Gait  Training: 8-22 mins PT General Charges $$ ACUTE PT VISIT: 1 Visit                    Chester Costa PTA 11/27/23, 11:12 AM

## 2023-11-27 NOTE — Progress Notes (Signed)
 Patient is not able to walk the distance required to go the bathroom, or she is unable to safely negotiate stairs required to access the bathroom.  A 3in1 BSC will alleviate this problem.       Lollie Marrow, PA-C [facility]

## 2023-11-27 NOTE — Plan of Care (Signed)
  Problem: Education: Goal: Knowledge of General Education information will improve Description: Including pain rating scale, medication(s)/side effects and non-pharmacologic comfort measures Outcome: Adequate for Discharge   Problem: Health Behavior/Discharge Planning: Goal: Ability to manage health-related needs will improve Outcome: Adequate for Discharge   Problem: Clinical Measurements: Goal: Ability to maintain clinical measurements within normal limits will improve Outcome: Adequate for Discharge Goal: Will remain free from infection Outcome: Adequate for Discharge Goal: Diagnostic test results will improve Outcome: Adequate for Discharge Goal: Respiratory complications will improve Outcome: Adequate for Discharge Goal: Cardiovascular complication will be avoided Outcome: Adequate for Discharge   Problem: Activity: Goal: Risk for activity intolerance will decrease Outcome: Adequate for Discharge   Problem: Nutrition: Goal: Adequate nutrition will be maintained Outcome: Adequate for Discharge   Problem: Coping: Goal: Level of anxiety will decrease Outcome: Adequate for Discharge   Problem: Elimination: Goal: Will not experience complications related to bowel motility Outcome: Adequate for Discharge Goal: Will not experience complications related to urinary retention Outcome: Adequate for Discharge   Problem: Pain Managment: Goal: General experience of comfort will improve and/or be controlled Outcome: Adequate for Discharge   Problem: Safety: Goal: Ability to remain free from injury will improve Outcome: Adequate for Discharge   Problem: Skin Integrity: Goal: Risk for impaired skin integrity will decrease Outcome: Adequate for Discharge   Problem: Education: Goal: Knowledge of the prescribed therapeutic regimen will improve Outcome: Adequate for Discharge Goal: Understanding of discharge needs will improve Outcome: Adequate for Discharge Goal:  Individualized Educational Video(s) Outcome: Adequate for Discharge   Problem: Activity: Goal: Ability to avoid complications of mobility impairment will improve Outcome: Adequate for Discharge Goal: Ability to tolerate increased activity will improve Outcome: Adequate for Discharge   Problem: Clinical Measurements: Goal: Postoperative complications will be avoided or minimized Outcome: Adequate for Discharge   Problem: Pain Management: Goal: Pain level will decrease with appropriate interventions Outcome: Adequate for Discharge   Problem: Skin Integrity: Goal: Will show signs of wound healing Outcome: Adequate for Discharge

## 2023-11-27 NOTE — Plan of Care (Signed)

## 2023-11-27 NOTE — Progress Notes (Signed)
   Subjective: 1 Day Post-Op Procedure(s) (LRB): ARTHROPLASTY, HIP, TOTAL, ANTERIOR APPROACH (Right) Patient reports pain as moderate. Sleeping upon entering room Patient is well, and has had no acute complaints or problems Denies any CP, SOB, ABD pain. We will continue therapy today.  Plan is to go Home after hospital stay.  Objective: Vital signs in last 24 hours: Temp:  [97.2 F (36.2 C)-98.3 F (36.8 C)] 97.5 F (36.4 C) (05/09 0457) Pulse Rate:  [53-108] 63 (05/09 0457) Resp:  [13-21] 18 (05/09 0457) BP: (92-151)/(52-101) 140/75 (05/09 0457) SpO2:  [94 %-100 %] 97 % (05/09 0416) Weight:  [64.3 kg] 64.3 kg (05/09 0457)  Intake/Output from previous day: 05/08 0701 - 05/09 0700 In: 622.6 [I.V.:522.6; IV Piggyback:100] Out: 650 [Urine:500; Blood:150] Intake/Output this shift: No intake/output data recorded.  No results for input(s): "HGB" in the last 72 hours. No results for input(s): "WBC", "RBC", "HCT", "PLT" in the last 72 hours. No results for input(s): "NA", "K", "CL", "CO2", "BUN", "CREATININE", "GLUCOSE", "CALCIUM" in the last 72 hours. No results for input(s): "LABPT", "INR" in the last 72 hours.  EXAM General - Patient is Alert, Appropriate, and Oriented Extremity - Neurovascular intact Sensation intact distally Intact pulses distally Dorsiflexion/Plantar flexion intact No cellulitis present Compartment soft Dressing - dressing C/D/I and no drainage Motor Function - intact, moving foot and toes well on exam.   Past Medical History:  Diagnosis Date   Allergy    Anxiety    Asthma    GERD (gastroesophageal reflux disease)    Internal hemorrhoids    Pneumonia    Primary osteoarthritis of right hip     Assessment/Plan:   1 Day Post-Op Procedure(s) (LRB): ARTHROPLASTY, HIP, TOTAL, ANTERIOR APPROACH (Right) Principal Problem:   S/P total right hip arthroplasty  Estimated body mass index is 34.18 kg/m as calculated from the following:   Height as of  this encounter: 4\' 6"  (1.372 m).   Weight as of this encounter: 64.3 kg. Advance diet Up with therapy Pain better controlled this am.  VSS Labs pending CM to assist with discharge to home with HHPT today pending safe completion of PT goals  DVT Prophylaxis - Lovenox , TED hose, and SCDs Weight-Bearing as tolerated to right leg   T. Thomos Flies, PA-C [facility] 11/27/2023, 7:57 AM

## 2023-11-30 ENCOUNTER — Encounter: Payer: Self-pay | Admitting: Orthopedic Surgery

## 2023-12-14 ENCOUNTER — Other Ambulatory Visit: Payer: Self-pay

## 2023-12-14 MED ORDER — OXYCODONE HCL 10 MG PO TABS: 10.0000 mg | ORAL_TABLET | Freq: Three times a day (TID) | ORAL | 0 refills | Status: DC | PRN

## 2023-12-31 DIAGNOSIS — R110 Nausea: Secondary | ICD-10-CM | POA: Insufficient documentation

## 2023-12-31 DIAGNOSIS — K5900 Constipation, unspecified: Secondary | ICD-10-CM | POA: Insufficient documentation

## 2024-01-08 ENCOUNTER — Ambulatory Visit: Admitting: Family

## 2024-01-11 ENCOUNTER — Other Ambulatory Visit: Payer: Self-pay

## 2024-01-11 DIAGNOSIS — G894 Chronic pain syndrome: Secondary | ICD-10-CM

## 2024-01-11 DIAGNOSIS — M25552 Pain in left hip: Secondary | ICD-10-CM

## 2024-01-12 MED ORDER — OXYCODONE HCL 10 MG PO TABS: 10.0000 mg | ORAL_TABLET | Freq: Three times a day (TID) | ORAL | 0 refills | Status: DC | PRN

## 2024-01-14 ENCOUNTER — Ambulatory Visit: Admitting: Family

## 2024-01-22 ENCOUNTER — Telehealth: Admitting: Physician Assistant

## 2024-01-22 DIAGNOSIS — R3989 Other symptoms and signs involving the genitourinary system: Secondary | ICD-10-CM

## 2024-01-22 MED ORDER — NITROFURANTOIN MONOHYD MACRO 100 MG PO CAPS: 100.0000 mg | ORAL_CAPSULE | Freq: Two times a day (BID) | ORAL | 0 refills | Status: DC

## 2024-01-22 NOTE — Progress Notes (Signed)

## 2024-01-25 ENCOUNTER — Ambulatory Visit: Admitting: Family

## 2024-01-26 ENCOUNTER — Encounter: Payer: Self-pay | Admitting: Family

## 2024-01-26 ENCOUNTER — Ambulatory Visit: Admitting: Family

## 2024-01-27 ENCOUNTER — Other Ambulatory Visit: Payer: Self-pay

## 2024-01-27 MED ORDER — ALPRAZOLAM 0.5 MG PO TABS: 0.5000 mg | ORAL_TABLET | Freq: Three times a day (TID) | ORAL | 2 refills | Status: DC | PRN

## 2024-02-08 ENCOUNTER — Other Ambulatory Visit: Payer: Self-pay

## 2024-02-08 DIAGNOSIS — G894 Chronic pain syndrome: Secondary | ICD-10-CM

## 2024-02-08 DIAGNOSIS — M25551 Pain in right hip: Secondary | ICD-10-CM

## 2024-02-10 ENCOUNTER — Encounter: Payer: Self-pay | Admitting: Family

## 2024-02-10 MED ORDER — OXYCODONE HCL 10 MG PO TABS
10.0000 mg | ORAL_TABLET | Freq: Three times a day (TID) | ORAL | 0 refills | Status: DC | PRN
Start: 2024-02-10 — End: 2024-04-05

## 2024-02-11 ENCOUNTER — Other Ambulatory Visit: Payer: Self-pay

## 2024-02-12 ENCOUNTER — Encounter: Payer: Self-pay | Admitting: Family

## 2024-02-12 ENCOUNTER — Other Ambulatory Visit: Payer: Self-pay

## 2024-02-12 MED ORDER — ONDANSETRON 4 MG PO TBDP: 4.0000 mg | ORAL_TABLET | Freq: Three times a day (TID) | ORAL | 1 refills | Status: DC | PRN

## 2024-02-15 ENCOUNTER — Encounter: Payer: Self-pay | Admitting: Family

## 2024-02-15 NOTE — Progress Notes (Signed)
 Established Patient Office Visit  Subjective:  Patient ID: Karen [name], adult    DOB: 1981/04/19  Age: 43 y.o. MRN: [id number]  Chief Complaint  Patient presents with   Follow-up    Patient is here today for follow up before her hip surgery.  She is trying to make sure we have her taken care of with her medications.   She is scheduled for surgery soon.     No other concerns at this time.   Past Medical History:  Diagnosis Date   Allergy    Anxiety    Asthma    GERD (gastroesophageal reflux disease)    Internal hemorrhoids    Pneumonia    Primary osteoarthritis of right hip     Past Surgical History:  Procedure Laterality Date   CARPAL TUNNEL RELEASE Bilateral 2010   CESAREAN SECTION  2009   COLONOSCOPY     EPIGASTRIC HERNIA REPAIR N/A 09/03/2015   Procedure: HERNIA REPAIR EPIGASTRIC ADULT;  Surgeon: Reyes LELON Cota, MD;  Location: [facility] ORS;  Service: General;  Laterality: N/A;   HIP SURGERY  2013   [facility]ST [facility]  2002   [facility] [place]   TONSILLECTOMY  2010   TOTAL HIP ARTHROPLASTY Right 11/26/2023   Procedure: ARTHROPLASTY, HIP, TOTAL, ANTERIOR APPROACH;  Surgeon: Lorelle Hussar, MD;  Location: [facility] ORS;  Service: Orthopedics;  Laterality: Right;    Social History   Socioeconomic History   Marital status: Married    Spouse name: Blanchard,Steven (Significant other)   Number of children: 1   Years of education: Not on file   Highest education level: Not on file  Occupational History   Not on file  Tobacco Use   Smoking status: Former    Current packs/day: 0.00    Average packs/day: 0.5 packs/day for 18.0 years (9.0 ttl pk-yrs)    Types: Cigarettes    Start date: 07/21/1996    Quit date: 07/21/2014    Years since quitting: 9.5   Smokeless tobacco: Never  Substance and Sexual Activity   Alcohol use: No    Alcohol/week: 0.0 standard drinks of alcohol   Drug use: No   Sexual activity: Yes  Other Topics Concern   Not on file  Social  History Narrative   Not on file   Social Drivers of Health   Financial Resource Strain: Medium Risk (01/13/2024)   [facility]   Overall Financial Resource Strain (CARDIA)    Difficulty of Paying Living Expenses: Somewhat hard  Food Insecurity: No Food Insecurity (01/13/2024)   Received from [facility]   Hunger Vital Sign    Within the past 12 months, you worried that your food would run out before you got the money to buy more.: Never true    Within the past 12 months, the food you bought just didn't last and you didn't have money to get more.: Never true  Transportation Needs: No Transportation Needs (01/13/2024)   Received from [facility] - Transportation    In the past 12 months, has lack of transportation kept you from medical appointments or from getting medications?: No    Lack of Transportation (Non-Medical): No  Physical Activity: Not on file  Stress: Not on file  Social Connections: Not on file  Intimate Partner Violence: Not At Risk (11/27/2023)   Humiliation, Afraid, Rape, and Kick questionnaire    Fear of Current or Ex-Partner: No    Emotionally  Abused: No    Physically Abused: No    Sexually Abused: No    Family History  Problem Relation Age of Onset   COPD Mother    COPD Father     Allergies  Allergen Reactions   Benadryl [Diphenhydramine Hcl] Other (See Comments)    Leg jumpy   Cyclobenzaprine Other (See Comments)     legs jumpy  Restless legs   Ketorolac Hives   Motrin [Ibuprofen] Hives   Ketorolac Tromethamine Rash   Tramadol Rash and Hives    Review of Systems  All other systems reviewed and are negative.      Objective:   BP 110/76   Pulse 63   Ht 4' 8 (1.422 m)   Wt 144 lb 6.4 oz (65.5 kg)   LMP 09/27/2016   SpO2 97%   BMI 32.37 kg/m   Vitals:   11/16/23 1407  BP: 110/76  Pulse: 63  Height: 4' 8 (1.422 m)  Weight: 144 lb 6.4 oz (65.5 kg)  SpO2:  97%  BMI (Calculated): 32.39    Physical Exam Vitals and nursing note reviewed.  Constitutional:      Appearance: Normal appearance. She is normal weight.  HENT:     Head: Normocephalic.  Eyes:     Extraocular Movements: Extraocular movements intact.     Conjunctiva/sclera: Conjunctivae normal.     Pupils: Pupils are equal, round, and reactive to light.  Cardiovascular:     Rate and Rhythm: Normal rate.  Pulmonary:     Effort: Pulmonary effort is normal.  Neurological:     General: No focal deficit present.     Mental Status: She is alert and oriented to person, place, and time. Mental status is at baseline.  Psychiatric:        Mood and Affect: Mood normal.        Behavior: Behavior normal.        Thought Content: Thought content normal.        Judgment: Judgment normal.      No results found for any visits on 11/16/23.  Recent Results (from the past 2160 hours)  CBC WITH DIFFERENTIAL     Status: Abnormal   Collection Time: 11/20/23  9:17 AM  Result Value Ref Range   WBC 5.9 4.0 - 10.5 K/uL   RBC 4.64 3.87 - 5.11 MIL/uL   Hemoglobin 11.2 (L) 12.0 - 15.0 g/dL   HCT 62.3 63.9 - 53.9 %   MCV 81.0 80.0 - 100.0 fL   MCH 24.1 (L) 26.0 - 34.0 pg   MCHC 29.8 (L) 30.0 - 36.0 g/dL   RDW 84.1 (H) 88.4 - 84.4 %   Platelets 305 150 - 400 K/uL   nRBC 0.0 0.0 - 0.2 %   Neutrophils Relative % 64 %   Neutro Abs 3.7 1.7 - 7.7 K/uL   Lymphocytes Relative 22 %   Lymphs Abs 1.3 0.7 - 4.0 K/uL   Monocytes Relative 7 %   Monocytes Absolute 0.4 0.1 - 1.0 K/uL   Eosinophils Relative 6 %   Eosinophils Absolute 0.3 0.0 - 0.5 K/uL   Basophils Relative 1 %   Basophils Absolute 0.1 0.0 - 0.1 K/uL   Immature Granulocytes 0 %   Abs Immature Granulocytes 0.02 0.00 - 0.07 K/uL    Comment: Performed at [facility], [address] [place], [place] [address]  Comprehensive metabolic panel     Status: Abnormal   Collection Time: 11/20/23  9:17 AM  Result Value Ref Range   Sodium  138 135 - 145 mmol/L   Potassium 3.7 3.5 - 5.1 mmol/L   Chloride 103 98 - 111 mmol/L   CO2 25 22 - 32 mmol/L   Glucose, Bld 83 70 - 99 mg/dL    Comment: Glucose reference range applies only to samples taken after fasting for at least 8 hours.   BUN 10 6 - 20 mg/dL   Creatinine, Ser 8.93 (H) 0.44 - 1.00 mg/dL   Calcium 8.5 (L) 8.9 - 10.3 mg/dL   Total Protein 7.2 6.5 - 8.1 g/dL   Albumin 3.8 3.5 - 5.0 g/dL   AST 18 15 - 41 U/L   ALT 13 0 - 44 U/L   Alkaline Phosphatase 74 38 - 126 U/L   Total Bilirubin 0.3 0.0 - 1.2 mg/dL   GFR, Estimated >39 >39 mL/min    Comment: (NOTE) Calculated using the CKD-EPI Creatinine Equation (2021)    Anion gap 10 5 - 15    Comment: Performed at [facility], [address] [place], [place] [address]  Urinalysis, Routine w reflex microscopic -Urine, Clean Catch     Status: Abnormal   Collection Time: 11/20/23  9:23 AM  Result Value Ref Range   Color, Urine YELLOW (A) YELLOW   APPearance CLOUDY (A) CLEAR   Specific Gravity, Urine 1.024 1.005 - 1.030   pH 5.0 5.0 - 8.0   Glucose, UA NEGATIVE NEGATIVE mg/dL   Hgb urine dipstick NEGATIVE NEGATIVE   Bilirubin Urine NEGATIVE NEGATIVE   Ketones, ur NEGATIVE NEGATIVE mg/dL   Protein, ur NEGATIVE NEGATIVE mg/dL   Nitrite NEGATIVE NEGATIVE   Leukocytes,Ua MODERATE (A) NEGATIVE   RBC / HPF 0-5 0 - 5 RBC/hpf   WBC, UA 6-10 0 - 5 WBC/hpf   Bacteria, UA MANY (A) NONE SEEN   Squamous Epithelial / HPF 21-50 0 - 5 /HPF   Mucus PRESENT     Comment: Performed at [facility], [address], [place], [place] [address]  Surgical pcr screen     Status: Abnormal   Collection Time: 11/20/23  9:23 AM   Specimen: Nasal Mucosa; Nasal Swab  Result Value Ref Range   MRSA, PCR NEGATIVE NEGATIVE   Staphylococcus aureus POSITIVE (A) NEGATIVE    Comment: (NOTE) The Xpert SA Assay (FDA approved for NASAL specimens in patients 109 years of age and older), is one component of a comprehensive surveillance  program. It is not intended to diagnose infection nor to guide or monitor treatment. Performed at [facility], [address] [place] [address] [place], [place] [address]   Type and screen [facility]     Status: None   Collection Time: 11/20/23  9:23 AM  Result Value Ref Range   ABO/RH(D) O POS    Antibody Screen NEG    Sample Expiration 12/04/2023,2359    Extend sample reason      NO TRANSFUSIONS OR PREGNANCY IN THE PAST 3 MONTHS Performed at [facility], [address] [place], [place] [address]   ABO/Rh     Status: None   Collection Time: 11/26/23  8:13 AM  Result Value Ref Range   ABO/RH(D)      O POS Performed at [facility], [address], [place], [place] [address]        Assessment & Plan Encounter for other preprocedural examination Chronic pain syndrome Pain of both hip joints Patient is scheduled for surgery, will defer to ortho  as needed for further decisions.   Will await notes from surgery and will follow up with patient after discharge.     Return after surgery.   Total time spent: 20 minutes  ALAN CHRISTELLA ARRANT, FNP  11/16/2023   This document may have been prepared by [facility] Voice Recognition software and as such may include unintentional dictation errors.

## 2024-02-15 NOTE — Assessment & Plan Note (Signed)
 Patient is scheduled for surgery, will defer to ortho as needed for further decisions.   Will await notes from surgery and will follow up with patient after discharge.

## 2024-02-18 ENCOUNTER — Other Ambulatory Visit: Payer: Self-pay | Admitting: Orthopedic Surgery

## 2024-02-18 DIAGNOSIS — S83241A Other tear of medial meniscus, current injury, right knee, initial encounter: Secondary | ICD-10-CM

## 2024-02-26 ENCOUNTER — Encounter: Payer: Self-pay | Admitting: Family

## 2024-02-26 ENCOUNTER — Ambulatory Visit: Admitting: Family

## 2024-02-27 ENCOUNTER — Ambulatory Visit
Admission: RE | Admit: 2024-02-27 | Discharge: 2024-02-27 | Disposition: A | Discharge status: Home / Self Care | Source: Ambulatory Visit | Attending: Orthopedic Surgery | Admitting: Orthopedic Surgery

## 2024-02-27 DIAGNOSIS — S83241A Other tear of medial meniscus, current injury, right knee, initial encounter: Secondary | ICD-10-CM | POA: Diagnosis present

## 2024-02-29 ENCOUNTER — Other Ambulatory Visit: Payer: Self-pay | Admitting: Family

## 2024-02-29 DIAGNOSIS — F322 Major depressive disorder, single episode, severe without psychotic features: Secondary | ICD-10-CM

## 2024-03-08 ENCOUNTER — Other Ambulatory Visit: Payer: Self-pay

## 2024-03-08 DIAGNOSIS — M25551 Pain in right hip: Secondary | ICD-10-CM

## 2024-03-08 DIAGNOSIS — G894 Chronic pain syndrome: Secondary | ICD-10-CM

## 2024-03-09 ENCOUNTER — Other Ambulatory Visit: Payer: Self-pay

## 2024-03-10 ENCOUNTER — Other Ambulatory Visit: Payer: Self-pay | Admitting: Internal Medicine

## 2024-03-10 ENCOUNTER — Encounter: Payer: Self-pay | Admitting: Family

## 2024-03-10 DIAGNOSIS — M25551 Pain in right hip: Secondary | ICD-10-CM

## 2024-03-10 DIAGNOSIS — G894 Chronic pain syndrome: Secondary | ICD-10-CM

## 2024-03-10 MED ORDER — OXYCODONE HCL 10 MG PO TABS: 10.0000 mg | ORAL_TABLET | Freq: Every day | ORAL | 0 refills | Status: DC

## 2024-03-25 ENCOUNTER — Telehealth: Payer: Self-pay | Admitting: Family

## 2024-03-25 ENCOUNTER — Telehealth: Payer: Self-pay

## 2024-03-25 NOTE — Telephone Encounter (Signed)
 Entered in error

## 2024-03-25 NOTE — Telephone Encounter (Signed)
 Patient called asking for more pain medication stating that her hip is bothering her but in her office note from her surgeon on 8/27 she reported no pain, patient was made aware that we would not be giving her any more pain medication and I gave her the number for the pain clinic that we referred her too on 8/21 so she can call to try and speed up the process

## 2024-03-26 ENCOUNTER — Other Ambulatory Visit: Payer: Self-pay | Admitting: Family

## 2024-03-30 ENCOUNTER — Encounter: Payer: Self-pay | Admitting: Family

## 2024-04-05 ENCOUNTER — Encounter: Payer: Self-pay | Admitting: Family

## 2024-04-05 ENCOUNTER — Ambulatory Visit: Admitting: Family

## 2024-04-05 VITALS — BP 120/78 | HR 83 | Ht <= 58 in | Wt 130.8 lb

## 2024-04-05 DIAGNOSIS — F4310 Post-traumatic stress disorder, unspecified: Secondary | ICD-10-CM

## 2024-04-05 DIAGNOSIS — M25551 Pain in right hip: Secondary | ICD-10-CM | POA: Diagnosis not present

## 2024-04-05 DIAGNOSIS — Z0130 Encounter for examination of blood pressure without abnormal findings: Secondary | ICD-10-CM

## 2024-04-05 DIAGNOSIS — F411 Generalized anxiety disorder: Secondary | ICD-10-CM | POA: Diagnosis not present

## 2024-04-05 DIAGNOSIS — M25552 Pain in left hip: Secondary | ICD-10-CM | POA: Diagnosis not present

## 2024-04-05 DIAGNOSIS — Z79899 Other long term (current) drug therapy: Secondary | ICD-10-CM

## 2024-04-05 NOTE — Progress Notes (Unsigned)
 Established Patient Office Visit  Subjective:  Patient ID: Karen [name], female    DOB: 06-Jun-1981  Age: 43 y.o. MRN: [id number]  Chief Complaint  Patient presents with   Follow-up    Discuss ADHD meds and pain meds    Patient is here today for her follow up.  She has been feeling {DESC; WELL/FAIRLY WELL/POORLY:18703} since last appointment.   She {DOES NOT does:27190} have additional concerns to discuss today.  {apptlabs:33170} She {Needs / does not need:13302} refills.   I have reviewed her {Reviewed:14835} for her appointment today.     No other concerns at this time.   Past Medical History:  Diagnosis Date   Allergy    Anxiety    Asthma    GERD (gastroesophageal reflux disease)    Internal hemorrhoids    Pneumonia    Primary osteoarthritis of right hip     Past Surgical History:  Procedure Laterality Date   CARPAL TUNNEL RELEASE Bilateral 2010   CESAREAN SECTION  2009   COLONOSCOPY     EPIGASTRIC HERNIA REPAIR N/A 09/03/2015   Procedure: HERNIA REPAIR EPIGASTRIC ADULT;  Surgeon: Reyes LELON Cota, MD;  Location: [facility] ORS;  Service: General;  Laterality: N/A;   HIP SURGERY  2013   OVARIAN CYST [facility]GERY  2002   [facility] [place]   TONSILLECTOMY  2010   TOTAL HIP ARTHROPLASTY Right 11/26/2023   Procedure: ARTHROPLASTY, HIP, TOTAL, ANTERIOR APPROACH;  Surgeon: Lorelle Hussar, MD;  Location: [facility] ORS;  Service: Orthopedics;  Laterality: Right;    Social History   Socioeconomic History   Marital status: Married    Spouse name: Harrigan,Steven (Significant other)   Number of children: 1   Years of education: Not on file   Highest education level: Not on file  Occupational History   Not on file  Tobacco Use   Smoking status: Former    Current packs/day: 0.00    Average packs/day: 0.5 packs/day for 18.0 years (9.0 ttl pk-yrs)    Types: Cigarettes    Start date: 07/21/1996    Quit date: 07/21/2014    Years since quitting: 9.7   Smokeless tobacco: Never   Substance and Sexual Activity   Alcohol use: No    Alcohol/week: 0.0 standard drinks of alcohol   Drug use: No   Sexual activity: Yes  Other Topics Concern   Not on file  Social History Narrative   Not on file   Social Drivers of Health   Financial Resource Strain: Medium Risk (01/13/2024)   Received from [facility]   Overall Financial Resource Strain (CARDIA)    Difficulty of Paying Living Expenses: Somewhat hard  Food Insecurity: No Food Insecurity (01/13/2024)   Received from [facility]   Hunger Vital Sign    Within the past 12 months, you worried that your food would run out before you got the money to buy more.: Never true    Within the past 12 months, the food you bought just didn't last and you didn't have money to get more.: Never true  Transportation Needs: No Transportation Needs (01/13/2024)   Received from [facility] Transportation    In the past 12 months, has lack of transportation kept you from medical appointments or from getting medications?: No    Lack of Transportation (Non-Medical): No  Physical Activity: Not on file  Stress: Not on file  Social Connections: Not on file  Intimate Partner Violence:  Not At Risk (11/27/2023)   Humiliation, Afraid, Rape, and Kick questionnaire    Fear of Current or Ex-Partner: No    Emotionally Abused: No    Physically Abused: No    Sexually Abused: No    Family History  Problem Relation Age of Onset   COPD Mother    COPD Father     Allergies  Allergen Reactions   Benadryl [Diphenhydramine Hcl] Other (See Comments)    Leg jumpy   Cyclobenzaprine Other (See Comments)     legs jumpy  Restless legs   Ketorolac Hives   Motrin [Ibuprofen] Hives   Ketorolac Tromethamine Rash   Tramadol Rash and Hives    Review of Systems  All other systems reviewed and are negative.      Objective:   BP 120/78   Pulse 83   Ht 4' 8 (1.422 m)   Wt 130 lb  12.8 oz (59.3 kg)   LMP 09/27/2016   SpO2 99%   BMI 29.32 kg/m   Vitals:   04/05/24 1319  BP: 120/78  Pulse: 83  Height: 4' 8 (1.422 m)  Weight: 130 lb 12.8 oz (59.3 kg)  SpO2: 99%  BMI (Calculated): 29.34    Physical Exam Vitals and nursing note reviewed.  Constitutional:      Appearance: Normal appearance. She is normal weight.  HENT:     Head: Normocephalic.  Eyes:     Extraocular Movements: Extraocular movements intact.     Conjunctiva/sclera: Conjunctivae normal.     Pupils: Pupils are equal, round, and reactive to light.  Cardiovascular:     Rate and Rhythm: Normal rate.  Pulmonary:     Effort: Pulmonary effort is normal.  Neurological:     General: No focal deficit present.     Mental Status: She is alert and oriented to person, place, and time. Mental status is at baseline.  Psychiatric:        Mood and Affect: Mood normal.        Behavior: Behavior normal.        Thought Content: Thought content normal.      No results found for any visits on 04/05/24.  No results found for this or any previous visit (from the past 2160 hours).     Assessment & Plan:   Assessment & Plan Pain of both hip joints  Generalized anxiety disorder  PTSD (post-traumatic stress disorder)     No follow-ups on file.   Total time spent: {AMA time spent:29001} minutes  ALAN CHRISTELLA ARRANT, FNP  04/05/2024   This document may have been prepared by [facility] Voice Recognition software and as such may include unintentional dictation errors.

## 2024-04-07 ENCOUNTER — Encounter: Payer: Self-pay | Admitting: Family

## 2024-04-07 LAB — TOXASSURE SELECT 13 (MW), URINE

## 2024-04-07 NOTE — Assessment & Plan Note (Signed)
 UDS obtained in office today.  Will call with results when available.   Patient stable.  Well controlled with current therapy.   Continue current meds.

## 2024-04-07 NOTE — Assessment & Plan Note (Addendum)
 UDS in office today.  Setting up referral to pain management.  Will call pt with results when available.

## 2024-04-08 ENCOUNTER — Encounter: Payer: Self-pay | Admitting: Family

## 2024-04-10 ENCOUNTER — Encounter: Payer: Self-pay | Admitting: Family

## 2024-04-12 ENCOUNTER — Encounter: Payer: Self-pay | Admitting: Family

## 2024-04-14 ENCOUNTER — Encounter: Payer: Self-pay | Admitting: Family

## 2024-04-15 ENCOUNTER — Other Ambulatory Visit: Payer: Self-pay

## 2024-04-15 DIAGNOSIS — N6332 Unspecified lump in axillary tail of the left breast: Secondary | ICD-10-CM

## 2024-04-15 DIAGNOSIS — R1084 Generalized abdominal pain: Secondary | ICD-10-CM

## 2024-04-15 MED ORDER — ALPRAZOLAM 0.5 MG PO TABS: 0.5000 mg | ORAL_TABLET | Freq: Three times a day (TID) | ORAL | 2 refills | Status: DC | PRN

## 2024-04-15 NOTE — Telephone Encounter (Signed)
 See other message

## 2024-04-15 NOTE — Telephone Encounter (Signed)
 Pended orders and informed pt to come get labs

## 2024-04-15 NOTE — Telephone Encounter (Signed)
 Rx has been pended

## 2024-04-15 NOTE — Telephone Encounter (Signed)
 Pended rx to [facility]

## 2024-04-15 NOTE — Telephone Encounter (Signed)
 Mychart message sent to pt to come do UA

## 2024-04-18 ENCOUNTER — Encounter: Payer: Self-pay | Admitting: Family

## 2024-04-18 ENCOUNTER — Other Ambulatory Visit: Payer: Self-pay | Admitting: Family

## 2024-04-18 DIAGNOSIS — N6332 Unspecified lump in axillary tail of the left breast: Secondary | ICD-10-CM

## 2024-04-20 ENCOUNTER — Other Ambulatory Visit

## 2024-04-21 ENCOUNTER — Encounter

## 2024-04-21 ENCOUNTER — Other Ambulatory Visit

## 2024-04-22 ENCOUNTER — Other Ambulatory Visit

## 2024-04-22 ENCOUNTER — Encounter

## 2024-04-25 ENCOUNTER — Ambulatory Visit: Admitting: Family

## 2024-04-25 ENCOUNTER — Encounter: Payer: Self-pay | Admitting: Family

## 2024-04-25 VITALS — BP 110/76 | HR 88 | Ht <= 58 in | Wt 139.2 lb

## 2024-04-25 DIAGNOSIS — Z0130 Encounter for examination of blood pressure without abnormal findings: Secondary | ICD-10-CM

## 2024-04-25 DIAGNOSIS — N6332 Unspecified lump in axillary tail of the left breast: Secondary | ICD-10-CM | POA: Diagnosis not present

## 2024-04-25 DIAGNOSIS — R1084 Generalized abdominal pain: Secondary | ICD-10-CM | POA: Diagnosis not present

## 2024-04-25 DIAGNOSIS — R41840 Attention and concentration deficit: Secondary | ICD-10-CM

## 2024-04-25 DIAGNOSIS — F411 Generalized anxiety disorder: Secondary | ICD-10-CM

## 2024-04-25 MED ORDER — ALPRAZOLAM 1 MG PO TABS: 1.0000 mg | ORAL_TABLET | Freq: Two times a day (BID) | ORAL | 0 refills | Status: DC | PRN

## 2024-04-25 NOTE — Progress Notes (Signed)
 Established Patient Office Visit  Subjective:  Patient ID: Karen [name], female    DOB: 1980-09-30  Age: 43 y.o. MRN: [id number]  Chief Complaint  Patient presents with   Follow-up    Discuss stomach issues and concentration concerns    Patient is here today for follow up.  She has been having abdominal pain and found a lump in her left breast/axilla, but these have already been set up for imaging and we are just waiting on results.   She does say that her anxiety has been much worse than normal, her mother is dealing with stage iv cancer, and her father is sick as well.   Anxiety Presents for follow-up visit. Symptoms include chest pain, decreased concentration, depressed mood, excessive worry, irritability, malaise, nausea, nervous/anxious behavior, palpitations, panic and shortness of breath. Symptoms occur constantly. The severity of symptoms is severe, interfering with daily activities, causing significant distress and incapacitating. The quality of sleep is poor. Nighttime awakenings: one to two.   Compliance with medications is 76-100%.  Abdominal Pain This is a recurrent problem. The current episode started 1 to 4 weeks ago. The onset quality is gradual. The problem occurs intermittently. The problem has been waxing and waning. The pain is located in the generalized abdominal region. The pain is at a severity of 10/10. The pain is severe. The quality of the pain is sharp and cramping. The abdominal pain does not radiate. Associated symptoms include nausea and vomiting. The pain is aggravated by eating. The pain is relieved by Nothing. She has tried antacids, acetaminophen , proton pump inhibitors and H2 blockers for the symptoms. The treatment provided no relief. Prior diagnostic workup includes CT scan.    No other concerns at this time.   Past Medical History:  Diagnosis Date   Allergy    Anxiety    Asthma    GERD (gastroesophageal reflux disease)    Internal  hemorrhoids    Pneumonia    Primary osteoarthritis of right hip     Past Surgical History:  Procedure Laterality Date   CARPAL TUNNEL RELEASE Bilateral 2010   CESAREAN SECTION  2009   COLONOSCOPY     EPIGASTRIC HERNIA REPAIR N/A 09/03/2015   Procedure: HERNIA REPAIR EPIGASTRIC ADULT;  Surgeon: Reyes LELON Cota, MD;  Location: [facility];  Service: General;  Laterality: N/A;   HIP SURGERY  2013   [facility]  2002   [facility] [place]   TONSILLECTOMY  2010   TOTAL HIP ARTHROPLASTY Right 11/26/2023   Procedure: ARTHROPLASTY, HIP, TOTAL, ANTERIOR APPROACH;  Surgeon: Lorelle Hussar, MD;  Location: [facility];  Service: Orthopedics;  Laterality: Right;    Social History   Socioeconomic History   Marital status: Married    Spouse name: Sebastiani,Steven (Significant other)   Number of children: 1   Years of education: Not on file   Highest education level: Not on file  Occupational History   Not on file  Tobacco Use   Smoking status: Former    Current packs/day: 0.00    Average packs/day: 0.5 packs/day for 18.0 years (9.0 ttl pk-yrs)    Types: Cigarettes    Start date: 07/21/1996    Quit date: 07/21/2014    Years since quitting: 9.7   Smokeless tobacco: Never  Substance and Sexual Activity   Alcohol use: No    Alcohol/week: 0.0 standard drinks of alcohol   Drug use: No   Sexual activity: Yes  Other Topics Concern   Not on  file  Social History Narrative   Not on file   Social Drivers of Health   Financial Resource Strain: Medium Risk (01/13/2024)   Received from [facility]   Overall Financial Resource Strain (CARDIA)    Difficulty of Paying Living Expenses: Somewhat hard  Food Insecurity: No Food Insecurity (01/13/2024)   Received from [facility]   Hunger Vital Sign    Within the past 12 months, you worried that your food would run out before you got the money to buy more.: Never true    Within the past 12 months, the food you  bought just didn't last and you didn't have money to get more.: Never true  Transportation Needs: No Transportation Needs (01/13/2024)   Received from [facility] - Transportation    In the past 12 months, has lack of transportation kept you from medical appointments or from getting medications?: No    Lack of Transportation (Non-Medical): No  Physical Activity: Not on file  Stress: Not on file  Social Connections: Not on file  Intimate Partner Violence: Not At Risk (11/27/2023)   Humiliation, Afraid, Rape, and Kick questionnaire    Fear of Current or Ex-Partner: No    Emotionally Abused: No    Physically Abused: No    Sexually Abused: No    Family History  Problem Relation Age of Onset   COPD Mother    COPD Father     Allergies  Allergen Reactions   Benadryl [Diphenhydramine Hcl] Other (See Comments)    Leg jumpy   Cyclobenzaprine Other (See Comments)     legs jumpy  Restless legs   Ketorolac Hives   Motrin [Ibuprofen] Hives   Ketorolac Tromethamine Rash   Tramadol Rash and Hives    Review of Systems  Constitutional:  Positive for irritability.  Respiratory:  Positive for shortness of breath.   Cardiovascular:  Positive for chest pain and palpitations.  Gastrointestinal:  Positive for abdominal pain, nausea and vomiting.  Psychiatric/Behavioral:  Positive for decreased concentration and depression. The patient is nervous/anxious.   All other systems reviewed and are negative.      Objective:   BP 110/76   Pulse 88   Ht 4' 8 (1.422 m)   Wt 139 lb 3.2 oz (63.1 kg)   LMP 09/27/2016   SpO2 99%   BMI 31.21 kg/m   Vitals:   04/25/24 1102  BP: 110/76  Pulse: 88  Height: 4' 8 (1.422 m)  Weight: 139 lb 3.2 oz (63.1 kg)  SpO2: 99%  BMI (Calculated): 31.23    Physical Exam Vitals and nursing note reviewed.  Constitutional:      General: She is awake.     Appearance: Normal appearance. She is well-developed and overweight.   HENT:     Head: Normocephalic and atraumatic.  Eyes:     Extraocular Movements: Extraocular movements intact.     Conjunctiva/sclera: Conjunctivae normal.     Pupils: Pupils are equal, round, and reactive to light.  Cardiovascular:     Rate and Rhythm: Normal rate.  Pulmonary:     Effort: Pulmonary effort is normal.  Chest:  Breasts:    Left: Mass present.  Abdominal:     Tenderness: There is abdominal tenderness.  Neurological:     General: No focal deficit present.     Mental Status: She is alert and oriented to person, place, and time. Mental status is at baseline.  Psychiatric:  Mood and Affect: Mood normal.        Behavior: Behavior normal. Behavior is cooperative.        Thought Content: Thought content normal.        Judgment: Judgment normal.      No results found for any visits on 04/25/24.  Recent Results (from the past 2160 hours)  Tox[employer] Select 13 (MW), Urine     Status: None   Collection Time: 04/05/24  3:57 PM  Result Value Ref Range   Summary FINAL     Comment: ==================================================================== ToxASSU[employer] Select 13 (MW) ==================================================================== Test                             Result       Flag       Units  Drug Present   Oxazepam                       41                      ng/mg creat    Oxazepam may be administered as a scheduled prescription medication;    it is also an expected metabolite of other benzodiazepine drugs,    including diazepam , chlordiazepoxide, prazepam, clorazepate,    halazepam, and temazepam.    Carboxy-THC                    293                     ng/mg creat    Carboxy-THC is a metabolite of tetrahydrocannabinol (THC). Source of    THC is most commonly herbal marijuana or marijuana-based products,    but THC is also present in a scheduled prescription medication.    Trace amounts of THC can be present in hemp and cannabidiol (CBD)     products. This test is not intended to distinguish between delta-9-    tetrahydrocannabin ol, the predominant form of THC in most herbal or    marijuana-based products, and delta-8-tetrahydrocannabinol.    Oxycodone                       34                      ng/mg creat   Oxymorphone                    36                      ng/mg creat   Noroxycodone                   382                     ng/mg creat   Noroxymorphone                 162                     ng/mg creat    Sources of oxycodone  are scheduled prescription medications.    Oxymorphone, noroxycodone, and noroxymorphone are expected    metabolites of oxycodone . Oxymorphone is also available as a    scheduled prescription medication.    Buprenorphine  235                     ng/mg creat   Norbuprenorphine               >437                    ng/mg creat    Source of buprenorphine is a scheduled prescription medication.    Norbuprenorphine is an expected metabolite of buprenorphine.  ==================================================================== Test                       Result    Flag   Units      Ref Range   Creatinine              229              mg/dL      >=79 ==================================================================== Declared Medications:  Medication list was not provided. ==================================================================== For clinical consultation, please call [phone]. ====================================================================        Assessment & Plan Anxiety state Generalized anxiety disorder Patient stable.  Well controlled with current therapy.   Continue current meds.   Lump of axillary tail of left breast US  already sent to imaging dept for pt.  Will await results and contact pt with next results at that time.   Reassess as needed at follow up.   Generalized abdominal pain Imaging already ordered and scheduled.  Will await  results and determine next steps based on these.   Reassess at follow up.     Return as previously scheduled.   Total time spent: 20 minutes  ALAN CHRISTELLA ARRANT, FNP  04/25/2024   This document may have been prepared by [facility] Voice Recognition software and as such may include unintentional dictation errors.

## 2024-04-27 ENCOUNTER — Other Ambulatory Visit

## 2024-04-28 ENCOUNTER — Other Ambulatory Visit

## 2024-04-28 ENCOUNTER — Encounter

## 2024-05-01 ENCOUNTER — Encounter: Payer: Self-pay | Admitting: Family

## 2024-05-01 NOTE — Assessment & Plan Note (Signed)
 Patient stable.  Well controlled with current therapy.   Continue current meds.

## 2024-05-05 ENCOUNTER — Ambulatory Visit: Admitting: Family

## 2024-05-06 NOTE — Addendum Note (Signed)
 Addended by: ORLEAN PALMA on: 05/06/2024 09:21 AM   Modules accepted: Orders

## 2024-05-10 ENCOUNTER — Ambulatory Visit
Admission: RE | Admit: 2024-05-10 | Discharge: 2024-05-10 | Disposition: A | Source: Ambulatory Visit | Attending: Family | Admitting: Family

## 2024-05-10 ENCOUNTER — Encounter: Payer: Self-pay | Admitting: Family

## 2024-05-10 ENCOUNTER — Other Ambulatory Visit

## 2024-05-10 ENCOUNTER — Ambulatory Visit
Admission: RE | Admit: 2024-05-10 | Discharge: 2024-05-10 | Disposition: A | Discharge status: Home / Self Care | Source: Ambulatory Visit | Attending: Family | Admitting: Family

## 2024-05-10 DIAGNOSIS — N6332 Unspecified lump in axillary tail of the left breast: Secondary | ICD-10-CM | POA: Diagnosis present

## 2024-05-12 NOTE — Telephone Encounter (Signed)
 Patient Karen [name] yesterday at 3:10 asking for call back about the problems she's been having  since the catheter, she states she's having sweats and upset stomach

## 2024-05-13 ENCOUNTER — Ambulatory Visit (INDEPENDENT_AMBULATORY_CARE_PROVIDER_SITE_OTHER)

## 2024-05-13 ENCOUNTER — Encounter: Payer: Self-pay | Admitting: Family

## 2024-05-13 ENCOUNTER — Ambulatory Visit
Admission: EM | Admit: 2024-05-13 | Discharge: 2024-05-13 | Disposition: A | Discharge status: Home / Self Care | Attending: Physician Assistant | Admitting: Physician Assistant

## 2024-05-13 DIAGNOSIS — M545 Low back pain, unspecified: Secondary | ICD-10-CM | POA: Insufficient documentation

## 2024-05-13 DIAGNOSIS — N390 Urinary tract infection, site not specified: Secondary | ICD-10-CM | POA: Insufficient documentation

## 2024-05-13 DIAGNOSIS — R310 Gross hematuria: Secondary | ICD-10-CM | POA: Insufficient documentation

## 2024-05-13 LAB — URINALYSIS, W/ REFLEX TO CULTURE (INFECTION SUSPECTED)
Bilirubin Urine: NEGATIVE
Glucose, UA: NEGATIVE mg/dL
Ketones, ur: NEGATIVE mg/dL
Leukocytes,Ua: NEGATIVE
Nitrite: NEGATIVE
Protein, ur: NEGATIVE mg/dL
Specific Gravity, Urine: 1.025 (ref 1.005–1.030)
pH: 5.5 (ref 5.0–8.0)

## 2024-05-13 MED ORDER — CEFDINIR 300 MG PO CAPS: 300.0000 mg | ORAL_CAPSULE | Freq: Two times a day (BID) | ORAL | 0 refills | Status: AC

## 2024-05-13 NOTE — ED Provider Notes (Signed)
 [facility]    CSN: [id number] Arrival date & time: 05/13/24  1807      History   Chief Complaint Chief Complaint  Patient presents with   Urinary Tract Infection    HPI Karen [name] is a 43 y.o. female with history of anxiety, allergies, asthma, GERD, osteoarthritis, PTSD and chronic pain.  She presents today for lower back and lower abdominal pain for the past 2 to 3 days.  Reports noticing blood in the urine. Diarrhea yesterday. Reports painful urination and frequency about a week ago that seemed to go away with OTC meds. Pain is worse when she is trying to walk.  Denies fever, fatigue, nausea/vomiting, chest pain, breathing difficulty, nausea/vomiting,  constipation, dysuria, frequency and urgency. Has taken OTC Bcs today.  Of note, patient has been prescribed oxycodone  and Suboxone in the past couple of weeks by different prescribers.  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   PTSD (post-traumatic stress disorder) 12/20/2022   Generalized anxiety disorder 10/25/2020   Mild persistent asthma 06/27/2016   Epigastric hernia 11/22/2012   Chronic, continuous use of opioids 11/22/2012   At risk for abuse of opiates 11/22/2012   Lipoma of abdominal wall 11/22/2012   Umbilical hernia without obstruction or gangrene 11/22/2012   Arthralgia of hip 06/10/2012   Chronic pain 04/16/2012   Depressive disorder 04/16/2012   Anxiety state 04/16/2012   Tension type headache 04/16/2012    Past Surgical History:  Procedure Laterality Date   CARPAL TUNNEL RELEASE Bilateral 2010   CESAREAN SECTION  2009   COLONOSCOPY     EPIGASTRIC HERNIA REPAIR N/A 09/03/2015   Procedure: HERNIA REPAIR EPIGASTRIC ADULT;   Surgeon: Reyes LELON Cota, MD;  Location: [facility];  Service: General;  Laterality: N/A;   HIP SURGERY  2013   OVARIAN CYST SURGERY  2002   [facility] [place]   TONSILLECTOMY  2010   TOTAL HIP ARTHROPLASTY Right 11/26/2023   Procedure: ARTHROPLASTY, HIP, TOTAL, ANTERIOR APPROACH;  Surgeon: Lorelle Hussar, MD;  Location: [facility];  Service: Orthopedics;  Laterality: Right;    OB History     Gravida  1   Para  1   Term      Preterm      AB      Living         SAB      IAB      Ectopic      Multiple      Live Births           Obstetric Comments  1st Menstrual Cycle:  10  1st Pregnancy:  26           Home Medications    Prior to Admission medications   Medication Sig Start Date End Date Taking? Authorizing Provider  acetaminophen  (TYLENOL ) 500 MG tablet Take 1,000 mg by mouth every 6 (six) hours as needed.   Yes [provider]  ALPRAZolam  (XANAX ) 1 MG tablet Take 1 tablet (1 mg total) by mouth 2 (two) times daily as needed for anxiety. 04/25/24 05/25/24 Yes Orlean Alan HERO, FNP  Brexpiprazole  (REXULTI ) 0.5 MG TABS Take 1 tablet (  0.5 mg total) by mouth at bedtime. 02/29/24  Yes Orlean Alan HERO, FNP  budeson-glycopyrrolate-formoterol  (BREZTRI AEROSPHERE) 160-9-4.8 MCG/ACT AERO inhaler Inhale 2 puffs into the lungs 2 (two) times daily as needed (respiratory issues.). 11/17/23  Yes Orlean Alan HERO, FNP  buprenorphine-naloxone (SUBOXONE) 8-2 mg SUBL SL tablet BUPRENORPHINE HCL-NALOXONE HCL 8-2 MG SUBL 08/16/20  Yes [provider]  BUTRANS 10 MCG/HR PTWK BUTRANS 10 MCG/HR PTWK 04/28/24  Yes [provider]  cefdinir (OMNICEF) 300 MG capsule Take 1 capsule (300 mg total) by mouth 2 (two) times daily for 7 days. 05/13/24 05/20/24 Yes Arvis Jolan NOVAK, PA-C  medroxyPROGESTERone  Acetate 150 MG/ML SUSY INJECT 1 ML (150 MG TOTAL) INTO THE MUSCLE EVERY 3 (THREE) MONTHS 03/30/24  Yes Orlean Alan HERO, FNP  vortioxetine  HBr (TRINTELLIX ) 20 MG  TABS tablet Take 1 tablet (20 mg total) by mouth daily. 11/16/23  Yes Orlean Alan HERO, FNP    Family History Family History  Problem Relation Age of Onset   COPD Mother    COPD Father    Breast cancer Maternal Aunt     Social History Social History   Tobacco Use   Smoking status: Former    Current packs/day: 0.00    Average packs/day: 0.5 packs/day for 18.0 years (9.0 ttl pk-yrs)    Types: Cigarettes    Start date: 07/21/1996    Quit date: 07/21/2014    Years since quitting: 9.8   Smokeless tobacco: Never  Vaping Use   Vaping status: Never Used  Substance Use Topics   Alcohol use: No    Alcohol/week: 0.0 standard drinks of alcohol   Drug use: No     Allergies   Benadryl [diphenhydramine hcl], Cyclobenzaprine, Ketorolac, Motrin [ibuprofen], Ketorolac tromethamine, and Tramadol   Review of Systems Review of Systems  Constitutional:  Negative for fatigue and fever.  Respiratory:  Negative for shortness of breath.   Cardiovascular:  Negative for chest pain.  Gastrointestinal:  Positive for abdominal pain. Negative for blood in stool, constipation, diarrhea, nausea and vomiting.  Genitourinary:  Positive for hematuria. Negative for difficulty urinating, dysuria, flank pain, frequency, pelvic pain, urgency and vaginal discharge.  Musculoskeletal:  Positive for back pain.  Neurological:  Negative for weakness and numbness.     Physical Exam Triage Vital Signs ED Triage Vitals  Encounter Vitals Group     BP 05/13/24 1834 105/69     Girls Systolic BP Percentile --      Girls Diastolic BP Percentile --      Boys Systolic BP Percentile --      Boys Diastolic BP Percentile --      Pulse Rate 05/13/24 1834 87     Resp --      Temp 05/13/24 1834 98.9 F (37.2 C)     Temp Source 05/13/24 1834 Oral     SpO2 05/13/24 1834 98 %     Weight 05/13/24 1833 133 lb 1.6 oz (60.4 kg)     Height --      Head Circumference --      Peak Flow --      Pain Score 05/13/24 1832 7      Pain Loc --      Pain Education --      Exclude from Growth Chart --    No data found.  Updated Vital Signs BP 105/69 (BP Location: Left Arm)   Pulse 87   Temp 98.9 F (37.2 C) (Oral)   Wt 133 lb 1.6 oz (  60.4 kg)   LMP 09/27/2016   SpO2 98%   BMI 29.84 kg/m     Physical Exam Vitals and nursing note reviewed.  Constitutional:      General: She is not in acute distress.    Appearance: Normal appearance. She is not ill-appearing or toxic-appearing.  HENT:     Head: Normocephalic and atraumatic.  Eyes:     General: No scleral icterus.       Right eye: No discharge.        Left eye: No discharge.     Conjunctiva/sclera: Conjunctivae normal.  Cardiovascular:     Rate and Rhythm: Normal rate and regular rhythm.     Heart sounds: Normal heart sounds.  Pulmonary:     Effort: Pulmonary effort is normal. No respiratory distress.     Breath sounds: Normal breath sounds.  Abdominal:     Palpations: Abdomen is soft.     Tenderness: There is no abdominal tenderness. There is no right CVA tenderness or left CVA tenderness.     Comments: Patient denies tenderness but facial expressions show mild discomfort with palpation throughout lower abdomen.  Musculoskeletal:     Cervical back: Neck supple.  Skin:    General: Skin is dry.  Neurological:     General: No focal deficit present.     Mental Status: She is alert. Mental status is at baseline.     Motor: No weakness.     Gait: Gait normal.  Psychiatric:        Mood and Affect: Mood normal.        Behavior: Behavior normal.      UC Treatments / Results  Labs (all labs ordered are listed, but only abnormal results are displayed) Labs Reviewed  URINALYSIS, W/ REFLEX TO CULTURE (INFECTION SUSPECTED) - Abnormal; Notable for the following components:      Result Value   Color, Urine AMBER (*)    APPearance HAZY (*)    Hgb urine dipstick MODERATE (*)    Bacteria, UA MANY (*)    All other components within normal limits  URINE  CULTURE    EKG   Radiology DG Abdomen 1 View Result Date: 05/13/2024 CLINICAL DATA:  Blood in urine EXAM: ABDOMEN - 1 VIEW COMPARISON:  None Available. FINDINGS: Nonobstructed gas pattern with mild to moderate stool. No radiopaque calculi. Right hip replacement. Prominent splenic shadow. IMPRESSION: 1. Nonobstructed gas pattern with mild to moderate stool. No radiopaque calculi. 2. Prominent splenic shadow, correlate with physical exam Electronically Signed   By: Luke Bun M.D.   On: 05/13/2024 19:33    Procedures Procedures (including critical care time)  Medications Ordered in UC Medications - No data to display  Initial Impression / Assessment and Plan / UC Course  I have reviewed the triage vital signs and the nursing notes.  Pertinent labs & imaging results that were available during my care of the patient were reviewed by me and considered in my medical decision making (see chart for details).   43 year old female presents for lower back pain and hematuria for the past 2 to 3 days.  No fever, painful urination, nausea/vomiting, radiating pain, numbness/tingling or weakness.  No change in BMs.  Vitals are all stable and normal and the patient is overall well-appearing.  No acute distress.  On exam patient denies abdominal tenderness.  No CVA tenderness.  Chest clear.  Heart regular rate and rhythm.  UA obtained today shows moderate hemoglobin and many bacteria.  Will  send urine for culture.  Ordered KUB to assess for possibility of kidney stone. Negative for stone. Prominent splenic shadow noted. Patient has no upper abdominal fullness or tenderness.   Suspect UTI and chronic lower back pain causing her symptoms. Will treat for UTI at this time with cefdinir. Encouraged increasing rest and fluids. Will amend treatment based on culture. Advised supportive care and continue daily meds. Discussed return and ED precautions.     Final Clinical Impressions(s) / UC Diagnoses    Final diagnoses:  Gross hematuria  Lower urinary tract infectious disease  Acute bilateral low back pain without sciatica     Discharge Instructions      UTI: Based on either symptoms or urinalysis, you may have a urinary tract infection. We will send the urine for culture and call with results in a few days. Begin antibiotics at this time. Your symptoms should be much improved over the next 2-3 days. Increase rest and fluid intake. If for some reason symptoms are worsening or not improving after a couple of days or the urine culture determines the antibiotics you are taking will not treat the infection, the antibiotics may be changed. Return or go to ER for fever, back pain, worsening urinary pain, discharge, increased blood in urine. May take Tylenol  or Motrin OTC for pain relief or consider AZO if no contraindications   BACK PAIN: Stressed avoiding painful activities . RICE (REST, ICE, COMPRESSION, ELEVATION) guidelines reviewed. May alternate ice and heat. Consider use of muscle rubs, Salonpas patches, etc. Use medications as directed including muscle relaxers if prescribed. Take anti-inflammatory medications as prescribed or OTC NSAIDs/Tylenol .  F/u with PCP in 7-10 days for reexamination, and please feel free to call or return to the urgent care at any time for any questions or concerns you may have and we will be happy to help you!   BACK PAIN RED FLAGS: If the back pain acutely worsens or there are any red flag symptoms such as numbness/tingling, leg weakness, saddle anesthesia, or loss of bowel/bladder control, go immediately to the ER. Follow up with us  as scheduled or sooner if the pain does not begin to resolve or if it worsens before the follow up       ED Prescriptions     Medication Sig Dispense Auth. Provider   cefdinir (OMNICEF) 300 MG capsule Take 1 capsule (300 mg total) by mouth 2 (two) times daily for 7 days. 14 capsule Arvis Jolan NOVAK, PA-C      PDMP not reviewed  this encounter.   Arvis Jolan NOVAK, PA-C 05/14/24 [phone]

## 2024-05-13 NOTE — Discharge Instructions (Addendum)
UTI: Based on either symptoms or urinalysis, you may have a urinary tract infection. We will send the urine for culture and call with results in a few days. Begin antibiotics at this time. Your symptoms should be much improved over the next 2-3 days. Increase rest and fluid intake. If for some reason symptoms are worsening or not improving after a couple of days or the urine culture determines the antibiotics you are taking will not treat the infection, the antibiotics may be changed. Return or go to ER for fever, back pain, worsening urinary pain, discharge, increased blood in urine. May take Tylenol or Motrin OTC for pain relief or consider AZO if no contraindications   BACK PAIN: Stressed avoiding painful activities . RICE (REST, ICE, COMPRESSION, ELEVATION) guidelines reviewed. May alternate ice and heat. Consider use of muscle rubs, Salonpas patches, etc. Use medications as directed including muscle relaxers if prescribed. Take anti-inflammatory medications as prescribed or OTC NSAIDs/Tylenol.  F/u with PCP in 7-10 days for reexamination, and please feel free to call or return to the urgent care at any time for any questions or concerns you may have and we will be happy to help you!   BACK PAIN RED FLAGS: If the back pain acutely worsens or there are any red flag symptoms such as numbness/tingling, leg weakness, saddle anesthesia, or loss of bowel/bladder control, go immediately to the ER. Follow up with us as scheduled or sooner if the pain does not begin to resolve or if it worsens before the follow up   

## 2024-05-13 NOTE — ED Triage Notes (Signed)
 Pt c/o blood in urine and low back pain x2-3days  Pt states that she has a lot of abdominal and back pain that makes it difficult to walk.

## 2024-05-14 LAB — URINE CULTURE: Culture: NO GROWTH

## 2024-05-16 ENCOUNTER — Ambulatory Visit (HOSPITAL_COMMUNITY): Payer: Self-pay

## 2024-05-17 DIAGNOSIS — M899 Disorder of bone, unspecified: Secondary | ICD-10-CM | POA: Insufficient documentation

## 2024-05-17 DIAGNOSIS — Z79899 Other long term (current) drug therapy: Secondary | ICD-10-CM | POA: Insufficient documentation

## 2024-05-17 DIAGNOSIS — Z789 Other specified health status: Secondary | ICD-10-CM | POA: Insufficient documentation

## 2024-05-17 DIAGNOSIS — F1190 Opioid use, unspecified, uncomplicated: Secondary | ICD-10-CM | POA: Insufficient documentation

## 2024-05-17 DIAGNOSIS — T50905S Adverse effect of unspecified drugs, medicaments and biological substances, sequela: Secondary | ICD-10-CM | POA: Insufficient documentation

## 2024-05-17 NOTE — Patient Instructions (Signed)
 ______________________________________________________________________    New Patients  Welcome to [facility] at [facility].   Initial Visit The first or initial visit consists of an evaluation only.   Interventional pain management.  This is an interventional pain management medical practice. This means that we offer therapies other than prescription controlled substances to manage chronic pain. These therapies may include, but are not limited to, diagnostic, therapeutic, and palliative specialized injection therapies (i.e.: Epidural Steroids, Facet Blocks, etc.). We specialize in a variety of nerve blocks as well as radiofrequency treatments. We offer pain implant evaluations and trials, as well as follow up management. In addition we also provide a variety joint injections, including Viscosupplementation (AKA: Gel Therapy).  Prescription Pain Medication. We specialize in alternatives to opioids.  Although we can provide evaluations and recommendations for/of pharmacologic therapies based on CDC Guidelines, we no longer take patients for long-term medication management. Please be clear that we will not be taking over the prescribing of your pain medications.  ______________________________________________________________________      ______________________________________________________________________    Patient Information update  To: All of our patients.  Re: Name change.  It has been made official that our current name, "[facility]"   will soon be changed to "[facility]".   The purpose of this change is to eliminate any confusion created by the concept of our practice being a "Medication Management Pain Clinic". In the past this has led to the misconception that we treat pain primarily by the use of prescription  medications.  Nothing can be farther from the truth.   Understanding PAIN MANAGEMENT: To further understand what our practice does, you first have to understand that "Pain Management" is a subspecialty that requires additional training once a physician has completed their specialty training, which can be in either Anesthesia, Neurology, Psychiatry, or Physical Medicine and Rehabilitation (PMR). Each one of these contributes to the final approach taken by each physician to the management of their patient's pain. To be a "Pain Management Specialist" you must have first completed one of the specialty trainings below.  Anesthesiologists - trained in clinical pharmacology and interventional techniques such as nerve blockade and regional as well as central neuroanatomy. They are trained to block pain before, during, and after surgical interventions.  Neurologists - trained in the diagnosis and pharmacological treatment of complex neurological conditions, such as Multiple Sclerosis, Parkinson's, spinal cord injuries, and other systemic conditions that may be associated with symptoms that may include but are not limited to pain. They tend to rely primarily on the treatment of chronic pain using prescription medications.  Psychiatrist - trained in conditions affecting the psychosocial wellbeing of patients including but not limited to depression, anxiety, schizophrenia, personality disorders, addiction, and other substance use disorders that may be associated with chronic pain. They tend to rely primarily on the treatment of chronic pain using prescription medications.   Physical Medicine and Rehabilitation (PMR) physicians, also known as physiatrists - trained to treat a wide variety of medical conditions affecting the brain, spinal cord, nerves, bones, joints, ligaments, muscles, and tendons. Their training is primarily aimed at treating patients that have suffered injuries that have caused severe physical  impairment. Their training is primarily aimed at the physical therapy and rehabilitation of those patients. They may also work alongside orthopedic surgeons or neurosurgeons using their expertise in assisting surgical patients to recover after their surgeries.  INTERVENTIONAL PAIN MANAGEMENT is  sub-subspecialty of Pain Management.  Our physicians are Board-certified in Anesthesia, Pain Management, and Interventional Pain Management.  This meaning that not only have they been trained and Board-certified in their specialty of Anesthesia, and subspecialty of Pain Management, but they have also received further training in the sub-subspecialty of Interventional Pain Management, in order to become Board-certified as INTERVENTIONAL PAIN MANAGEMENT SPECIALIST.    Mission: Our goal is to use our skills in  INTERVENTIONAL PAIN MANAGEMENT as alternatives to the chronic use of prescription opioid medications for the treatment of pain. To make this more clear, we have changed our name to reflect what we do and offer. We will continue to offer medication management assessment and recommendations, but we will not be taking over any patient's medication management.  ______________________________________________________________________

## 2024-05-17 NOTE — Progress Notes (Unsigned)
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 anesthetic[LA] +/- sedation  L.A.Local Anesthetic  Steroid benefit  Ongoing benefit)   Provider-requested follow-up: No follow-ups on file.  Future Appointments  Date Time Provider Department Center  05/18/2024  9:00 AM Tanya Glisson, MD [facility] None  05/26/2024  9:15 AM Orlean Alan HERO, FNP AMA-AMA None   I discussed the assessment and treatment plan with the patient. The patient was provided an opportunity to ask questions and all were answered. The patient agreed with the plan and demonstrated an understanding of the instructions.  Patient advised to call back or seek an in-person evaluation if the symptoms or condition worsens.  Duration of  encounter: *** minutes.  Total time on encounter, as per AMA guidelines included both the face-to-face and non-face-to-face time personally spent by the physician and/or other qualified health care professional(s) on the day of the encounter (includes time in activities that require the physician or other qualified health care professional and does not include time in activities normally performed by clinical staff). Physician's time may include the following activities when performed: Preparing to see the patient (e.g., pre-charting review of records, searching for previously ordered imaging, lab work, and nerve conduction tests) Review of prior analgesic  pharmacotherapies. Reviewing PMP Interpreting ordered tests (e.g., lab work, imaging, nerve conduction tests) Performing post-procedure evaluations, including interpretation of diagnostic procedures Obtaining and/or reviewing separately obtained history Performing a medically appropriate examination and/or evaluation Counseling and educating the patient/family/caregiver Ordering medications, tests, or procedures Referring and communicating with other health care professionals (when not separately reported) Documenting clinical information in the electronic or other health record Independently interpreting results (not separately reported) and communicating results to the patient/ family/caregiver Care coordination (not separately reported)  Note by: Eric DELENA Como, MD (TTS and AI technology used. I apologize for any typographical errors that were not detected and corrected.) Date: 05/18/2024; Time: 12:49 PM

## 2024-05-18 ENCOUNTER — Ambulatory Visit (HOSPITAL_BASED_OUTPATIENT_CLINIC_OR_DEPARTMENT_OTHER): Admitting: Pain Medicine

## 2024-05-18 DIAGNOSIS — G894 Chronic pain syndrome: Secondary | ICD-10-CM

## 2024-05-18 DIAGNOSIS — Z9189 Other specified personal risk factors, not elsewhere classified: Secondary | ICD-10-CM

## 2024-05-18 DIAGNOSIS — F1120 Opioid dependence, uncomplicated: Secondary | ICD-10-CM

## 2024-05-18 DIAGNOSIS — Z789 Other specified health status: Secondary | ICD-10-CM

## 2024-05-18 DIAGNOSIS — G8929 Other chronic pain: Secondary | ICD-10-CM

## 2024-05-18 DIAGNOSIS — Z91199 Patient's noncompliance with other medical treatment and regimen due to unspecified reason: Secondary | ICD-10-CM

## 2024-05-18 DIAGNOSIS — T50905S Adverse effect of unspecified drugs, medicaments and biological substances, sequela: Secondary | ICD-10-CM

## 2024-05-18 DIAGNOSIS — M899 Disorder of bone, unspecified: Secondary | ICD-10-CM

## 2024-05-18 DIAGNOSIS — Z79899 Other long term (current) drug therapy: Secondary | ICD-10-CM

## 2024-05-18 DIAGNOSIS — F1190 Opioid use, unspecified, uncomplicated: Secondary | ICD-10-CM

## 2024-05-19 ENCOUNTER — Other Ambulatory Visit: Payer: Self-pay | Admitting: Family

## 2024-05-19 MED ORDER — ALPRAZOLAM 1 MG PO TABS: 1.0000 mg | ORAL_TABLET | Freq: Two times a day (BID) | ORAL | 1 refills | Status: DC | PRN

## 2024-05-20 NOTE — Telephone Encounter (Signed)
 See other message

## 2024-05-25 ENCOUNTER — Emergency Department

## 2024-05-25 ENCOUNTER — Observation Stay
Admission: EM | Admit: 2024-05-25 | Discharge: 2024-05-26 | Discharge status: Against Medical Advice | Attending: Internal Medicine | Admitting: Internal Medicine

## 2024-05-25 ENCOUNTER — Other Ambulatory Visit: Payer: Self-pay

## 2024-05-25 DIAGNOSIS — F1120 Opioid dependence, uncomplicated: Secondary | ICD-10-CM | POA: Diagnosis present

## 2024-05-25 DIAGNOSIS — G894 Chronic pain syndrome: Secondary | ICD-10-CM | POA: Diagnosis not present

## 2024-05-25 DIAGNOSIS — F1220 Cannabis dependence, uncomplicated: Secondary | ICD-10-CM | POA: Insufficient documentation

## 2024-05-25 DIAGNOSIS — R112 Nausea with vomiting, unspecified: Secondary | ICD-10-CM | POA: Diagnosis not present

## 2024-05-25 DIAGNOSIS — J4520 Mild intermittent asthma, uncomplicated: Secondary | ICD-10-CM | POA: Diagnosis not present

## 2024-05-25 DIAGNOSIS — F4310 Post-traumatic stress disorder, unspecified: Secondary | ICD-10-CM | POA: Diagnosis not present

## 2024-05-25 DIAGNOSIS — F329 Major depressive disorder, single episode, unspecified: Secondary | ICD-10-CM | POA: Insufficient documentation

## 2024-05-25 DIAGNOSIS — Z79899 Other long term (current) drug therapy: Secondary | ICD-10-CM | POA: Insufficient documentation

## 2024-05-25 DIAGNOSIS — J189 Pneumonia, unspecified organism: Principal | ICD-10-CM | POA: Diagnosis present

## 2024-05-25 DIAGNOSIS — K922 Gastrointestinal hemorrhage, unspecified: Secondary | ICD-10-CM

## 2024-05-25 DIAGNOSIS — F322 Major depressive disorder, single episode, severe without psychotic features: Secondary | ICD-10-CM | POA: Diagnosis present

## 2024-05-25 LAB — COMPREHENSIVE METABOLIC PANEL WITH GFR
ALT: 13 U/L (ref 0–44)
AST: 20 U/L (ref 15–41)
Albumin: 3.6 g/dL (ref 3.5–5.0)
Alkaline Phosphatase: 72 U/L (ref 38–126)
Anion gap: 13 (ref 5–15)
BUN: 15 mg/dL (ref 6–20)
CO2: 21 mmol/L — ABNORMAL LOW (ref 22–32)
Calcium: 8.4 mg/dL — ABNORMAL LOW (ref 8.9–10.3)
Chloride: 107 mmol/L (ref 98–111)
Creatinine, Ser: 0.81 mg/dL (ref 0.44–1.00)
GFR, Estimated: >60 mL/min (ref >60)
Glucose, Bld: 142 mg/dL — ABNORMAL HIGH (ref 70–99)
Potassium: 3.9 mmol/L (ref 3.5–5.1)
Sodium: 141 mmol/L (ref 135–145)
Total Bilirubin: 0.4 mg/dL (ref 0.0–1.2)
Total Protein: 7 g/dL (ref 6.5–8.1)

## 2024-05-25 LAB — URINALYSIS, ROUTINE W REFLEX MICROSCOPIC
Bilirubin Urine: NEGATIVE
Glucose, UA: NEGATIVE mg/dL
Hgb urine dipstick: NEGATIVE
Ketones, ur: NEGATIVE mg/dL
Leukocytes,Ua: NEGATIVE
Nitrite: NEGATIVE
Protein, ur: NEGATIVE mg/dL
Specific Gravity, Urine: 1.012 (ref 1.005–1.030)
pH: 5 (ref 5.0–8.0)

## 2024-05-25 LAB — URINE DRUG SCREEN, QUALITATIVE (ARMC ONLY)
Amphetamines, Ur Screen: NOT DETECTED
Barbiturates, Ur Screen: NOT DETECTED
Benzodiazepine, Ur Scrn: POSITIVE — AB
Cannabinoid 50 Ng, Ur ~~LOC~~: POSITIVE — AB
Cocaine Metabolite,Ur ~~LOC~~: NOT DETECTED
MDMA (Ecstasy)Ur Screen: NOT DETECTED
Methadone Scn, Ur: NOT DETECTED
Opiate, Ur Screen: NOT DETECTED
Phencyclidine (PCP) Ur S: NOT DETECTED
Tricyclic, Ur Screen: NOT DETECTED

## 2024-05-25 LAB — BLOOD GAS, VENOUS
Acid-base deficit: 0.4 mmol/L (ref 0.0–2.0)
Bicarbonate: 25.9 mmol/L (ref 20.0–28.0)
O2 Saturation: 56.9 %
Patient temperature: 37
pCO2, Ven: 48 mmHg (ref 44–60)
pH, Ven: 7.34 (ref 7.25–7.43)
pO2, Ven: 36 mmHg (ref 32–45)

## 2024-05-25 LAB — MAGNESIUM: Magnesium: 1.7 mg/dL (ref 1.7–2.4)

## 2024-05-25 LAB — CBC
HCT: 29.9 % — ABNORMAL LOW (ref 36.0–46.0)
Hemoglobin: 8.8 g/dL — ABNORMAL LOW (ref 12.0–15.0)
MCH: 22.1 pg — ABNORMAL LOW (ref 26.0–34.0)
MCHC: 29.4 g/dL — ABNORMAL LOW (ref 30.0–36.0)
MCV: 74.9 fL — ABNORMAL LOW (ref 80.0–100.0)
Platelets: 287 K/uL (ref 150–400)
RBC: 3.99 MIL/uL (ref 3.87–5.11)
RDW: 16.8 % — ABNORMAL HIGH (ref 11.5–15.5)
WBC: 7.1 K/uL (ref 4.0–10.5)
nRBC: 0 % (ref 0.0–0.2)

## 2024-05-25 LAB — RESP PANEL BY RT-PCR (RSV, FLU A&B, COVID)  RVPGX2
Influenza A by PCR: NEGATIVE
Influenza B by PCR: NEGATIVE
Resp Syncytial Virus by PCR: NEGATIVE
SARS Coronavirus 2 by RT PCR: NEGATIVE

## 2024-05-25 LAB — LACTIC ACID, PLASMA
Lactic Acid, Venous: 2.3 mmol/L (ref 0.5–1.9)
Lactic Acid, Venous: 2.2 mmol/L (ref 0.5–1.9)

## 2024-05-25 LAB — ETHANOL: Alcohol, Ethyl (B): <15 mg/dL (ref <15)

## 2024-05-25 LAB — TROPONIN I (HIGH SENSITIVITY): Troponin I (High Sensitivity): 4 ng/L (ref <18)

## 2024-05-25 LAB — PROCALCITONIN: Procalcitonin: <0.1 ng/mL

## 2024-05-25 LAB — ACETAMINOPHEN LEVEL: Acetaminophen (Tylenol), Serum: <10 ug/mL — ABNORMAL LOW (ref 10–30)

## 2024-05-25 LAB — HIV ANTIBODY (ROUTINE TESTING W REFLEX): HIV Screen 4th Generation wRfx: NONREACTIVE

## 2024-05-25 LAB — BRAIN NATRIURETIC PEPTIDE: B Natriuretic Peptide: 63.9 pg/mL (ref 0.0–100.0)

## 2024-05-25 LAB — LIPASE, BLOOD: Lipase: 27 U/L (ref 11–51)

## 2024-05-25 LAB — SALICYLATE LEVEL: Salicylate Lvl: <7 mg/dL — ABNORMAL LOW (ref 7.0–30.0)

## 2024-05-25 MED ORDER — LACTATED RINGERS IV SOLN: INTRAVENOUS | Status: AC

## 2024-05-25 MED ORDER — ACETAMINOPHEN 650 MG RE SUPP: 650.0000 mg | Freq: Four times a day (QID) | RECTAL | Status: DC | PRN

## 2024-05-25 MED ORDER — SODIUM CHLORIDE 0.9 % IV BOLUS
1000.0000 mL | Freq: Once | INTRAVENOUS | Status: AC
  Administered 2024-05-25: 1000 mL via INTRAVENOUS

## 2024-05-25 MED ORDER — ONDANSETRON HCL 4 MG/2ML IJ SOLN: 4.0000 mg | Freq: Four times a day (QID) | INTRAMUSCULAR | Status: DC | PRN

## 2024-05-25 MED ORDER — SODIUM CHLORIDE 0.9% FLUSH
3.0000 mL | Freq: Two times a day (BID) | INTRAVENOUS | Status: DC
  Administered 2024-05-25 (×2): 3 mL via INTRAVENOUS

## 2024-05-25 MED ORDER — ACETAMINOPHEN 325 MG PO TABS
650.0000 mg | ORAL_TABLET | Freq: Four times a day (QID) | ORAL | Status: DC | PRN
  Administered 2024-05-26: 650 mg via ORAL
  Filled 2024-05-25: qty 2

## 2024-05-25 MED ORDER — IPRATROPIUM-ALBUTEROL 0.5-2.5 (3) MG/3ML IN SOLN
3.0000 mL | Freq: Four times a day (QID) | RESPIRATORY_TRACT | Status: DC | PRN
  Administered 2024-05-25: 3 mL via RESPIRATORY_TRACT
  Filled 2024-05-25: qty 3

## 2024-05-25 MED ORDER — SODIUM CHLORIDE 0.9 % IV SOLN
2.0000 g | Freq: Once | INTRAVENOUS | Status: AC
  Administered 2024-05-25: 2 g via INTRAVENOUS
  Filled 2024-05-25: qty 20

## 2024-05-25 MED ORDER — SODIUM CHLORIDE 0.9 % IV SOLN
500.0000 mg | Freq: Once | INTRAVENOUS | Status: AC
  Administered 2024-05-25: 500 mg via INTRAVENOUS
  Filled 2024-05-25: qty 5

## 2024-05-25 MED ORDER — ONDANSETRON HCL 4 MG/2ML IJ SOLN
4.0000 mg | Freq: Once | INTRAMUSCULAR | Status: AC
  Administered 2024-05-25: 4 mg via INTRAVENOUS
  Filled 2024-05-25: qty 2

## 2024-05-25 MED ORDER — ONDANSETRON HCL 4 MG PO TABS
4.0000 mg | ORAL_TABLET | Freq: Four times a day (QID) | ORAL | Status: DC | PRN
Start: 2024-05-25 — End: 2024-05-26

## 2024-05-25 MED ORDER — SENNOSIDES-DOCUSATE SODIUM 8.6-50 MG PO TABS: 1.0000 | ORAL_TABLET | Freq: Every evening | ORAL | Status: DC | PRN

## 2024-05-25 MED ORDER — ENOXAPARIN SODIUM 40 MG/0.4ML IJ SOSY
40.0000 mg | PREFILLED_SYRINGE | INTRAMUSCULAR | Status: DC
  Administered 2024-05-25: 40 mg via SUBCUTANEOUS
  Filled 2024-05-25: qty 0.4

## 2024-05-25 NOTE — ED Triage Notes (Signed)
 Pt comes in via [facility] from home with complaints of emesis. According to EMS, the patient has been under a lot of stress at home, and recently loss her father on 10/27. Pt  reportedly took her saboxone and muscle relaxer together and has been vomiting ever since. Pt also states that she took her alprazolam  as well. Pt is a poor historian.

## 2024-05-25 NOTE — Progress Notes (Signed)
   05/25/24 1713  Assess: MEWS Score  Temp 98.7 F (37.1 C)  BP 112/60  MAP (mmHg) 72  Pulse Rate (!) 108  Resp (!) 24  Level of Consciousness Alert  SpO2 97 %  O2 Device Nasal Cannula  O2 Flow Rate (L/min) 2 L/min  Assess: MEWS Score  MEWS Temp 0  MEWS Systolic 0  MEWS Pulse 1  MEWS RR 1  MEWS LOC 0  MEWS Score 2  MEWS Score Color Yellow  Assess: if the MEWS score is Yellow or Red  Were vital signs accurate and taken at a resting state? Yes  Does the patient meet 2 or more of the SIRS criteria? Yes  Does the patient have a confirmed or suspected source of infection? Yes  MEWS guidelines implemented  Yes, yellow  Treat  MEWS Interventions Considered administering scheduled or prn medications/treatments as ordered  Take Vital Signs  Increase Vital Sign Frequency  Yellow: Q2hr x1, continue Q4hrs until patient remains green for 12hrs  Escalate  MEWS: Escalate Yellow: Discuss with charge nurse and consider notifying provider and/or RRT  Notify: Charge Nurse/RN  Name of Charge Nurse/RN Notified Andrill RN  Provider Notification  Provider Name/Title Morene Bathe, MD  Date Provider Notified 05/25/24  Time Provider Notified 1724  Method of Notification Page (secure chat)  Notification Reason Change in status  Provider response At bedside;See new orders  Date of Provider Response 05/25/24  Time of Provider Response 1740  Assess: SIRS CRITERIA  SIRS Temperature  0  SIRS Respirations  1  SIRS Pulse 1  SIRS WBC 0  SIRS Score Sum  2   Patient to room 142 from ED, patient AxOx4 but drowsy at times, able to ambulate to bathroom to void and have bowel movement. Patient with tachypnea and audible wheezing upon assessment asking for her xanax . Dr Bathe to bedside to assess, PRN duonebs and ABG ordered. RN notified Respiratory for neb treatment and ABG.

## 2024-05-25 NOTE — Plan of Care (Signed)
  Problem: Clinical Measurements: Goal: Ability to maintain clinical measurements within normal limits will improve Outcome: Not Progressing   Problem: Clinical Measurements: Goal: Will remain free from infection Outcome: Not Progressing   Problem: Clinical Measurements: Goal: Respiratory complications will improve Outcome: Not Progressing   Problem: Activity: Goal: Risk for activity intolerance will decrease Outcome: Not Progressing   Problem: Coping: Goal: Level of anxiety will decrease Outcome: Not Progressing

## 2024-05-25 NOTE — H&P (Signed)
 History and Physical    Karen [name] FMW:[id number] DOB: 09-01-1980 DOA: 05/25/2024  DOS: the patient was seen and examined on 05/25/2024  PCP: Orlean Alan HERO, FNP   Patient coming from: Home  I have personally briefly reviewed patient's old medical records in [facility] and CareEverywhere  HPI:   Karen [name] is a 43 y.o. year old female with past medical history of MDD, GAD, PTSD and opioid use disorder presenting to the ED with nausea and vomiting.   Pt states she has been having nausea and vomiting for the last few days.  She denies any fevers or chills.  But states that she hurts all over.  Is not able to specify 1 part of the body but states she is having pain everywhere.  Denies any other concerns but does request her home Xanax .   On arrival to the ED patient was noted to be HDS stable.  Lab work and imaging obtained.  CBC without leukocytosis, mild anemia that is microcytic.  CMP with non-anion gap metabolic acidosis and slight hyperglycemia otherwise unremarkable.  Negative alcohol level, negative acetaminophen  level and salicylate level.  BNP and troponin normal.  Respiratory panel negative.  Chest x-ray showed concern for multifocal pneumonia and procalcitonin was obtained by EDP given that she was satting well on room air and now was normal.  Blood cultures obtained and patient was started on IV antibiotics by EDP.  Given multifocal pneumonia and intractable nausea and vomiting, TRH contacted for admission.  Review of Systems: As mentioned in the history of present illness. All other systems reviewed and are negative.   Past Medical History:  Diagnosis Date   Allergy    Anxiety    Asthma    GERD (gastroesophageal reflux disease)    Internal hemorrhoids    Pneumonia    Primary osteoarthritis of right hip     Past Surgical History:  Procedure Laterality Date   CARPAL TUNNEL RELEASE Bilateral 2010   CESAREAN SECTION  2009   COLONOSCOPY      EPIGASTRIC HERNIA REPAIR N/A 09/03/2015   Procedure: HERNIA REPAIR EPIGASTRIC ADULT;  Surgeon: Reyes LELON Cota, MD;  Location: [facility];  Service: General;  Laterality: N/A;   HIP SURGERY  2013   OVARIAN CYST [facility]GERY  2002   [facility] [place]   TONSILLECTOMY  2010   TOTAL HIP ARTHROPLASTY Right 11/26/2023   Procedure: ARTHROPLASTY, HIP, TOTAL, ANTERIOR APPROACH;  Surgeon: Lorelle Hussar, MD;  Location: [facility] OR[facility];  Service: Orthopedics;  Laterality: Right;     Allergies  Allergen Reactions   Benadryl [Diphenhydramine Hcl] Other (See Comments)    Leg jumpy   Cyclobenzaprine Other (See Comments)     legs jumpy  Restless legs   Ketorolac Hives   Motrin [Ibuprofen] Hives   Ketorolac Tromethamine Rash   Tramadol Rash and Hives    Family History  Problem Relation Age of Onset   COPD Mother    COPD Father    Breast cancer Maternal Aunt     Prior to Admission medications   Medication Sig Start Date End Date Taking? Authorizing Provider  acetaminophen  (TYLENOL ) 500 MG tablet Take 1,000 mg by mouth every 6 (six) hours as needed.    [provider]  ALPRAZolam  (XANAX ) 1 MG tablet Take 1 tablet (1 mg total) by mouth 2 (two) times daily as needed for anxiety. 05/19/24 07/18/24  Orlean Alan HERO, FNP  Brexpiprazole  (REXULTI ) 0.5 MG TABS Take 1 tablet (0.5 mg total)  by mouth at bedtime. 02/29/24   Orlean Alan HERO, FNP  budeson-glycopyrrolate-formoterol  (BREZTRI AEROSPHERE) 160-9-4.8 MCG/ACT AERO inhaler Inhale 2 puffs into the lungs 2 (two) times daily as needed (respiratory issues.). 11/17/23   Orlean Alan HERO, FNP  buprenorphine-naloxone (SUBOXONE) 8-2 mg SUBL SL tablet BUPRENORPHINE HCL-NALOXONE HCL 8-2 MG SUBL 08/16/20   [provider]  BUTRANS 10 MCG/HR PTWK BUTRANS 10 MCG/HR PTWK 04/28/24   [provider]  medroxyPROGESTERone  Acetate 150 MG/ML SUSY INJECT 1 ML (150 MG TOTAL) INTO THE MUSCLE EVERY 3 (THREE) MONTHS 03/30/24   Orlean Alan HERO, FNP   methocarbamol  (ROBAXIN ) 500 MG tablet Take 500 mg by mouth 3 (three) times daily as needed.    [provider]  vortioxetine  HBr (TRINTELLIX ) 20 MG TABS tablet Take 1 tablet (20 mg total) by mouth daily. 11/16/23   Orlean Alan HERO, FNP    Social History:  reports that she quit smoking about 9 years ago. Her smoking use included cigarettes. She started smoking about 27 years ago. She has a 9 pack-year smoking history. She has never used smokeless tobacco. She reports that she does not drink alcohol and does not use drugs. Lives with husband Tobacco- Denies use. EtOH- Denies use.  Illicit drug use- denies use.  IADLs/ADLs- can perform independently at baseline    Physical Exam: Vitals:   05/25/24 1713 05/25/24 1812 05/25/24 1822 05/25/24 1916  BP: 112/60   101/73  Pulse: (!) 108   89  Resp: (!) 24   (!) 24  Temp: 98.7 F (37.1 C)   98.5 F (36.9 C)  TempSrc: Oral     SpO2: 97% 97%  95%  Weight:      Height:   5' (1.524 m)      Physical Exam General: NAD HENT: NCAT Lungs: CTAB, no wheeze, rhonchi or rales.  Cardiovascular: Normal heart sounds, no r/m/g, 2+ pulses in all extremities. No LE edema Abdomen: No TTP, normal bowel sounds MSK: No asymmetry or muscle atrophy.  Skin: no lesions noted on exposed skin Neuro: Somnolent but awakens to voice, oriented x4. CN grossly intact Psych: Normal mood and normal affect   Labs on Admission: I have personally reviewed following labs and imaging studies  CBC: Recent Labs  Lab 05/25/24 0928  WBC 7.1  HGB 8.8*  HCT 29.9*  MCV 74.9*  PLT 287   Basic Metabolic Panel: Recent Labs  Lab 05/25/24 0928  NA 141  K 3.9  CL 107  CO2 21*  GLUCOSE 142*  BUN 15  CREATININE 0.81  CALCIUM 8.4*  MG 1.7   GFR: Estimated Creatinine Clearance: 75.3 mL/min (by C-G formula based on SCr of 0.81 mg/dL). Liver Function Tests: Recent Labs  Lab 05/25/24 0928  AST 20  ALT 13  ALKPHOS 72  BILITOT 0.4  PROT 7.0  ALBUMIN  3.6   Recent Labs  Lab 05/25/24 0928  LIPASE 27   No results for input(s): AMMONIA in the last 168 hours. Coagulation Profile: No results for input(s): INR, PROTIME in the last 168 hours. Cardiac Enzymes: Recent Labs  Lab 05/25/24 0928  TROPONINIHS 4   BNP (last 3 results) Recent Labs    05/25/24 0928  BNP 63.9   HbA1C: No results for input(s): HGBA1C in the last 72 hours. CBG: No results for input(s): GLUCAP in the last 168 hours. Lipid Profile: No results for input(s): CHOL, HDL, LDLCALC, TRIG, CHOLHDL, LDLDIRECT in the last 72 hours. Thyroid Function Tests: No results for input(s): TSH,  T4TOTAL, FREET4, T3FREE, THYROIDAB in the last 72 hours. Anemia Panel: No results for input(s): VITAMINB12, FOLATE, FERRITIN, TIBC, IRON, RETICCTPCT in the last 72 hours. Urine analysis:    Component Value Date/Time   COLORURINE STRAW (A) 05/25/2024 1133   APPEARANCEUR CLEAR (A) 05/25/2024 1133   APPEARANCEUR Hazy 04/07/2014 1000   LABSPEC 1.012 05/25/2024 1133   LABSPEC 1.011 04/07/2014 1000   PHURINE 5.0 05/25/2024 1133   GLUCOSEU NEGATIVE 05/25/2024 1133   GLUCOSEU Negative 04/07/2014 1000   HGBUR NEGATIVE 05/25/2024 1133   BILIRUBINUR NEGATIVE 05/25/2024 1133   BILIRUBINUR Negative 04/07/2014 1000   KETONESUR NEGATIVE 05/25/2024 1133   PROTEINUR NEGATIVE 05/25/2024 1133   UROBILINOGEN 0.2 01/06/2010 2111   NITRITE NEGATIVE 05/25/2024 1133   LEUKOCYTESUR NEGATIVE 05/25/2024 1133   LEUKOCYTESUR Negative 04/07/2014 1000    Radiological Exams on Admission: I have personally reviewed images CT CHEST WO CONTRAST Result Date: 05/25/2024 EXAM: CT CHEST WITHOUT CONTRAST 05/25/2024 02:58:00 PM TECHNIQUE: CT of the chest was performed without the administration of intravenous contrast. Multiplanar reformatted images are provided for review. Automated exposure control, iterative reconstruction, and/or weight based adjustment of the mA/kV  was utilized to reduce the radiation dose to as low as reasonably achievable. COMPARISON: Previous exams are available. CLINICAL HISTORY: Respiratory illness, nondiagnostic xray. FINDINGS: MEDIASTINUM: Heart and pericardium are unremarkable. The central airways are clear. LYMPH NODES: No mediastinal, hilar or axillary lymphadenopathy. LUNGS AND PLEURA: Multiple patchy airspace opacities are noted in both lungs, left greater than right, most consistent with multifocal pneumonia. No pleural effusion or pneumothorax. SOFT TISSUES/BONES: No acute abnormality of the bones or soft tissues. UPPER ABDOMEN: Limited images of the upper abdomen demonstrates no acute abnormality. IMPRESSION: 1. Multiple patchy bilateral airspace opacities, left greater than right, consistent with multifocal pneumonia. Electronically signed by: Lynwood Seip MD 05/25/2024 03:15 PM EST RP Workstation: [id number]   DG Chest Portable 1 View Result Date: 05/25/2024 CLINICAL DATA:  Cough and emesis. EXAM: PORTABLE CHEST 1 VIEW COMPARISON:  09/27/2016 and CT chest 12/21/2015. FINDINGS: Trachea is midline. Heart is enlarged. Bilateral mixed interstitial and airspace opacification. Probable small left pleural effusion. IMPRESSION: Congestive heart failure.  Difficult to exclude pneumonia. Electronically Signed   By: Newell Eke M.D.   On: 05/25/2024 11:38    EKG: My personal interpretation of EKG shows: Sinus tachycardia without any acute ST changes    Assessment/Plan Principal Problem:   Intractable nausea and vomiting Active Problems:   Mild intermittent asthma   Chronic pain syndrome   PTSD (post-traumatic stress disorder)   Major depressive disorder, single episode, severe (HCC)   Opioid dependence, uncomplicated (HCC)   Patient with intractable nausea and vomiting that appears to have improved.  Patient still not tolerating p.o.  It appears her vomiting has led to pneumonitis chest x-ray with chest x-ray showing concern for  multifocal pneumonia but patient without signs and symptoms of it (see below).  Will place on her on full liquid diet to ensure she does not have further nausea and vomiting.  Multifocal pneumonia? Noted on imaging.  EDP started CAP coverage.  Patient without any oxygen requirement.  She was noted to be somnolent by me later during my evaluation and ordered a VBG that was completely normal.  Will continue to monitor her and if she develops fever or oxygen requirement will continue CAP coverage.  Will add procalcitonin for tomorrow which will help guide our antibiotic decision making.  MDD/PTSD: Given patient was noted to be somnolent, will hold her  Xanax .  Chronic pain syndrome: She is on Suboxone for opioid use disorder but denies taking that medicine.  She is also on Robaxin  which she denies as well.  Both have recent dispenses.  VTE prophylaxis:  Lovenox   Diet: Full liquids Code Status:  Full Code Telemetry:  Admission status: Observation, Med-Surg Patient is from: Home Anticipated d/c is to: Home Anticipated d/c is in: 1-2 days   Family Communication: Updated at bedside  Consults called: None   Severity of Illness: The appropriate patient status for this patient is OBSERVATION. Observation status is judged to be reasonable and necessary in order to provide the required intensity of service to ensure the patient's safety. The patient's presenting symptoms, physical exam findings, and initial radiographic and laboratory data in the context of their medical condition is felt to place them at decreased risk for further clinical deterioration. Furthermore, it is anticipated that the patient will be medically stable for discharge from the hospital within 2 midnights of admission.    Morene Bathe, MD [facility]

## 2024-05-25 NOTE — ED Provider Notes (Signed)
 [facility] Provider Note    Event Date/Time   First MD Initiated Contact with Patient 05/25/24 0932     (approximate)  History   Chief Complaint: Emesis  HPI  Karen [name] is a 43 y.o. female with a past med history of anxiety, asthma, gastric reflux, presents to the emergency department for nausea vomiting.  According to EMS report patient had potentially taken Suboxone and a muscle relaxer and since then has been having nausea and vomiting overnight.  States she took her prescribed alprazolam  this morning.  Here the patient denies taking a muscle relaxer or Suboxone.  States she took her normal Xanax  last night and this morning.  Denies taking any additional medications or any significant amount of medication.  Denies any SI or HI.  Patient does have somewhat slowed/slurred responses however is otherwise able to give a good history.  States her father passed away approximately 10 days ago and she has been under a significant amount of stress.  Patient lives with her daughter and husband, awaiting family arrival for further history.  Does not appear to meet IVC criteria currently.  Physical Exam   Triage Vital Signs: ED Triage Vitals  Encounter Vitals Group     BP 05/25/24 0926 133/75     Girls Systolic BP Percentile --      Girls Diastolic BP Percentile --      Boys Systolic BP Percentile --      Boys Diastolic BP Percentile --      Pulse Rate 05/25/24 0926 (!) 119     Resp 05/25/24 0926 (!) 25     Temp 05/25/24 0926 98.8 F (37.1 C)     Temp src --      SpO2 05/25/24 0926 100 %     Weight 05/25/24 0927 146 lb 1.6 oz (66.3 kg)     Height --      Head Circumference --      Peak Flow --      Pain Score 05/25/24 0927 0     Pain Loc --      Pain Education --      Exclude from Growth Chart --     Most recent vital signs: Vitals:   05/25/24 0926  BP: 133/75  Pulse: (!) 119  Resp: (!) 25  Temp: 98.8 F (37.1 C)  SpO2: 100%     General: Awake, no distress.  Somewhat slowed/slurred responses. CV:  Good peripheral perfusion.  Regular rate and rhythm  Resp:  Normal effort.  Equal breath sounds bilaterally.  Abd:  No distention.  Soft, nontender.  No rebound or guarding.  ED Results / Procedures / Treatments   EKG  EKG viewed and interpreted by myself shows sinus tachycardia 121 bpm with a narrow QRS, normal axis, normal intervals, no concerning ST changes.  I have reviewed and interpreted the chest x-ray images.  Patient has hypoventilation but does appear to have somewhat diffuse opacities bilaterally. Radiology has read the x-ray as interstitial airspace opacification and small left pleural effusion concerning for congestive heart failure but cannot exclude pneumonia.  MEDICATIONS ORDERED IN ED: Medications  sodium chloride  0.9 % bolus 1,000 mL (has no administration in time range)  ondansetron  (ZOFRAN ) injection 4 mg (has no administration in time range)     IMPRESSION / MDM / ASSESSMENT AND PLAN / ED COURSE  I reviewed the triage vital signs and the nursing notes.  Patient's presentation is most consistent with acute presentation  with potential threat to life or bodily function.  Patient presents to the emergency department for nausea vomiting.  Here patient is somnolent but awakens to voice will answer questions appropriately.  EMS reports patient took Suboxone a muscle relaxer and alprazolam , patient adamantly denies this states she only took her prescribed amount of alprazolam .  Denies alcohol use denies any other substances.  Patient states she stopped using Suboxone recently.  We will check labs including CBC chemistry we will obtain ethanol acetaminophen  and salicylate levels in addition to urine drug screen.  Will IV hydrate and treat the patient's nausea with Zofran .  Will reassess once lab results are known.  Awaiting family arrival for further history.  Patient's lab work has anemia with a  hemoglobin of 8.8, otherwise reassuring CBC.  Patient's chemistry is normal.  Lipase normal.  Remainder of the lab work is pending.  Patient receiving IV fluids.  The husband is now here who states he is only aware of the Xanax  that the patient took this morning.  He states he doses her medications and only 1 Xanax  was left out this morning for her.  Patient again adamantly denies taking Suboxone or muscle relaxer.  We will allow the patient to rest in the emergency department awaiting further lab results.  Receiving IV fluids.  Chest x-ray shows likely CHF changes, however I see no history of CHF.  Will send a procalcitonin as well as a BNP in addition to a troponin as a precaution.  Patient is rectal examination shows dark stool but guaiac negative.  Hemoglobin is dropped approximately 3 points and 4 months.  Patient remains confused/slowed.  Chest x-ray concerning for pulmonary edema however no history of CHF per patient.  BNP is normal.  Procalcitonin is negative making infection less likely.  Patient is satting in the low 90s has very wet sounding cough.  Patient will require admission to the hospital service given her continued confusion tachypnea rhonchi signs of edema on x-ray.  Will check a COVID swab as a precaution.  Procalcitonin negative believe bacterial infection to be less likely.  Patient CT scan has been done I have reviewed it it appears most consistent with multifocal pneumonia which fits the patient's clinical presentation better however her procalcitonin was negative.  Given the patient's somnolence concern for possible aspiration pneumonia.  Will cover with Rocephin  and Zithromax  which should have good community-acquired as well as aspiration coverage.  Will admit to the hospital service for further workup and treatment.  Vital signs remained stable.  CRITICAL CARE Performed by: Franky Moores   Total critical care time: 30 minutes  Critical care time was exclusive of  separately billable procedures and treating other patients.  Critical care was necessary to treat or prevent imminent or life-threatening deterioration.  Critical care was time spent personally by me on the following activities: development of treatment plan with patient and/or surrogate as well as nursing, discussions with consultants, evaluation of patient's response to treatment, examination of patient, obtaining history from patient or surrogate, ordering and performing treatments and interventions, ordering and review of laboratory studies, ordering and review of radiographic studies, pulse oximetry and re-evaluation of patient's condition.  FINAL CLINICAL IMPRESSION(S) / ED DIAGNOSES   Nausea vomiting Dyspnea Confusion   Note:  This document was prepared using Dragon voice recognition software and may include unintentional dictation errors.   Moores Franky, MD 05/25/24 1512

## 2024-05-25 NOTE — ED Notes (Signed)
 Pt asking for apple juice because they are so thirsty. Pt informed by this RN that they have already had 4 cups of apple juice and since their sugar was elevated on blood work that they could only have water at this time. Pt refused water.

## 2024-05-26 ENCOUNTER — Ambulatory Visit: Admitting: Family

## 2024-05-26 DIAGNOSIS — F1120 Opioid dependence, uncomplicated: Secondary | ICD-10-CM

## 2024-05-26 DIAGNOSIS — F322 Major depressive disorder, single episode, severe without psychotic features: Secondary | ICD-10-CM

## 2024-05-26 DIAGNOSIS — G894 Chronic pain syndrome: Secondary | ICD-10-CM

## 2024-05-26 DIAGNOSIS — J189 Pneumonia, unspecified organism: Secondary | ICD-10-CM | POA: Diagnosis not present

## 2024-05-26 DIAGNOSIS — K922 Gastrointestinal hemorrhage, unspecified: Secondary | ICD-10-CM

## 2024-05-26 DIAGNOSIS — R112 Nausea with vomiting, unspecified: Secondary | ICD-10-CM | POA: Diagnosis not present

## 2024-05-26 DIAGNOSIS — F4310 Post-traumatic stress disorder, unspecified: Secondary | ICD-10-CM

## 2024-05-26 LAB — CBC
HCT: 23.7 % — ABNORMAL LOW (ref 36.0–46.0)
HCT: 24.7 % — ABNORMAL LOW (ref 36.0–46.0)
Hemoglobin: 6.8 g/dL — ABNORMAL LOW (ref 12.0–15.0)
Hemoglobin: 7.2 g/dL — ABNORMAL LOW (ref 12.0–15.0)
MCH: 21.5 pg — ABNORMAL LOW (ref 26.0–34.0)
MCH: 21.9 pg — ABNORMAL LOW (ref 26.0–34.0)
MCHC: 28.7 g/dL — ABNORMAL LOW (ref 30.0–36.0)
MCHC: 29.1 g/dL — ABNORMAL LOW (ref 30.0–36.0)
MCV: 74.8 fL — ABNORMAL LOW (ref 80.0–100.0)
MCV: 75.1 fL — ABNORMAL LOW (ref 80.0–100.0)
Platelets: 225 K/uL (ref 150–400)
Platelets: 227 K/uL (ref 150–400)
RBC: 3.17 MIL/uL — ABNORMAL LOW (ref 3.87–5.11)
RBC: 3.29 MIL/uL — ABNORMAL LOW (ref 3.87–5.11)
RDW: 16.8 % — ABNORMAL HIGH (ref 11.5–15.5)
RDW: 17 % — ABNORMAL HIGH (ref 11.5–15.5)
WBC: 10.2 K/uL (ref 4.0–10.5)
WBC: 10.9 K/uL — ABNORMAL HIGH (ref 4.0–10.5)
nRBC: 0 % (ref 0.0–0.2)
nRBC: 0 % (ref 0.0–0.2)

## 2024-05-26 LAB — HEMOGLOBIN A1C
Hgb A1c MFr Bld: 5.8 % — ABNORMAL HIGH (ref 4.8–5.6)
Mean Plasma Glucose: 119.76 mg/dL

## 2024-05-26 LAB — BASIC METABOLIC PANEL WITH GFR
Anion gap: 6 (ref 5–15)
BUN: 10 mg/dL (ref 6–20)
CO2: 25 mmol/L (ref 22–32)
Calcium: 7.6 mg/dL — ABNORMAL LOW (ref 8.9–10.3)
Chloride: 108 mmol/L (ref 98–111)
Creatinine, Ser: 0.58 mg/dL (ref 0.44–1.00)
GFR, Estimated: >60 mL/min (ref >60)
Glucose, Bld: 103 mg/dL — ABNORMAL HIGH (ref 70–99)
Potassium: 3.5 mmol/L (ref 3.5–5.1)
Sodium: 139 mmol/L (ref 135–145)

## 2024-05-26 LAB — GLUCOSE, CAPILLARY: Glucose-Capillary: 75 mg/dL (ref 70–99)

## 2024-05-26 MED ORDER — SODIUM CHLORIDE 0.9 % IV SOLN
500.0000 mg | INTRAVENOUS | Status: DC
  Filled 2024-05-26: qty 5

## 2024-05-26 MED ORDER — CEFTRIAXONE SODIUM 2 G IJ SOLR
2.0000 g | INTRAMUSCULAR | Status: DC
  Filled 2024-05-26: qty 20

## 2024-05-26 MED ORDER — ALPRAZOLAM 1 MG PO TABS
1.0000 mg | ORAL_TABLET | Freq: Every evening | ORAL | Status: DC | PRN
Start: 2024-05-26 — End: 2024-05-26
  Administered 2024-05-26: 1 mg via ORAL
  Filled 2024-05-26: qty 1

## 2024-05-26 NOTE — Progress Notes (Addendum)
 Patient left AMA. Education by this clinical research associate given on the importance of staying for medical workup.  Provider came bedside and spoke with pt with decision remaining to leave.  IV removed by staff and paper work signed.

## 2024-05-26 NOTE — Hospital Course (Addendum)
 Partly taken from H&P.  Karen [name] is a 43 y.o. year old female with past medical history of MDD, GAD, PTSD and opioid use disorder presenting to the ED with nausea and vomiting.  She was also having generalized aches and pain.  No fever or chills.  On presentation hemodynamically stable, labs with no leukocytosis, mild microcytic anemia, CMP with known anion gap metabolic acidosis and slight hyperglycemia.  Toxicology lab negative for alcohol, acetaminophen  and salicylate.  BNP and troponin normal.  Respiratory panel negative. CXR with some concern of multifocal pneumonia. Blood cultures were obtained.  Patient did received 1 dose of IV antibiotics by EDP but they were not continued for concern of pneumonitis not pneumonia.  11/6: Vital stable, UDS positive for cannabinoid and benzodiazepines, patient takes Xanax  regularly at home.  Procalcitonin negative, preliminary blood cultures negative in 12-hour, CT chest with multiple patchy bilateral airspace opacities, left greater than right, consistent with multifocal pneumonia.  Significant decrease in hemoglobin to 6.8 and came back 7 on repeat.  Per patient she had dark brown color emesis which has been resolved now.  She takes Goody powder regularly and get very offended when told her that might have caused some GI bleed.  Patient was very anxious and does not want to stay despite explaining that we need further workup.  Per patient she has an history of frequent pneumonias but does not has any respiratory symptoms.  She denies any smoking or alcohol use.  She will get benefit from pulmonary evaluation for abnormal CT.  We were planning to involve GI but patient refused to stay stating that her mother needs her as she lost her father recently.  Tried explaining multiple times but she does not want to listen anything and pulled all her IVs and left.  Patient should not be using any NSAID and need further evaluation for concern of GI bleed.  I  tried my best to explain and make her stay for at least another day for further evaluation and management.

## 2024-05-26 NOTE — Discharge Summary (Signed)
 Physician Discharge Summary   Patient: Karen [name] MRN: [id number] DOB: [birth year]  Admit date:     05/25/2024  Discharge date: 05/26/24  Discharge Physician: Amaryllis Dare   PCP: Orlean Alan HERO, FNP   Recommendations at discharge:   Patient left AMA  Discharge Diagnoses: Principal Problem:   Intractable nausea and vomiting Active Problems:   Mild intermittent asthma   Chronic pain syndrome   PTSD (post-traumatic stress disorder)   Major depressive disorder, single episode, severe (HCC)   Opioid dependence, uncomplicated (HCC)  Resolved Problems:   * No resolved hospital problems. [facility] Course: Partly taken from H&P.  Karen [name] is a 43 y.o. year old female with past medical history of MDD, GAD, PTSD and opioid use disorder presenting to the ED with nausea and vomiting.  She was also having generalized aches and pain.  No fever or chills.  On presentation hemodynamically stable, labs with no leukocytosis, mild microcytic anemia, CMP with known anion gap metabolic acidosis and slight hyperglycemia.  Toxicology lab negative for alcohol, acetaminophen  and salicylate.  BNP and troponin normal.  Respiratory panel negative. CXR with some concern of multifocal pneumonia. Blood cultures were obtained.  Patient did received 1 dose of IV antibiotics by EDP but they were not continued for concern of pneumonitis not pneumonia.  11/6: Vital stable, UDS positive for cannabinoid and benzodiazepines, patient takes Xanax  regularly at home.  Procalcitonin negative, preliminary blood cultures negative in 12-hour, CT chest with multiple patchy bilateral airspace opacities, left greater than right, consistent with multifocal pneumonia.  Significant decrease in hemoglobin to 6.8 and came back 7 on repeat.  Per patient she had dark brown color emesis which has been resolved now.  She takes Goody powder regularly and get very offended when told her that might have caused some GI  bleed.  Patient was very anxious and does not want to stay despite explaining that we need further workup.  Per patient she has an history of frequent pneumonias but does not has any respiratory symptoms.  She denies any smoking or alcohol use.  She will get benefit from pulmonary evaluation for abnormal CT.  We were planning to involve GI but patient refused to stay stating that her mother needs her as she lost her father recently.  Tried explaining multiple times but she does not want to listen anything and pulled all her IVs and left.  Patient should not be using any NSAID and need further evaluation for concern of GI bleed.  I tried my best to explain and make her stay for at least another day for further evaluation and management.   Consultants: None Procedures performed: None Disposition: Home Diet recommendation:   DISCHARGE MEDICATION:  No med rec as patient left AMA  Discharge Exam: Filed Weights   05/25/24 0927 05/26/24 0345  Weight: 66.3 kg 66.5 kg   General.     In no acute distress. Pulmonary.  Lungs clear bilaterally, normal respiratory effort. CV.  Regular rate and rhythm, no JVD, rub or murmur. Abdomen.  Soft, nontender, nondistended, BS positive. CNS.  Alert and oriented .  No focal neurologic deficit. Extremities.  No edema, no cyanosis, pulses intact and symmetrical. Psychiatry.  Appears very anxious and does not want to listen to any medical advice Condition at discharge:   The results of significant diagnostics from this hospitalization (including imaging, microbiology, ancillary and laboratory) are listed below for reference.   Imaging Studies: CT CHEST WO CONTRAST Result Date: 05/25/2024  EXAM: CT CHEST WITHOUT CONTRAST 05/25/2024 02:58:00 PM TECHNIQUE: CT of the chest was performed without the administration of intravenous contrast. Multiplanar reformatted images are provided for review. Automated exposure control, iterative reconstruction, and/or weight  based adjustment of the mA/kV was utilized to reduce the radiation dose to as low as reasonably achievable. COMPARISON: Previous exams are available. CLINICAL HISTORY: Respiratory illness, nondiagnostic xray. FINDINGS: MEDIASTINUM: Heart and pericardium are unremarkable. The central airways are clear. LYMPH NODES: No mediastinal, hilar or axillary lymphadenopathy. LUNGS AND PLEURA: Multiple patchy airspace opacities are noted in both lungs, left greater than right, most consistent with multifocal pneumonia. No pleural effusion or pneumothorax. SOFT TISSUES/BONES: No acute abnormality of the bones or soft tissues. UPPER ABDOMEN: Limited images of the upper abdomen demonstrates no acute abnormality. IMPRESSION: 1. Multiple patchy bilateral airspace opacities, left greater than right, consistent with multifocal pneumonia. Electronically signed by: Lynwood Seip MD 05/25/2024 03:15 PM EST RP Workstation: [id number]   DG Chest Portable 1 View Result Date: 05/25/2024 CLINICAL DATA:  Cough and emesis. EXAM: PORTABLE CHEST 1 VIEW COMPARISON:  09/27/2016 and CT chest 12/21/2015. FINDINGS: Trachea is midline. Heart is enlarged. Bilateral mixed interstitial and airspace opacification. Probable small left pleural effusion. IMPRESSION: Congestive heart failure.  Difficult to exclude pneumonia. Electronically Signed   By: Newell Eke M.D.   On: 05/25/2024 11:38   DG Abdomen 1 View Result Date: 05/13/2024 CLINICAL DATA:  Blood in urine EXAM: ABDOMEN - 1 VIEW COMPARISON:  None Available. FINDINGS: Nonobstructed gas pattern with mild to moderate stool. No radiopaque calculi. Right hip replacement. Prominent splenic shadow. IMPRESSION: 1. Nonobstructed gas pattern with mild to moderate stool. No radiopaque calculi. 2. Prominent splenic shadow, correlate with physical exam Electronically Signed   By: Luke Bun M.D.   On: 05/13/2024 19:33   MM 3D DIAGNOSTIC MAMMOGRAM BILATERAL BREAST Result Date: 05/10/2024 CLINICAL  DATA:  LEFT axillary lump x4 weeks, patient unable to pinpoint the exact location. Baseline mammogram. EXAM: DIGITAL DIAGNOSTIC BILATERAL MAMMOGRAM WITH TOMOSYNTHESIS AND CAD; ULTRASOUND LEFT BREAST LIMITED TECHNIQUE: Bilateral digital diagnostic mammography and breast tomosynthesis was performed. The images were evaluated with computer-aided detection. ; Targeted ultrasound examination of the left breast was performed. COMPARISON:  Previous exam(s). ACR Breast Density Category b: There are scattered areas of fibroglandular density. FINDINGS: Spot compression tomosynthesis views were obtained over the general area of palpable concern in the LEFT breast. No suspicious mammographic finding is identified in this area. Questioned asymmetry in the LEFT breast resolves with additional imaging, compatible with normal overlapping fibroglandular tissue. No suspicious mass, microcalcification, or other finding is identified in EITHER breast. On physical examination of the area of concern indicated by the patient, there is no suspicious palpable abnormality, although the patient endorses focal tenderness to palpation in the axilla. Targeted LEFT breast ultrasound was performed in the palpable area of concern at the axilla. No suspicious solid or cystic mass is identified. A few normal lymph nodes are seen. Representative negative pictures were taken. Precautionary ultrasound was performed in the region of resolved asymmetry at the approximate 12 o'clock location in the LEFT breast, without suspicious sonographic abnormality seen. Representative negative pictures were taken. IMPRESSION: No mammographic or sonographic etiology for focal LEFTbreast palpable and painful abnormalityidentified. There is no mammographic evidence of malignancy in EITHER breast. Clinical follow-up recommended to discuss any further work-up recommendations and appropriate treatment, which should be based on the clinical assessment. Findings and  recommendations were discussed with the patient in person. RECOMMENDATION: Patient may return  to routine screening mammogram in one year.(Code:SM-B-01Y). Clinical follow-up of LEFT breast symptoms as above. I have discussed the findings and recommendations with the patient. If applicable, a reminder letter will be sent to the patient regarding the next appointment. BI-RADS CATEGORY  1: Negative. Electronically Signed   By: Norleen Croak M.D.   On: 05/10/2024 11:33   US  LIMITED ULTRASOUND INCLUDING AXILLA LEFT BREAST  Result Date: 05/10/2024 CLINICAL DATA:  LEFT axillary lump x4 weeks, patient unable to pinpoint the exact location. Baseline mammogram. EXAM: DIGITAL DIAGNOSTIC BILATERAL MAMMOGRAM WITH TOMOSYNTHESIS AND CAD; ULTRASOUND LEFT BREAST LIMITED TECHNIQUE: Bilateral digital diagnostic mammography and breast tomosynthesis was performed. The images were evaluated with computer-aided detection. ; Targeted ultrasound examination of the left breast was performed. COMPARISON:  Previous exam(s). ACR Breast Density Category b: There are scattered areas of fibroglandular density. FINDINGS: Spot compression tomosynthesis views were obtained over the general area of palpable concern in the LEFT breast. No suspicious mammographic finding is identified in this area. Questioned asymmetry in the LEFT breast resolves with additional imaging, compatible with normal overlapping fibroglandular tissue. No suspicious mass, microcalcification, or other finding is identified in EITHER breast. On physical examination of the area of concern indicated by the patient, there is no suspicious palpable abnormality, although the patient endorses focal tenderness to palpation in the axilla. Targeted LEFT breast ultrasound was performed in the palpable area of concern at the axilla. No suspicious solid or cystic mass is identified. A few normal lymph nodes are seen. Representative negative pictures were taken. Precautionary ultrasound was  performed in the region of resolved asymmetry at the approximate 12 o'clock location in the LEFT breast, without suspicious sonographic abnormality seen. Representative negative pictures were taken. IMPRESSION: No mammographic or sonographic etiology for focal LEFTbreast palpable and painful abnormalityidentified. There is no mammographic evidence of malignancy in EITHER breast. Clinical follow-up recommended to discuss any further work-up recommendations and appropriate treatment, which should be based on the clinical assessment. Findings and recommendations were discussed with the patient in person. RECOMMENDATION: Patient may return to routine screening mammogram in one year.(Code:SM-B-01Y). Clinical follow-up of LEFT breast symptoms as above. I have discussed the findings and recommendations with the patient. If applicable, a reminder letter will be sent to the patient regarding the next appointment. BI-RADS CATEGORY  1: Negative. Electronically Signed   By: Norleen Croak M.D.   On: 05/10/2024 11:33    Microbiology: Results for orders placed or performed during the hospital encounter of 05/25/24  Resp panel by RT-PCR (RSV, Flu A&B, Covid) Anterior Nasal Swab     Status: None   Collection Time: 05/25/24  2:26 PM   Specimen: Anterior Nasal Swab  Result Value Ref Range Status   SARS Coronavirus 2 by RT PCR NEGATIVE NEGATIVE Final    Comment: (NOTE) SARS-CoV-2 target nucleic acids are NOT DETECTED.  The SARS-CoV-2 RNA is generally detectable in upper respiratory specimens during the acute phase of infection. The lowest concentration of SARS-CoV-2 viral copies this assay can detect is 138 copies/mL. A negative result does not preclude SARS-Cov-2 infection and should not be used as the sole basis for treatment or other patient management decisions. A negative result may occur with  improper specimen collection/handling, submission of specimen other than nasopharyngeal swab, presence of viral  mutation(s) within the areas targeted by this assay, and inadequate number of viral copies(<138 copies/mL). A negative result must be combined with clinical observations, patient history, and epidemiological information. The expected result is Negative.  Fact  Sheet for Patients:  bloggercourse.com  Fact Sheet for Healthcare Providers:  seriousbroker.it  This test is no t yet approved or cleared by the [place]  FDA and  has been authorized for detection and/or diagnosis of SARS-CoV-2 by FDA under an Emergency Use Authorization (EUA). This EUA will remain  in effect (meaning this test can be used) for the duration of the COVID-19 declaration under Section 564(b)(1) of the Act, 21 U.S.C.section 360bbb-3(b)(1), unless the authorization is terminated  or revoked sooner.       Influenza A by PCR NEGATIVE NEGATIVE Final   Influenza B by PCR NEGATIVE NEGATIVE Final    Comment: (NOTE) The Xpert Xpress SARS-CoV-2/FLU/RSV plus assay is intended as an aid in the diagnosis of influenza from Nasopharyngeal swab specimens and should not be used as a sole basis for treatment. Nasal washings and aspirates are unacceptable for Xpert Xpress SARS-CoV-2/FLU/RSV testing.  Fact Sheet for Patients: bloggercourse.com  Fact Sheet for Healthcare Providers: seriousbroker.it  This test is not yet approved or cleared by the [place]  FDA and has been authorized for detection and/or diagnosis of SARS-CoV-2 by FDA under an Emergency Use Authorization (EUA). This EUA will remain in effect (meaning this test can be used) for the duration of the COVID-19 declaration under Section 564(b)(1) of the Act, 21 U.S.C. section 360bbb-3(b)(1), unless the authorization is terminated or revoked.     Resp Syncytial Virus by PCR NEGATIVE NEGATIVE Final    Comment: (NOTE) Fact Sheet for  Patients: bloggercourse.com  Fact Sheet for Healthcare Providers: seriousbroker.it  This test is not yet approved or cleared by the [place]  FDA and has been authorized for detection and/or diagnosis of SARS-CoV-2 by FDA under an Emergency Use Authorization (EUA). This EUA will remain in effect (meaning this test can be used) for the duration of the COVID-19 declaration under Section 564(b)(1) of the Act, 21 U.S.C. section 360bbb-3(b)(1), unless the authorization is terminated or revoked.  Performed at [facility], [address] [place], [place] [address]   Blood Culture (routine x 2)     Status: None (Preliminary result)   Collection Time: 05/25/24  3:52 PM   Specimen: BLOOD RIGHT ARM  Result Value Ref Range Status   Specimen Description BLOOD RIGHT ARM  Final   Special Requests   Final    BOTTLES DRAWN AEROBIC AND ANAEROBIC Blood Culture results may not be optimal due to an inadequate volume of blood received in culture bottles   Culture   Final    NO GROWTH < 12 HOURS Performed at [facility], [address], [place], [place] [address]    Report Status PENDING  Incomplete  Blood Culture (routine x 2)     Status: None (Preliminary result)   Collection Time: 05/25/24  3:52 PM   Specimen: BLOOD LEFT ARM  Result Value Ref Range Status   Specimen Description BLOOD LEFT ARM  Final   Special Requests   Final    BOTTLES DRAWN AEROBIC AND ANAEROBIC Blood Culture adequate volume   Culture   Final    NO GROWTH < 12 HOURS Performed at [facility], [address] [place], [place] [address]    Report Status PENDING  Incomplete    Labs: CBC: Recent Labs  Lab 05/25/24 0928 05/26/24 0542 05/26/24 0926  WBC 7.1 10.2 10.9*  HGB 8.8* 6.8* 7.2*  HCT 29.9* 23.7* 24.7*  MCV 74.9* 74.8* 75.1*  PLT 287 225 227   Basic Metabolic Panel: Recent Labs  Lab 05/25/24 0928 05/26/24 0542  NA 141  139  K 3.9 3.5  CL 107 108  CO2 21* 25  GLUCOSE 142* 103*  BUN 15 10  CREATININE 0.81 0.58  CALCIUM 8.4* 7.6*  MG 1.7  --    Liver Function Tests: Recent Labs  Lab 05/25/24 0928  AST 20  ALT 13  ALKPHOS 72  BILITOT 0.4  PROT 7.0  ALBUMIN 3.6   CBG: Recent Labs  Lab 05/26/24 0754  GLUCAP 75    Discharge time spent: greater than 30 minutes.  Patient was seen today and was billed as level 3 progressive care.  Discharge was not billed as she left AMA  This record has been created using Conservation officer, historic buildings. Errors have been sought and corrected,but may not always be located. Such creation errors do not reflect on the standard of care.   Signed: Amaryllis Dare, MD [facility] 05/26/2024

## 2024-05-26 NOTE — Plan of Care (Signed)
  Problem: Education: Goal: Knowledge of General Education information will improve Description: Including pain rating scale, medication(s)/side effects and non-pharmacologic comfort measures Outcome: Progressing   Problem: Activity: Goal: Risk for activity intolerance will decrease Outcome: Progressing   Problem: Nutrition: Goal: Adequate nutrition will be maintained Outcome: Progressing   Problem: Elimination: Goal: Will not experience complications related to bowel motility Outcome: Progressing   Problem: Pain Managment: Goal: General experience of comfort will improve and/or be controlled Outcome: Progressing   Problem: Safety: Goal: Ability to remain free from injury will improve Outcome: Progressing   Problem: Skin Integrity: Goal: Risk for impaired skin integrity will decrease Outcome: Progressing

## 2024-05-27 ENCOUNTER — Encounter: Payer: Self-pay | Admitting: Cardiology

## 2024-05-27 ENCOUNTER — Ambulatory Visit (INDEPENDENT_AMBULATORY_CARE_PROVIDER_SITE_OTHER): Admitting: Cardiology

## 2024-05-27 VITALS — BP 104/68 | HR 83 | Ht <= 58 in | Wt 149.0 lb

## 2024-05-27 DIAGNOSIS — J4520 Mild intermittent asthma, uncomplicated: Secondary | ICD-10-CM | POA: Diagnosis not present

## 2024-05-27 DIAGNOSIS — J189 Pneumonia, unspecified organism: Secondary | ICD-10-CM

## 2024-05-27 DIAGNOSIS — K922 Gastrointestinal hemorrhage, unspecified: Secondary | ICD-10-CM | POA: Diagnosis not present

## 2024-05-27 DIAGNOSIS — Z131 Encounter for screening for diabetes mellitus: Secondary | ICD-10-CM

## 2024-05-27 DIAGNOSIS — Z0130 Encounter for examination of blood pressure without abnormal findings: Secondary | ICD-10-CM

## 2024-05-27 MED ORDER — AZITHROMYCIN 500 MG PO TABS: ORAL_TABLET | ORAL | 0 refills | Status: DC

## 2024-05-27 NOTE — Progress Notes (Signed)
 Established Patient Office Visit  Subjective:  Patient ID: Karen [name], female    DOB: [birth year]  Age: 43 y.o. MRN: [id number]  Chief Complaint  Patient presents with   Hospitalization Follow-up    Hospital Follow Up    Patient in office for hospital follow up. Patient went to the ED on 05/25/2024 with complaints of nausea and vomiting. Patient required admission to the hospital service given her continued confusion, tachypnea, rhonchi, signs of edema on x-ray. Patient was being worked up for CAP. Also, hemoglobin was decreased, possible GI bleed. Patient left the hospital against medical advice on 05/26/2024. Patient's father recently passed away. Patient is very tearful in office today. Patient received one dose of antibiotics for CAP while in the hospital. Will send in azithromycin .  Will send a referral to GI for work up for possible GI bleed.  Referral sent to pulmonary for history of asthma.     No other concerns at this time.   Past Medical History:  Diagnosis Date   Allergy    Anxiety    Asthma    GERD (gastroesophageal reflux disease)    Internal hemorrhoids    Pneumonia    Primary osteoarthritis of right hip     Past Surgical History:  Procedure Laterality Date   CARPAL TUNNEL RELEASE Bilateral 2010   CESAREAN SECTION  2009   COLONOSCOPY     EPIGASTRIC HERNIA REPAIR N/A 09/03/2015   Procedure: HERNIA REPAIR EPIGASTRIC ADULT;  Surgeon: Reyes LELON Cota, MD;  Location: [facility] ORS;  Service: General;  Laterality: N/A;   HIP SURGERY  2013   OVARIAN CYST [facility]  [facility] [place]   TONSILLECTOMY  2010   TOTAL HIP ARTHROPLASTY Right 11/26/2023   Procedure: ARTHROPLASTY, HIP, TOTAL, ANTERIOR APPROACH;  Surgeon: Lorelle Hussar, MD;  Location: [facility] ORS;  Service: Orthopedics;  Laterality: Right;    Social History   Socioeconomic History   Marital status: Married    Spouse name: Leech,Steven (Significant other)   Number of children: 1   Years of  education: Not on file   Highest education level: Not on file  Occupational History   Not on file  Tobacco Use   Smoking status: Former    Current packs/day: 0.00    Average packs/day: 0.5 packs/day for 18.0 years (9.0 ttl pk-yrs)    Types: Cigarettes    Start date: 07/21/1996    Quit date: 07/21/2014    Years since quitting: 9.8   Smokeless tobacco: Never  Vaping Use   Vaping status: Never Used  Substance and Sexual Activity   Alcohol use: No    Alcohol/week: 0.0 standard drinks of alcohol   Drug use: No   Sexual activity: Yes  Other Topics Concern   Not on file  Social History Narrative   Not on file   Social Drivers of Health   Financial Resource Strain: Medium Risk (01/13/2024)   Received from [facility]   Overall Financial Resource Strain (CARDIA)    Difficulty of Paying Living Expenses: Somewhat hard  Food Insecurity: Patient Declined (05/25/2024)   Hunger Vital Sign    Worried About Running Out of Food in the Last Year: Patient declined    Ran Out of Food in the Last Year: Patient declined  Transportation Needs: Patient Declined (05/25/2024)   PRAPARE - Administrator, Civil Service (Medical): Patient declined    Lack of Transportation (Non-Medical): Patient declined  Physical Activity: Not on file  Stress: Not on file  Social Connections: Not on file  Intimate Partner Violence: Patient Declined (05/25/2024)   Humiliation, Afraid, Rape, and Kick questionnaire    Fear of Current or Ex-Partner: Patient declined    Emotionally Abused: Patient declined    Physically Abused: Patient declined    Sexually Abused: Patient declined    Family History  Problem Relation Age of Onset   COPD Mother    COPD Father    Breast cancer Maternal Aunt     Allergies  Allergen Reactions   Benadryl [Diphenhydramine Hcl] Other (See Comments)    Leg jumpy   Cyclobenzaprine Other (See Comments)     legs jumpy  Restless legs   Ketorolac Hives    Motrin [Ibuprofen] Hives   Ketorolac Tromethamine Rash   Tramadol Rash and Hives    Outpatient Medications Prior to Visit  Medication Sig   acetaminophen  (TYLENOL ) 500 MG tablet Take 1,000 mg by mouth every 6 (six) hours as needed.   ALPRAZolam  (XANAX ) 1 MG tablet Take 1 tablet (1 mg total) by mouth 2 (two) times daily as needed for anxiety.   Brexpiprazole  (REXULTI ) 0.5 MG TABS Take 1 tablet (0.5 mg total) by mouth at bedtime.   budeson-glycopyrrolate-formoterol  (BREZTRI AEROSPHERE) 160-9-4.8 MCG/ACT AERO inhaler Inhale 2 puffs into the lungs 2 (two) times daily as needed (respiratory issues.).   buprenorphine-naloxone (SUBOXONE) 8-2 mg SUBL SL tablet BUPRENORPHINE HCL-NALOXONE HCL 8-2 MG SUBL   BUTRANS 10 MCG/HR PTWK BUTRANS 10 MCG/HR PTWK   medroxyPROGESTERone  Acetate 150 MG/ML SUSY INJECT 1 ML (150 MG TOTAL) INTO THE MUSCLE EVERY 3 (THREE) MONTHS   methocarbamol  (ROBAXIN ) 500 MG tablet Take 500 mg by mouth 3 (three) times daily as needed.   oxyCODONE  (OXY IR/ROXICODONE ) 5 MG immediate release tablet Take 5 mg by mouth every 12 (twelve) hours as needed for severe pain (pain score 7-10).   vortioxetine  HBr (TRINTELLIX ) 20 MG TABS tablet Take 1 tablet (20 mg total) by mouth daily.   No facility-administered medications prior to visit.    Review of Systems  Constitutional: Negative.   HENT: Negative.    Eyes: Negative.   Respiratory: Negative.  Negative for shortness of breath.   Cardiovascular: Negative.  Negative for chest pain.  Gastrointestinal: Negative.  Negative for abdominal pain, constipation and diarrhea.  Genitourinary: Negative.   Musculoskeletal:  Negative for joint pain and myalgias.  Skin: Negative.   Neurological: Negative.  Negative for dizziness and headaches.  Endo/Heme/Allergies: Negative.   Psychiatric/Behavioral:  Positive for depression. The patient is nervous/anxious.   All other systems reviewed and are negative.      Objective:   BP 104/68   Pulse  83   Ht 4' 8 (1.422 m)   Wt 149 lb (67.6 kg)   LMP 09/27/2016   SpO2 96%   BMI 33.41 kg/m   Vitals:   05/27/24 1145  BP: 104/68  Pulse: 83  Height: 4' 8 (1.422 m)  Weight: 149 lb (67.6 kg)  SpO2: 96%  BMI (Calculated): 33.42    Physical Exam Vitals and nursing note reviewed.  Constitutional:      Appearance: Normal appearance. She is normal weight.  HENT:     Head: Normocephalic and atraumatic.     Nose: Nose normal.     Mouth/Throat:     Mouth: Mucous membranes are moist.  Eyes:     Extraocular Movements: Extraocular movements intact.     Conjunctiva/sclera: Conjunctivae normal.     Pupils: Pupils are equal,  round, and reactive to light.  Cardiovascular:     Rate and Rhythm: Normal rate and regular rhythm.     Pulses: Normal pulses.     Heart sounds: Normal heart sounds.  Pulmonary:     Effort: Pulmonary effort is normal. No respiratory distress.     Breath sounds: Normal breath sounds. No stridor. No wheezing, rhonchi or rales.  Abdominal:     General: Abdomen is flat. Bowel sounds are normal.     Palpations: Abdomen is soft.  Musculoskeletal:        General: Normal range of motion.     Cervical back: Normal range of motion.  Skin:    General: Skin is warm and dry.  Neurological:     General: No focal deficit present.     Mental Status: She is alert and oriented to person, place, and time.  Psychiatric:        Mood and Affect: Mood normal.        Behavior: Behavior normal.        Thought Content: Thought content normal.        Judgment: Judgment normal.      No results found for any visits on 05/27/24.  Recent Results (from the past 2160 hours)  ToxASSURE Select 13 (MW), Urine     Status: None   Collection Time: 04/05/24  3:57 PM  Result Value Ref Range   Summary FINAL     Comment: ==================================================================== ToxASSURE Select 13  (MW) ==================================================================== Test                             Result       Flag       Units  Drug Present   Oxazepam                       41                      ng/mg creat    Oxazepam may be administered as a scheduled prescription medication;    it is also an expected metabolite of other benzodiazepine drugs,    including diazepam , chlordiazepoxide, prazepam, clorazepate,    halazepam, and temazepam.    Carboxy-THC                    293                     ng/mg creat    Carboxy-THC is a metabolite of tetrahydrocannabinol (THC). Source of    THC is most commonly herbal marijuana or marijuana-based products,    but THC is also present in a scheduled prescription medication.    Trace amounts of THC can be present in hemp and cannabidiol (CBD)    products. This test is not intended to distinguish between delta-9-    tetrahydrocannabin ol, the predominant form of THC in most herbal or    marijuana-based products, and delta-8-tetrahydrocannabinol.    Oxycodone                       34                      ng/mg creat   Oxymorphone                    36  ng/mg creat   Noroxycodone                   382                     ng/mg creat   Noroxymorphone                 162                     ng/mg creat    Sources of oxycodone  are scheduled prescription medications.    Oxymorphone, noroxycodone, and noroxymorphone are expected    metabolites of oxycodone . Oxymorphone is also available as a    scheduled prescription medication.    Buprenorphine                  235                     ng/mg creat   Norbuprenorphine               >437                    ng/mg creat    Source of buprenorphine is a scheduled prescription medication.    Norbuprenorphine is an expected metabolite of buprenorphine.  ==================================================================== Test                       Result    Flag   Units      Ref  Range   Creatinine              229              mg/dL      >=79 ==================================================================== Declared Medications:  Medication list was not provided. ==================================================================== For clinical consultation, please call [phone]. ====================================================================   Urinalysis, w/ Reflex to Culture (Infection Suspected) -Urine, Clean Catch     Status: Abnormal   Collection Time: 05/13/24  6:36 PM  Result Value Ref Range   Specimen Source URINE, CLEAN CATCH    Color, Urine Kelven Flater (A) YELLOW    Comment: BIOCHEMICALS MAY BE AFFECTED BY COLOR   APPearance HAZY (A) CLEAR   Specific Gravity, Urine 1.025 1.005 - 1.030   pH 5.5 5.0 - 8.0   Glucose, UA NEGATIVE NEGATIVE mg/dL   Hgb urine dipstick MODERATE (A) NEGATIVE   Bilirubin Urine NEGATIVE NEGATIVE   Ketones, ur NEGATIVE NEGATIVE mg/dL   Protein, ur NEGATIVE NEGATIVE mg/dL   Nitrite NEGATIVE NEGATIVE   Leukocytes,Ua NEGATIVE NEGATIVE   Squamous Epithelial / HPF 11-20 0 - 5 /HPF   WBC, UA 0-5 0 - 5 WBC/hpf    Comment: Reflex urine culture not performed if WBC <=10, OR if Squamous epithelial cells >5. If Squamous epithelial cells >5, suggest recollection.   RBC / HPF 11-20 0 - 5 RBC/hpf   Bacteria, UA MANY (A) NONE SEEN   Mucus PRESENT     Comment: Performed at [facility], [address], [place], [place] [address]  Urine Culture     Status: None   Collection Time: 05/13/24  6:36 PM   Specimen: Urine, Clean Catch  Result Value Ref Range   Specimen Description      URINE, CLEAN CATCH Performed at [facility], [address] [place], [place] [address]    Special Requests      NONE Performed at [facility]  [facility]gent [facility], [address] [place], [place] [address]    Culture      NO GROWTH Performed at [facility], [address] [place], [place] [address]    Report Status  05/14/2024 FINAL   Lipase, blood     Status: None   Collection Time: 05/25/24  9:28 AM  Result Value Ref Range   Lipase 27 11 - 51 U/L    Comment: Performed at [facility], [address] [place], [place] [address]  Comprehensive metabolic panel     Status: Abnormal   Collection Time: 05/25/24  9:28 AM  Result Value Ref Range   Sodium 141 135 - 145 mmol/L   Potassium 3.9 3.5 - 5.1 mmol/L   Chloride 107 98 - 111 mmol/L   CO2 21 (L) 22 - 32 mmol/L   Glucose, Bld 142 (H) 70 - 99 mg/dL    Comment: Glucose reference range applies only to samples taken after fasting for at least 8 hours.   BUN 15 6 - 20 mg/dL   Creatinine, Ser 9.18 0.44 - 1.00 mg/dL   Calcium 8.4 (L) 8.9 - 10.3 mg/dL   Total Protein 7.0 6.5 - 8.1 g/dL   Albumin 3.6 3.5 - 5.0 g/dL   AST 20 15 - 41 U/L   ALT 13 0 - 44 U/L   Alkaline Phosphatase 72 38 - 126 U/L   Total Bilirubin 0.4 0.0 - 1.2 mg/dL   GFR, Estimated >39 >39 mL/min    Comment: (NOTE) Calculated using the CKD-EPI Creatinine Equation (2021)    Anion gap 13 5 - 15    Comment: Performed at [facility], [address] [place], [place] [address]  CBC     Status: Abnormal   Collection Time: 05/25/24  9:28 AM  Result Value Ref Range   WBC 7.1 4.0 - 10.5 K/uL   RBC 3.99 3.87 - 5.11 MIL/uL   Hemoglobin 8.8 (L) 12.0 - 15.0 g/dL    Comment: Reticulocyte Hemoglobin testing may be clinically indicated, consider ordering this additional test [id number]    HCT 29.9 (L) 36.0 - 46.0 %   MCV 74.9 (L) 80.0 - 100.0 fL   MCH 22.1 (L) 26.0 - 34.0 pg   MCHC 29.4 (L) 30.0 - 36.0 g/dL   RDW 83.1 (H) 88.4 - 84.4 %   Platelets 287 150 - 400 K/uL   nRBC 0.0 0.0 - 0.2 %    Comment: Performed at [facility], [address] [place], [place] [address]  Ethanol     Status: None   Collection Time: 05/25/24  9:28 AM  Result Value Ref Range   Alcohol, Ethyl (B) <15 <15 mg/dL    Comment: (NOTE) For medical purposes only. Performed at [facility], [address] [place], [place] [address]   Acetaminophen  level     Status: Abnormal   Collection Time: 05/25/24  9:28 AM  Result Value Ref Range   Acetaminophen  (Tylenol ), Serum <10 (L) 10 - 30 ug/mL    Comment: (NOTE) Therapeutic concentrations vary significantly. A range of 10-30 ug/mL  may be an effective concentration for many patients. However, some  are best treated at concentrations outside of this range. Acetaminophen  concentrations >150 ug/mL at 4 hours after ingestion  and >50 ug/mL at 12 hours after ingestion are often associated with  toxic reactions.  Performed at [facility], [address] [place], [place] [address]   Salicylate level  Status: Abnormal   Collection Time: 05/25/24  9:28 AM  Result Value Ref Range   Salicylate Lvl <7.0 (L) 7.0 - 30.0 mg/dL    Comment: Performed at [facility] [address] [place], [place] [address]  Brain natriuretic peptide     Status: None   Collection Time: 05/25/24  9:28 AM  Result Value Ref Range   B Natriuretic Peptide 63.9 0.0 - 100.0 pg/mL    Comment: Performed at [facility], [address] [place], [place] [address]  Troponin I (High Sensitivity)     Status: None   Collection Time: 05/25/24  9:28 AM  Result Value Ref Range   Troponin I (High Sensitivity) 4 <18 ng/L    Comment: (NOTE) Elevated high sensitivity troponin I (hsTnI) values and significant  changes across serial measurements may suggest ACS but many other  chronic and acute conditions are known to elevate hsTnI results.  Refer to the Links section for chest pain algorithms and additional  guidance. Performed at [facility], [address] [place], [place] [address]   Procalcitonin     Status: None   Collection Time: 05/25/24  9:28 AM  Result Value Ref Range   Procalcitonin <0.10 ng/mL    Comment:        Interpretation: PCT (Procalcitonin) <= 0.5 ng/mL: Systemic infection (sepsis)  is not likely. Local bacterial infection is possible. (NOTE)       Sepsis PCT Algorithm           Lower Respiratory Tract                                      Infection PCT Algorithm    ----------------------------     ----------------------------         PCT < 0.25 ng/mL                PCT < 0.10 ng/mL          Strongly encourage             Strongly discourage   discontinuation of antibiotics    initiation of antibiotics    ----------------------------     -----------------------------       PCT 0.25 - 0.50 ng/mL            PCT 0.10 - 0.25 ng/mL               OR       >80% decrease in PCT            Discourage initiation of                                            antibiotics      Encourage discontinuation           of antibiotics    ----------------------------     -----------------------------         PCT >= 0.50 ng/mL              PCT 0.26 - 0.50 ng/mL               AND        <80% decrease in PCT             Encourage initiation of  antibiotics       Encourage continuation           of antibiotics    ----------------------------     -----------------------------        PCT >= 0.50 ng/mL                  PCT > 0.50 ng/mL               AND         increase in PCT                  Strongly encourage                                      initiation of antibiotics    Strongly encourage escalation           of antibiotics                                     -----------------------------                                           PCT <= 0.25 ng/mL                                                 OR                                        > 80% decrease in PCT                                      Discontinue / Do not initiate                                             antibiotics  Performed at [facility], [address] [place], [place] [address]   Magnesium     Status: None   Collection Time: 05/25/24  9:28 AM  Result  Value Ref Range   Magnesium 1.7 1.7 - 2.4 mg/dL    Comment: Performed at [facility], [address] [place], [place] [address]  Urinalysis, Routine w reflex microscopic -Urine, Clean Catch     Status: Abnormal   Collection Time: 05/25/24 11:33 AM  Result Value Ref Range   Color, Urine STRAW (A) YELLOW   APPearance CLEAR (A) CLEAR   Specific Gravity, Urine 1.012 1.005 - 1.030   pH 5.0 5.0 - 8.0   Glucose, UA NEGATIVE NEGATIVE mg/dL   Hgb urine dipstick NEGATIVE NEGATIVE   Bilirubin Urine NEGATIVE NEGATIVE   Ketones, ur NEGATIVE NEGATIVE mg/dL   Protein, ur NEGATIVE NEGATIVE mg/dL   Nitrite NEGATIVE NEGATIVE   Leukocytes,Ua NEGATIVE NEGATIVE    Comment: Performed at [facility], [address] [place], [place] [address]  Urine Drug Screen, Qualitative ([facility] only)     Status: Abnormal   Collection Time: 05/25/24 11:33 AM  Result Value Ref Range   Tricyclic, Ur Screen NONE DETECTED NONE DETECTED   Amphetamines, Ur Screen NONE DETECTED NONE DETECTED   MDMA (Ecstasy)Ur Screen NONE DETECTED NONE DETECTED   Cocaine Metabolite,Ur [place] NONE DETECTED NONE DETECTED   Opiate, Ur Screen NONE DETECTED NONE DETECTED   Phencyclidine (PCP) Ur S NONE DETECTED NONE DETECTED   Cannabinoid 50 Ng, Ur [place] POSITIVE (A) NONE DETECTED   Barbiturates, Ur Screen NONE DETECTED NONE DETECTED   Benzodiazepine, Ur Scrn POSITIVE (A) NONE DETECTED   Methadone Scn, Ur NONE DETECTED NONE DETECTED    Comment: (NOTE) Tricyclics + metabolites, urine    Cutoff 1000 ng/mL Amphetamines + metabolites, urine  Cutoff 1000 ng/mL MDMA (Ecstasy), urine              Cutoff 500 ng/mL Cocaine Metabolite, urine          Cutoff 300 ng/mL Opiate + metabolites, urine        Cutoff 300 ng/mL Phencyclidine (PCP), urine         Cutoff 25 ng/mL Cannabinoid, urine                 Cutoff 50 ng/mL Barbiturates + metabolites, urine  Cutoff 200 ng/mL Benzodiazepine, urine              Cutoff 200 ng/mL Methadone, urine                    Cutoff 300 ng/mL  The urine drug screen provides only a preliminary, unconfirmed analytical test result and should not be used for non-medical purposes. Clinical consideration and professional judgment should be applied to any positive drug screen result due to possible interfering substances. A more specific alternate chemical method must be used in order to obtain a confirmed analytical result. Gas chromatography / mass spectrometry (GC/MS) is the preferred confirm atory method. Performed at [facility], [address] [place], [place] [address]   Resp panel by RT-PCR (RSV, Flu A&B, Covid) Anterior Nasal Swab     Status: None   Collection Time: 05/25/24  2:26 PM   Specimen: Anterior Nasal Swab  Result Value Ref Range   SARS Coronavirus 2 by RT PCR NEGATIVE NEGATIVE    Comment: (NOTE) SARS-CoV-2 target nucleic acids are NOT DETECTED.  The SARS-CoV-2 RNA is generally detectable in upper respiratory specimens during the acute phase of infection. The lowest concentration of SARS-CoV-2 viral copies this assay can detect is 138 copies/mL. A negative result does not preclude SARS-Cov-2 infection and should not be used as the sole basis for treatment or other patient management decisions. A negative result may occur with  improper specimen collection/handling, submission of specimen other than nasopharyngeal swab, presence of viral mutation(s) within the areas targeted by this assay, and inadequate number of viral copies(<138 copies/mL). A negative result must be combined with clinical observations, patient history, and epidemiological information. The expected result is Negative.  Fact Sheet for Patients:  bloggercourse.com  Fact Sheet for Healthcare Providers:  seriousbroker.it  This test is no t yet approved or cleared by the [place]  FDA and  has been authorized for detection and/or diagnosis of  SARS-CoV-2 by FDA under an Emergency Use Authorization (EUA). This EUA will remain  in effect (meaning this test can be used) for the duration of the COVID-19 declaration under Section  564(b)(1) of the Act, 21 U.S.C.section 360bbb-3(b)(1), unless the authorization is terminated  or revoked sooner.       Influenza A by PCR NEGATIVE NEGATIVE   Influenza B by PCR NEGATIVE NEGATIVE    Comment: (NOTE) The Xpert Xpress SARS-CoV-2/FLU/RSV plus assay is intended as an aid in the diagnosis of influenza from Nasopharyngeal swab specimens and should not be used as a sole basis for treatment. Nasal washings and aspirates are unacceptable for Xpert Xpress SARS-CoV-2/FLU/RSV testing.  Fact Sheet for Patients: bloggercourse.com  Fact Sheet for Healthcare Providers: seriousbroker.it  This test is not yet approved or cleared by the [place]  FDA and has been authorized for detection and/or diagnosis of SARS-CoV-2 by FDA under an Emergency Use Authorization (EUA). This EUA will remain in effect (meaning this test can be used) for the duration of the COVID-19 declaration under Section 564(b)(1) of the Act, 21 U.S.C. section 360bbb-3(b)(1), unless the authorization is terminated or revoked.     Resp Syncytial Virus by PCR NEGATIVE NEGATIVE    Comment: (NOTE) Fact Sheet for Patients: bloggercourse.com  Fact Sheet for Healthcare Providers: seriousbroker.it  This test is not yet approved or cleared by the [place]  FDA and has been authorized for detection and/or diagnosis of SARS-CoV-2 by FDA under an Emergency Use Authorization (EUA). This EUA will remain in effect (meaning this test can be used) for the duration of the COVID-19 declaration under Section 564(b)(1) of the Act, 21 U.S.C. section 360bbb-3(b)(1), unless the authorization is terminated or revoked.  Performed at [facility], [address] [place], [place] [address]   Lactic acid, plasma     Status: Abnormal   Collection Time: 05/25/24  3:52 PM  Result Value Ref Range   Lactic Acid, Venous 2.3 (HH) 0.5 - 1.9 mmol/L    Comment: CRITICAL RESULT CALLED TO, READ BACK BY AND VERIFIED WITH ASHLEY JACKSON 05/25/24 1639 MU Performed at [facility], [address] [place], [place] [address]   Blood Culture (routine x 2)     Status: None   Collection Time: 05/25/24  3:52 PM   Specimen: BLOOD RIGHT ARM  Result Value Ref Range   Specimen Description BLOOD RIGHT ARM    Special Requests      BOTTLES DRAWN AEROBIC AND ANAEROBIC Blood Culture results may not be optimal due to an inadequate volume of blood received in culture bottles   Culture      NO GROWTH 5 DAYS Performed at [facility], [address], [place], [place] [address]    Report Status 05/30/2024 FINAL   Blood Culture (routine x 2)     Status: None   Collection Time: 05/25/24  3:52 PM   Specimen: BLOOD LEFT ARM  Result Value Ref Range   Specimen Description BLOOD LEFT ARM    Special Requests      BOTTLES DRAWN AEROBIC AND ANAEROBIC Blood Culture adequate volume   Culture      NO GROWTH 5 DAYS Performed at [facility], [address] [place], [place] [address]    Report Status 05/30/2024 FINAL   Lactic acid, plasma     Status: Abnormal   Collection Time: 05/25/24  6:45 PM  Result Value Ref Range   Lactic Acid, Venous 2.2 (HH) 0.5 - 1.9 mmol/L    Comment: CRITICAL VALUE NOTED. VALUE IS CONSISTENT WITH PREVIOUSLY REPORTED/CALLED VALUE SKL Performed at [facility] [address] [place], [place] [address]   HIV Antibody (routine testing  w rflx)     Status: None   Collection Time: 05/25/24  6:45 PM  Result Value Ref Range   HIV Screen 4th Generation wRfx Non Reactive Non Reactive    Comment: Performed at [facility], [address], [place], [place] [address]  Blood gas, venous      Status: None   Collection Time: 05/25/24  6:45 PM  Result Value Ref Range   pH, Ven 7.34 7.25 - 7.43   pCO2, Ven 48 44 - 60 mmHg   pO2, Ven 36 32 - 45 mmHg   Bicarbonate 25.9 20.0 - 28.0 mmol/L   Acid-base deficit 0.4 0.0 - 2.0 mmol/L   O2 Saturation 56.9 %   Patient temperature 37.0    Collection site VEIN     Comment: Performed at [facility], [address] [place], [place] [address]  Basic metabolic panel     Status: Abnormal   Collection Time: 05/26/24  5:42 AM  Result Value Ref Range   Sodium 139 135 - 145 mmol/L   Potassium 3.5 3.5 - 5.1 mmol/L   Chloride 108 98 - 111 mmol/L   CO2 25 22 - 32 mmol/L   Glucose, Bld 103 (H) 70 - 99 mg/dL    Comment: Glucose reference range applies only to samples taken after fasting for at least 8 hours.   BUN 10 6 - 20 mg/dL   Creatinine, Ser 9.41 0.44 - 1.00 mg/dL   Calcium 7.6 (L) 8.9 - 10.3 mg/dL   GFR, Estimated >39 >39 mL/min    Comment: (NOTE) Calculated using the CKD-EPI Creatinine Equation (2021)    Anion gap 6 5 - 15    Comment: Performed at [facility], [address] [place], [place] [address]  CBC     Status: Abnormal   Collection Time: 05/26/24  5:42 AM  Result Value Ref Range   WBC 10.2 4.0 - 10.5 K/uL   RBC 3.17 (L) 3.87 - 5.11 MIL/uL   Hemoglobin 6.8 (L) 12.0 - 15.0 g/dL    Comment: Reticulocyte Hemoglobin testing may be clinically indicated, consider ordering this additional test [id number]    HCT 23.7 (L) 36.0 - 46.0 %   MCV 74.8 (L) 80.0 - 100.0 fL   MCH 21.5 (L) 26.0 - 34.0 pg   MCHC 28.7 (L) 30.0 - 36.0 g/dL   RDW 83.1 (H) 88.4 - 84.4 %   Platelets 225 150 - 400 K/uL   nRBC 0.0 0.0 - 0.2 %    Comment: Performed at [facility], [address] [place], [place] [address]  Glucose, capillary     Status: None   Collection Time: 05/26/24  7:54 AM  Result Value Ref Range   Glucose-Capillary 75 70 - 99 mg/dL    Comment: Glucose reference range applies only to samples taken  after fasting for at least 8 hours.  CBC     Status: Abnormal   Collection Time: 05/26/24  9:26 AM  Result Value Ref Range   WBC 10.9 (H) 4.0 - 10.5 K/uL   RBC 3.29 (L) 3.87 - 5.11 MIL/uL   Hemoglobin 7.2 (L) 12.0 - 15.0 g/dL    Comment: Reticulocyte Hemoglobin testing may be clinically indicated, consider ordering this additional test [id number]    HCT 24.7 (L) 36.0 - 46.0 %   MCV 75.1 (L) 80.0 - 100.0 fL   MCH 21.9 (L) 26.0 - 34.0 pg   MCHC 29.1 (L) 30.0 - 36.0 g/dL  RDW 17.0 (H) 11.5 - 15.5 %   Platelets 227 150 - 400 K/uL   nRBC 0.0 0.0 - 0.2 %    Comment: Performed at [facility], [address] [place], [place] [address]  Hemoglobin A1c     Status: Abnormal   Collection Time: 05/26/24  9:26 AM  Result Value Ref Range   Hgb A1c MFr Bld 5.8 (H) 4.8 - 5.6 %    Comment: (NOTE) Diagnosis of Diabetes The following HbA1c ranges recommended by the American Diabetes Association (ADA) may be used as an aid in the diagnosis of diabetes mellitus.  Hemoglobin             Suggested A1C NGSP%              Diagnosis  <5.7                   Non Diabetic  5.7-6.4                Pre-Diabetic  >6.4                   Diabetic  <7.0                   Glycemic control for                       adults with diabetes.     Mean Plasma Glucose 119.76 mg/dL    Comment: Performed at [facility], [address] [place], [place] [address]      Assessment & Plan:  Azithromycin  Referral sent to GI Referral sent to pulmonary  Problem List Items Addressed This Visit       Respiratory   Community acquired pneumonia - Primary   Relevant Medications   azithromycin  (ZITHROMAX ) 500 MG tablet   Mild intermittent asthma   Relevant Orders   Ambulatory referral to Pulmonology     Digestive   Gastrointestinal hemorrhage   Relevant Orders   Ambulatory referral to Gastroenterology    Return in about 2 weeks (around 06/10/2024) for with [facility].   Total time spent: 25  minutes. This time includes review of previous notes and results and patient face to face interaction during today's visit.    Jeoffrey Pollen, NP  05/27/2024   This document may have been prepared by Dragon Voice Recognition software and as such may include unintentional dictation errors.

## 2024-05-30 LAB — CULTURE, BLOOD (ROUTINE X 2)
Culture: NO GROWTH
Culture: NO GROWTH
Special Requests: ADEQUATE

## 2024-06-01 ENCOUNTER — Ambulatory Visit
Admission: RE | Admit: 2024-06-01 | Discharge: 2024-06-01 | Disposition: A | Discharge status: Home / Self Care | Attending: Family | Admitting: Family

## 2024-06-01 ENCOUNTER — Ambulatory Visit: Admitting: Family

## 2024-06-01 ENCOUNTER — Ambulatory Visit
Admission: RE | Admit: 2024-06-01 | Discharge: 2024-06-01 | Disposition: A | Discharge status: Home / Self Care | Source: Ambulatory Visit | Attending: Family | Admitting: Family

## 2024-06-01 VITALS — BP 128/86 | HR 118 | Ht <= 58 in | Wt 137.6 lb

## 2024-06-01 DIAGNOSIS — F411 Generalized anxiety disorder: Secondary | ICD-10-CM

## 2024-06-01 DIAGNOSIS — Z0130 Encounter for examination of blood pressure without abnormal findings: Secondary | ICD-10-CM

## 2024-06-01 DIAGNOSIS — J189 Pneumonia, unspecified organism: Secondary | ICD-10-CM | POA: Insufficient documentation

## 2024-06-01 DIAGNOSIS — E559 Vitamin D deficiency, unspecified: Secondary | ICD-10-CM | POA: Diagnosis not present

## 2024-06-01 DIAGNOSIS — R5383 Other fatigue: Secondary | ICD-10-CM

## 2024-06-01 DIAGNOSIS — R7303 Prediabetes: Secondary | ICD-10-CM | POA: Diagnosis not present

## 2024-06-01 DIAGNOSIS — E538 Deficiency of other specified B group vitamins: Secondary | ICD-10-CM

## 2024-06-01 DIAGNOSIS — E782 Mixed hyperlipidemia: Secondary | ICD-10-CM | POA: Diagnosis not present

## 2024-06-01 MED ORDER — ALPRAZOLAM 1 MG PO TABS: 1.0000 mg | ORAL_TABLET | Freq: Three times a day (TID) | ORAL | 1 refills | Status: DC | PRN

## 2024-06-02 ENCOUNTER — Other Ambulatory Visit: Payer: Self-pay | Admitting: Family

## 2024-06-02 LAB — COMPREHENSIVE METABOLIC PANEL WITH GFR
ALT: 9 IU/L (ref 0–32)
AST: 13 IU/L (ref 0–40)
Albumin: 4.4 g/dL (ref 3.9–4.9)
Alkaline Phosphatase: 83 IU/L (ref 41–116)
BUN/Creatinine Ratio: 12 (ref 9–23)
BUN: 10 mg/dL (ref 6–24)
Bilirubin Total: 0.2 mg/dL (ref 0.0–1.2)
CO2: 21 mmol/L (ref 20–29)
Calcium: 8.8 mg/dL (ref 8.7–10.2)
Chloride: 104 mmol/L (ref 96–106)
Creatinine, Ser: 0.85 mg/dL (ref 0.57–1.00)
Globulin, Total: 2.8 g/dL (ref 1.5–4.5)
Glucose: 119 mg/dL — ABNORMAL HIGH (ref 70–99)
Potassium: 4.1 mmol/L (ref 3.5–5.2)
Sodium: 142 mmol/L (ref 134–144)
Total Protein: 7.2 g/dL (ref 6.0–8.5)
eGFR: 87 mL/min/1.73 (ref >59)

## 2024-06-02 LAB — CBC WITH DIFFERENTIAL/PLATELET
Basophils Absolute: 0.1 x10E3/uL (ref 0.0–0.2)
Basos: 1 %
EOS (ABSOLUTE): 0.2 x10E3/uL (ref 0.0–0.4)
Eos: 3 %
Hematocrit: 33.2 % — ABNORMAL LOW (ref 34.0–46.6)
Hemoglobin: 9.6 g/dL — ABNORMAL LOW (ref 11.1–15.9)
Immature Grans (Abs): 0.1 x10E3/uL (ref 0.0–0.1)
Immature Granulocytes: 2 %
Lymphocytes Absolute: 0.8 x10E3/uL (ref 0.7–3.1)
Lymphs: 11 %
MCH: 21.7 pg — ABNORMAL LOW (ref 26.6–33.0)
MCHC: 28.9 g/dL — ABNORMAL LOW (ref 31.5–35.7)
MCV: 75 fL — ABNORMAL LOW (ref 79–97)
Monocytes Absolute: 0.3 x10E3/uL (ref 0.1–0.9)
Monocytes: 5 %
Neutrophils Absolute: 5.8 x10E3/uL (ref 1.4–7.0)
Neutrophils: 78 %
Platelets: 427 x10E3/uL (ref 150–450)
RBC: 4.42 x10E6/uL (ref 3.77–5.28)
RDW: 16.4 % — ABNORMAL HIGH (ref 11.7–15.4)
WBC: 7.2 x10E3/uL (ref 3.4–10.8)

## 2024-06-02 LAB — IRON,TIBC AND FERRITIN PANEL
Ferritin: 39 ng/mL (ref 15–150)
Iron Saturation: 5 % — CL (ref 15–55)
Iron: 22 ug/dL — ABNORMAL LOW (ref 27–159)
Total Iron Binding Capacity: 470 ug/dL — ABNORMAL HIGH (ref 250–450)
UIBC: 448 ug/dL — ABNORMAL HIGH (ref 131–425)

## 2024-06-02 LAB — LIPID PANEL
Chol/HDL Ratio: 4.4 ratio (ref 0.0–4.4)
Cholesterol, Total: 181 mg/dL (ref 100–199)
HDL: 41 mg/dL (ref >39)
LDL Chol Calc (NIH): 120 mg/dL — ABNORMAL HIGH (ref 0–99)
Triglycerides: 108 mg/dL (ref 0–149)
VLDL Cholesterol Cal: 20 mg/dL (ref 5–40)

## 2024-06-02 LAB — VITAMIN D 25 HYDROXY (VIT D DEFICIENCY, FRACTURES): Vit D, 25-Hydroxy: 24.4 ng/mL — ABNORMAL LOW (ref 30.0–100.0)

## 2024-06-02 LAB — VITAMIN B12: Vitamin B-12: 275 pg/mL (ref 232–1245)

## 2024-06-02 LAB — HEMOGLOBIN A1C
Est. average glucose Bld gHb Est-mCnc: 120 mg/dL
Hgb A1c MFr Bld: 5.8 % — ABNORMAL HIGH (ref 4.8–5.6)

## 2024-06-02 LAB — TSH: TSH: 0.43 u[IU]/mL — ABNORMAL LOW (ref 0.450–4.500)

## 2024-06-04 ENCOUNTER — Encounter: Payer: Self-pay | Admitting: Family

## 2024-06-04 NOTE — Assessment & Plan Note (Signed)
 Rechecking CXR today.  Will call with results when available.   Reassess as needed at follow up.

## 2024-06-04 NOTE — Assessment & Plan Note (Signed)
 Increasing frequency of her alprazolam  dosing for short term.  Patient will find a therapist that can help her with grief processing and managing what she is dealing with more effectively, to help us  get her anxiety/depression under better control.

## 2024-06-04 NOTE — Progress Notes (Signed)
 Established Patient Office Visit  Subjective:  Patient ID: Karen [name], female    DOB: June 14, 1981  Age: 43 y.o. MRN: [id number]  Chief Complaint  Patient presents with   Follow-up    Follow up    Patient is here today for acute episodes of anxiety due to grief over her father's death.  She has previously been well managed with her anxiety medication, but due to the sudden death of her father, she has been having spikes of her anxiety and is having trouble focusing and managing her emotions, as she helps her mother navigate the changes that she's dealing with due to the loss.   She is very tearful in office today saying that I just don't know what to do, and I'm lost. She does take multiple medications to manage her anxiety and depression, which are at max dose for her currently.   She is having active panic attacks, with shortness of breath, chest pains, palpitations, dizziness, and shakiness.  She has alprazolam  but says that she is having to use it much more frequently due to her current status.       No other concerns at this time.   Past Medical History:  Diagnosis Date   Allergy    Anxiety    Asthma    GERD (gastroesophageal reflux disease)    Internal hemorrhoids    Pneumonia    Primary osteoarthritis of right hip     Past Surgical History:  Procedure Laterality Date   CARPAL TUNNEL RELEASE Bilateral 2010   CESAREAN SECTION  2009   COLONOSCOPY     EPIGASTRIC HERNIA REPAIR N/A 09/03/2015   Procedure: HERNIA REPAIR EPIGASTRIC ADULT;  Surgeon: Reyes LELON Cota, MD;  Location: [facility];  Service: General;  Laterality: N/A;   HIP SURGERY  2013   OVARIAN CYST [facility]GERY  2002   [facility] [place]   TONSILLECTOMY  2010   TOTAL HIP ARTHROPLASTY Right 11/26/2023   Procedure: ARTHROPLASTY, HIP, TOTAL, ANTERIOR APPROACH;  Surgeon: Lorelle Hussar, MD;  Location: [facility];  Service: Orthopedics;  Laterality: Right;    Social History   Socioeconomic History    Marital status: Married    Spouse name: Thammavong,Steven (Significant other)   Number of children: 1   Years of education: Not on file   Highest education level: Not on file  Occupational History   Not on file  Tobacco Use   Smoking status: Former    Current packs/day: 0.00    Average packs/day: 0.5 packs/day for 18.0 years (9.0 ttl pk-yrs)    Types: Cigarettes    Start date: 07/21/1996    Quit date: 07/21/2014    Years since quitting: 9.8   Smokeless tobacco: Never  Vaping Use   Vaping status: Never Used  Substance and Sexual Activity   Alcohol use: No    Alcohol/week: 0.0 standard drinks of alcohol   Drug use: No   Sexual activity: Yes  Other Topics Concern   Not on file  Social History Narrative   Not on file   Social Drivers of Health   Financial Resource Strain: Medium Risk (01/13/2024)   Received from [facility]   Overall Financial Resource Strain (CARDIA)    Difficulty of Paying Living Expenses: Somewhat hard  Food Insecurity: Patient Declined (05/25/2024)   Hunger Vital Sign    Worried About Running Out of Food in the Last Year: Patient declined    Ran Out of Food in the Last Year: Patient  declined  Transportation Needs: Patient Declined (05/25/2024)   PRAPARE - Administrator, Civil Service (Medical): Patient declined    Lack of Transportation (Non-Medical): Patient declined  Physical Activity: Not on file  Stress: Not on file  Social Connections: Not on file  Intimate Partner Violence: Patient Declined (05/25/2024)   Humiliation, Afraid, Rape, and Kick questionnaire    Fear of Current or Ex-Partner: Patient declined    Emotionally Abused: Patient declined    Physically Abused: Patient declined    Sexually Abused: Patient declined    Family History  Problem Relation Age of Onset   COPD Mother    COPD Father    Breast cancer Maternal Aunt     Allergies  Allergen Reactions   Benadryl [Diphenhydramine Hcl] Other (See Comments)     Leg jumpy   Cyclobenzaprine Other (See Comments)     legs jumpy  Restless legs   Ketorolac Hives   Motrin [Ibuprofen] Hives   Ketorolac Tromethamine Rash   Tramadol Rash and Hives    Review of Systems  Psychiatric/Behavioral:  Positive for depression. The patient is nervous/anxious and has insomnia.   All other systems reviewed and are negative.      Objective:   BP 128/86   Pulse (!) 118   Ht 4' 8 (1.422 m)   Wt 137 lb 9.6 oz (62.4 kg)   LMP 09/27/2016   SpO2 96%   BMI 30.85 kg/m   Vitals:   06/01/24 1059  BP: 128/86  Pulse: (!) 118  Height: 4' 8 (1.422 m)  Weight: 137 lb 9.6 oz (62.4 kg)  SpO2: 96%  BMI (Calculated): 30.87    Physical Exam Vitals and nursing note reviewed.  Constitutional:      Appearance: Normal appearance. She is normal weight.  HENT:     Head: Normocephalic.  Eyes:     Extraocular Movements: Extraocular movements intact.     Conjunctiva/sclera: Conjunctivae normal.     Pupils: Pupils are equal, round, and reactive to light.  Cardiovascular:     Rate and Rhythm: Normal rate.  Pulmonary:     Effort: Pulmonary effort is normal.  Neurological:     General: No focal deficit present.     Mental Status: She is alert and oriented to person, place, and time. Mental status is at baseline.  Psychiatric:        Mood and Affect: Mood normal.        Behavior: Behavior normal.        Thought Content: Thought content normal.      Results for orders placed or performed in visit on 06/01/24  CBC with Diff  Result Value Ref Range   WBC 7.2 3.4 - 10.8 x10E3/uL   RBC 4.42 3.77 - 5.28 x10E6/uL   Hemoglobin 9.6 (L) 11.1 - 15.9 g/dL   Hematocrit 66.7 (L) 65.9 - 46.6 %   MCV 75 (L) 79 - 97 fL   MCH 21.7 (L) 26.6 - 33.0 pg   MCHC 28.9 (L) 31.5 - 35.7 g/dL   RDW 83.5 (H) 88.2 - 84.5 %   Platelets 427 150 - 450 x10E3/uL   Neutrophils 78 Not Estab. %   Lymphs 11 Not Estab. %   Monocytes 5 Not Estab. %   Eos 3 Not Estab. %   Basos 1 Not  Estab. %   Neutrophils Absolute 5.8 1.4 - 7.0 x10E3/uL   Lymphocytes Absolute 0.8 0.7 - 3.1 x10E3/uL   Monocytes Absolute 0.3 0.1 -  0.9 x10E3/uL   EOS (ABSOLUTE) 0.2 0.0 - 0.4 x10E3/uL   Basophils Absolute 0.1 0.0 - 0.2 x10E3/uL   Immature Granulocytes 2 Not Estab. %   Immature Grans (Abs) 0.1 0.0 - 0.1 x10E3/uL  Comprehensive metabolic panel with GFR  Result Value Ref Range   Glucose 119 (H) 70 - 99 mg/dL   BUN 10 6 - 24 mg/dL   Creatinine, Ser 9.14 0.57 - 1.00 mg/dL   eGFR 87 >40 fO/fpw/8.26   BUN/Creatinine Ratio 12 9 - 23   Sodium 142 134 - 144 mmol/L   Potassium 4.1 3.5 - 5.2 mmol/L   Chloride 104 96 - 106 mmol/L   CO2 21 20 - 29 mmol/L   Calcium 8.8 8.7 - 10.2 mg/dL   Total Protein 7.2 6.0 - 8.5 g/dL   Albumin 4.4 3.9 - 4.9 g/dL   Globulin, Total 2.8 1.5 - 4.5 g/dL   Bilirubin Total 0.2 0.0 - 1.2 mg/dL   Alkaline Phosphatase 83 41 - 116 IU/L   AST 13 0 - 40 IU/L   ALT 9 0 - 32 IU/L  Lipid Profile  Result Value Ref Range   Cholesterol, Total 181 100 - 199 mg/dL   Triglycerides 891 0 - 149 mg/dL   HDL 41 >60 mg/dL   VLDL Cholesterol Cal 20 5 - 40 mg/dL   LDL Chol Calc (NIH) 879 (H) 0 - 99 mg/dL   Chol/HDL Ratio 4.4 0.0 - 4.4 ratio  Hemoglobin A1c  Result Value Ref Range   Hgb A1c MFr Bld 5.8 (H) 4.8 - 5.6 %   Est. average glucose Bld gHb Est-mCnc 120 mg/dL  Vitamin B12  Result Value Ref Range   Vitamin B-12 275 232 - 1,245 pg/mL  Vitamin D  (25 hydroxy)  Result Value Ref Range   Vit D, 25-Hydroxy 24.4 (L) 30.0 - 100.0 ng/mL  TSH  Result Value Ref Range   TSH 0.430 (L) 0.450 - 4.500 uIU/mL  Iron, TIBC and Ferritin Panel  Result Value Ref Range   Total Iron Binding Capacity 470 (H) 250 - 450 ug/dL   UIBC 551 (H) 868 - 574 ug/dL   Iron 22 (L) 27 - 159 ug/dL   Iron Saturation 5 (LL) 15 - 55 %   Ferritin 39 15 - 150 ng/mL    Recent Results (from the past 2160 hours)  ToxASSURE Select 13 (MW), Urine     Status: None   Collection Time: 04/05/24  3:57 PM  Result  Value Ref Range   Summary FINAL     Comment: ==================================================================== ToxASSURE Select 13 (MW) ==================================================================== Test                             Result       Flag       Units  Drug Present   Oxazepam                       41                      ng/mg creat    Oxazepam may be administered as a scheduled prescription medication;    it is also an expected metabolite of other benzodiazepine drugs,    including diazepam , chlordiazepoxide, prazepam, clorazepate,    halazepam, and temazepam.    Carboxy-THC  293                     ng/mg creat    Carboxy-THC is a metabolite of tetrahydrocannabinol (THC). Source of    THC is most commonly herbal marijuana or marijuana-based products,    but THC is also present in a scheduled prescription medication.    Trace amounts of THC can be present in hemp and cannabidiol (CBD)    products. This test is not intended to distinguish between delta-9-    tetrahydrocannabin ol, the predominant form of THC in most herbal or    marijuana-based products, and delta-8-tetrahydrocannabinol.    Oxycodone                       34                      ng/mg creat   Oxymorphone                    36                      ng/mg creat   Noroxycodone                   382                     ng/mg creat   Noroxymorphone                 162                     ng/mg creat    Sources of oxycodone  are scheduled prescription medications.    Oxymorphone, noroxycodone, and noroxymorphone are expected    metabolites of oxycodone . Oxymorphone is also available as a    scheduled prescription medication.    Buprenorphine                  235                     ng/mg creat   Norbuprenorphine               >437                    ng/mg creat    Source of buprenorphine is a scheduled prescription medication.    Norbuprenorphine is an expected metabolite of  buprenorphine.  ==================================================================== Test                       Result    Flag   Units      Ref Range   Creatinine              229              mg/dL      >=79 ==================================================================== Declared Medications:  Medication list was not provided. ==================================================================== For clinical consultation, please call [phone]. ====================================================================   Urinalysis, w/ Reflex to Culture (Infection Suspected) -Urine, Clean Catch     Status: Abnormal   Collection Time: 05/13/24  6:36 PM  Result Value Ref Range   Specimen Source URINE, CLEAN CATCH    Color, Urine AMBER (A) YELLOW    Comment: BIOCHEMICALS MAY BE AFFECTED BY COLOR   APPearance HAZY (A) CLEAR   Specific Gravity, Urine 1.025 1.005 - 1.030  pH 5.5 5.0 - 8.0   Glucose, UA NEGATIVE NEGATIVE mg/dL   Hgb urine dipstick MODERATE (A) NEGATIVE   Bilirubin Urine NEGATIVE NEGATIVE   Ketones, ur NEGATIVE NEGATIVE mg/dL   Protein, ur NEGATIVE NEGATIVE mg/dL   Nitrite NEGATIVE NEGATIVE   Leukocytes,Ua NEGATIVE NEGATIVE   Squamous Epithelial / HPF 11-20 0 - 5 /HPF   WBC, UA 0-5 0 - 5 WBC/hpf    Comment: Reflex urine culture not performed if WBC <=10, OR if Squamous epithelial cells >5. If Squamous epithelial cells >5, suggest recollection.   RBC / HPF 11-20 0 - 5 RBC/hpf   Bacteria, UA MANY (A) NONE SEEN   Mucus PRESENT     Comment: Performed at [facility], [address], [place], [place] [address]  Urine Culture     Status: None   Collection Time: 05/13/24  6:36 PM   Specimen: Urine, Clean Catch  Result Value Ref Range   Specimen Description      URINE, CLEAN CATCH Performed at [facility], [address] [place], [place] [address]    Special Requests      NONE Performed at [facility], [address], [place], [place] [address]    Culture      NO GROWTH Performed at [facility], [address], [place], [place] [address]    Report Status 05/14/2024 FINAL   Lipase, blood     Status: None   Collection Time: 05/25/24  9:28 AM  Result Value Ref Range   Lipase 27 11 - 51 U/L    Comment: Performed at [facility], [address] [place], [place] [address]  Comprehensive metabolic panel     Status: Abnormal   Collection Time: 05/25/24  9:28 AM  Result Value Ref Range   Sodium 141 135 - 145 mmol/L   Potassium 3.9 3.5 - 5.1 mmol/L   Chloride 107 98 - 111 mmol/L   CO2 21 (L) 22 - 32 mmol/L   Glucose, Bld 142 (H) 70 - 99 mg/dL    Comment: Glucose reference range applies only to samples taken after fasting for at least 8 hours.   BUN 15 6 - 20 mg/dL   Creatinine, Ser 9.18 0.44 - 1.00 mg/dL   Calcium 8.4 (L) 8.9 - 10.3 mg/dL   Total Protein 7.0 6.5 - 8.1 g/dL   Albumin 3.6 3.5 - 5.0 g/dL   AST 20 15 - 41 U/L   ALT 13 0 - 44 U/L   Alkaline Phosphatase 72 38 - 126 U/L   Total Bilirubin 0.4 0.0 - 1.2 mg/dL   GFR, Estimated >39 >39 mL/min    Comment: (NOTE) Calculated using the CKD-EPI Creatinine Equation (2021)    Anion gap 13 5 - 15    Comment: Performed at [facility], [address] [place], [place] [address]  CBC     Status: Abnormal   Collection Time: 05/25/24  9:28 AM  Result Value Ref Range   WBC 7.1 4.0 - 10.5 K/uL   RBC 3.99 3.87 - 5.11 MIL/uL   Hemoglobin 8.8 (L) 12.0 - 15.0 g/dL    Comment: Reticulocyte Hemoglobin testing may be clinically indicated, consider ordering this additional test [id number]    HCT 29.9 (L) 36.0 - 46.0 %   MCV 74.9 (L) 80.0 - 100.0 fL   MCH 22.1 (L) 26.0 - 34.0 pg   MCHC 29.4 (L) 30.0 - 36.0 g/dL   RDW  16.8 (H) 11.5 - 15.5 %   Platelets 287 150 - 400 K/uL   nRBC 0.0 0.0 - 0.2 %    Comment: Performed at [facility], [address] [place], [place] [address]  Ethanol     Status: None   Collection Time:  05/25/24  9:28 AM  Result Value Ref Range   Alcohol, Ethyl (B) <15 <15 mg/dL    Comment: (NOTE) For medical purposes only. Performed at [facility], [address] [place], [place] [address]   Acetaminophen  level     Status: Abnormal   Collection Time: 05/25/24  9:28 AM  Result Value Ref Range   Acetaminophen  (Tylenol ), Serum <10 (L) 10 - 30 ug/mL    Comment: (NOTE) Therapeutic concentrations vary significantly. A range of 10-30 ug/mL  may be an effective concentration for many patients. However, some  are best treated at concentrations outside of this range. Acetaminophen  concentrations >150 ug/mL at 4 hours after ingestion  and >50 ug/mL at 12 hours after ingestion are often associated with  toxic reactions.  Performed at [facility], [address], [place], [place] [address]   Salicylate level     Status: Abnormal   Collection Time: 05/25/24  9:28 AM  Result Value Ref Range   Salicylate Lvl <7.0 (L) 7.0 - 30.0 mg/dL    Comment: Performed at [facility], [address] [place], [place] [address]  Brain natriuretic peptide     Status: None   Collection Time: 05/25/24  9:28 AM  Result Value Ref Range   B Natriuretic Peptide 63.9 0.0 - 100.0 pg/mL    Comment: Performed at [facility], [address] [place], [place] [address]  Troponin I (High Sensitivity)     Status: None   Collection Time: 05/25/24  9:28 AM  Result Value Ref Range   Troponin I (High Sensitivity) 4 <18 ng/L    Comment: (NOTE) Elevated high sensitivity troponin I (hsTnI) values and significant  changes across serial measurements may suggest ACS but many other  chronic and acute conditions are known to elevate hsTnI results.  Refer to the Links section for chest pain algorithms and additional  guidance. Performed at [facility], [address] [place], [place] [address]   Procalcitonin     Status: None   Collection Time: 05/25/24  9:28 AM   Result Value Ref Range   Procalcitonin <0.10 ng/mL    Comment:        Interpretation: PCT (Procalcitonin) <= 0.5 ng/mL: Systemic infection (sepsis) is not likely. Local bacterial infection is possible. (NOTE)       Sepsis PCT Algorithm           Lower Respiratory Tract                                      Infection PCT Algorithm    ----------------------------     ----------------------------         PCT < 0.25 ng/mL                PCT < 0.10 ng/mL          Strongly encourage             Strongly discourage   discontinuation of antibiotics    initiation of antibiotics    ----------------------------     -----------------------------       PCT  0.25 - 0.50 ng/mL            PCT 0.10 - 0.25 ng/mL               OR       >80% decrease in PCT            Discourage initiation of                                            antibiotics      Encourage discontinuation           of antibiotics    ----------------------------     -----------------------------         PCT >= 0.50 ng/mL              PCT 0.26 - 0.50 ng/mL               AND        <80% decrease in PCT             Encourage initiation of                                             antibiotics       Encourage continuation           of antibiotics    ----------------------------     -----------------------------        PCT >= 0.50 ng/mL                  PCT > 0.50 ng/mL               AND         increase in PCT                  Strongly encourage                                      initiation of antibiotics    Strongly encourage escalation           of antibiotics                                     -----------------------------                                           PCT <= 0.25 ng/mL                                                 OR                                        > 80% decrease in PCT  Discontinue / Do not initiate                                              antibiotics  Performed at [facility], [address] [place], [place] [address]   Magnesium     Status: None   Collection Time: 05/25/24  9:28 AM  Result Value Ref Range   Magnesium 1.7 1.7 - 2.4 mg/dL    Comment: Performed at [facility], [address] [place], [place] [address]  Urinalysis, Routine w reflex microscopic -Urine, Clean Catch     Status: Abnormal   Collection Time: 05/25/24 11:33 AM  Result Value Ref Range   Color, Urine STRAW (A) YELLOW   APPearance CLEAR (A) CLEAR   Specific Gravity, Urine 1.012 1.005 - 1.030   pH 5.0 5.0 - 8.0   Glucose, UA NEGATIVE NEGATIVE mg/dL   Hgb urine dipstick NEGATIVE NEGATIVE   Bilirubin Urine NEGATIVE NEGATIVE   Ketones, ur NEGATIVE NEGATIVE mg/dL   Protein, ur NEGATIVE NEGATIVE mg/dL   Nitrite NEGATIVE NEGATIVE   Leukocytes,Ua NEGATIVE NEGATIVE    Comment: Performed at [facility], [address] [place], [place] [address]  Urine Drug Screen, Qualitative ([facility] only)     Status: Abnormal   Collection Time: 05/25/24 11:33 AM  Result Value Ref Range   Tricyclic, Ur Screen NONE DETECTED NONE DETECTED   Amphetamines, Ur Screen NONE DETECTED NONE DETECTED   MDMA (Ecstasy)Ur Screen NONE DETECTED NONE DETECTED   Cocaine Metabolite,Ur [place] NONE DETECTED NONE DETECTED   Opiate, Ur Screen NONE DETECTED NONE DETECTED   Phencyclidine (PCP) Ur S NONE DETECTED NONE DETECTED   Cannabinoid 50 Ng, Ur [place] POSITIVE (A) NONE DETECTED   Barbiturates, Ur Screen NONE DETECTED NONE DETECTED   Benzodiazepine, Ur Scrn POSITIVE (A) NONE DETECTED   Methadone Scn, Ur NONE DETECTED NONE DETECTED    Comment: (NOTE) Tricyclics + metabolites, urine    Cutoff 1000 ng/mL Amphetamines + metabolites, urine  Cutoff 1000 ng/mL MDMA (Ecstasy), urine              Cutoff 500 ng/mL Cocaine Metabolite, urine          Cutoff 300 ng/mL Opiate + metabolites, urine        Cutoff 300 ng/mL Phencyclidine (PCP), urine         Cutoff 25  ng/mL Cannabinoid, urine                 Cutoff 50 ng/mL Barbiturates + metabolites, urine  Cutoff 200 ng/mL Benzodiazepine, urine              Cutoff 200 ng/mL Methadone, urine                   Cutoff 300 ng/mL  The urine drug screen provides only a preliminary, unconfirmed analytical test result and should not be used for non-medical purposes. Clinical consideration and professional judgment should be applied to any positive drug screen result due to possible interfering substances. A more specific alternate chemical method must be used in order to obtain a confirmed analytical result. Gas chromatography / mass spectrometry (GC/MS) is the preferred confirm atory method. Performed at [facility], [address] [place], [place] [address]   Resp panel by RT-PCR (RSV, Flu A&B, Covid) Anterior Nasal Swab     Status: None  Collection Time: 05/25/24  2:26 PM   Specimen: Anterior Nasal Swab  Result Value Ref Range   SARS Coronavirus 2 by RT PCR NEGATIVE NEGATIVE    Comment: (NOTE) SARS-CoV-2 target nucleic acids are NOT DETECTED.  The SARS-CoV-2 RNA is generally detectable in upper respiratory specimens during the acute phase of infection. The lowest concentration of SARS-CoV-2 viral copies this assay can detect is 138 copies/mL. A negative result does not preclude SARS-Cov-2 infection and should not be used as the sole basis for treatment or other patient management decisions. A negative result may occur with  improper specimen collection/handling, submission of specimen other than nasopharyngeal swab, presence of viral mutation(s) within the areas targeted by this assay, and inadequate number of viral copies(<138 copies/mL). A negative result must be combined with clinical observations, patient history, and epidemiological information. The expected result is Negative.  Fact Sheet for Patients:  bloggercourse.com  Fact Sheet for Healthcare  Providers:  seriousbroker.it  This test is no t yet approved or cleared by the [place]  FDA and  has been authorized for detection and/or diagnosis of SARS-CoV-2 by FDA under an Emergency Use Authorization (EUA). This EUA will remain  in effect (meaning this test can be used) for the duration of the COVID-19 declaration under Section 564(b)(1) of the Act, 21 U.S.C.section 360bbb-3(b)(1), unless the authorization is terminated  or revoked sooner.       Influenza A by PCR NEGATIVE NEGATIVE   Influenza B by PCR NEGATIVE NEGATIVE    Comment: (NOTE) The Xpert Xpress SARS-CoV-2/FLU/RSV plus assay is intended as an aid in the diagnosis of influenza from Nasopharyngeal swab specimens and should not be used as a sole basis for treatment. Nasal washings and aspirates are unacceptable for Xpert Xpress SARS-CoV-2/FLU/RSV testing.  Fact Sheet for Patients: bloggercourse.com  Fact Sheet for Healthcare Providers: seriousbroker.it  This test is not yet approved or cleared by the [place]  FDA and has been authorized for detection and/or diagnosis of SARS-CoV-2 by FDA under an Emergency Use Authorization (EUA). This EUA will remain in effect (meaning this test can be used) for the duration of the COVID-19 declaration under Section 564(b)(1) of the Act, 21 U.S.C. section 360bbb-3(b)(1), unless the authorization is terminated or revoked.     Resp Syncytial Virus by PCR NEGATIVE NEGATIVE    Comment: (NOTE) Fact Sheet for Patients: bloggercourse.com  Fact Sheet for Healthcare Providers: seriousbroker.it  This test is not yet approved or cleared by the [place]  FDA and has been authorized for detection and/or diagnosis of SARS-CoV-2 by FDA under an Emergency Use Authorization (EUA). This EUA will remain in effect (meaning this test can be used) for the  duration of the COVID-19 declaration under Section 564(b)(1) of the Act, 21 U.S.C. section 360bbb-3(b)(1), unless the authorization is terminated or revoked.  Performed at [facility], [address] [place], [place] [address]   Lactic acid, plasma     Status: Abnormal   Collection Time: 05/25/24  3:52 PM  Result Value Ref Range   Lactic Acid, Venous 2.3 (HH) 0.5 - 1.9 mmol/L    Comment: CRITICAL RESULT CALLED TO, READ BACK BY AND VERIFIED WITH ASHLEY JACKSON 05/25/24 1639 MU Performed at [facility], [address] [place], [place] [address]   Blood Culture (routine x 2)     Status: None   Collection Time: 05/25/24  3:52 PM   Specimen: BLOOD RIGHT ARM  Result Value Ref Range   Specimen Description BLOOD RIGHT ARM  Special Requests      BOTTLES DRAWN AEROBIC AND ANAEROBIC Blood Culture results may not be optimal due to an inadequate volume of blood received in culture bottles   Culture      NO GROWTH 5 DAYS Performed at [facility], [address] [place], [place] [address]    Report Status 05/30/2024 FINAL   Blood Culture (routine x 2)     Status: None   Collection Time: 05/25/24  3:52 PM   Specimen: BLOOD LEFT ARM  Result Value Ref Range   Specimen Description BLOOD LEFT ARM    Special Requests      BOTTLES DRAWN AEROBIC AND ANAEROBIC Blood Culture adequate volume   Culture      NO GROWTH 5 DAYS Performed at [facility], [address] [place], [place] [address]    Report Status 05/30/2024 FINAL   Lactic acid, plasma     Status: Abnormal   Collection Time: 05/25/24  6:45 PM  Result Value Ref Range   Lactic Acid, Venous 2.2 (HH) 0.5 - 1.9 mmol/L    Comment: CRITICAL VALUE NOTED. VALUE IS CONSISTENT WITH PREVIOUSLY REPORTED/CALLED VALUE SKL Performed at [facility], [address] [place], [place] [address]   HIV Antibody (routine testing w rflx)     Status: None   Collection Time: 05/25/24  6:45 PM  Result  Value Ref Range   HIV Screen 4th Generation wRfx Non Reactive Non Reactive    Comment: Performed at [facility], [address], [place], [place] [address]  Blood gas, venous     Status: None   Collection Time: 05/25/24  6:45 PM  Result Value Ref Range   pH, Ven 7.34 7.25 - 7.43   pCO2, Ven 48 44 - 60 mmHg   pO2, Ven 36 32 - 45 mmHg   Bicarbonate 25.9 20.0 - 28.0 mmol/L   Acid-base deficit 0.4 0.0 - 2.0 mmol/L   O2 Saturation 56.9 %   Patient temperature 37.0    Collection site VEIN     Comment: Performed at [facility], [address] [place], [place] [address]  Basic metabolic panel     Status: Abnormal   Collection Time: 05/26/24  5:42 AM  Result Value Ref Range   Sodium 139 135 - 145 mmol/L   Potassium 3.5 3.5 - 5.1 mmol/L   Chloride 108 98 - 111 mmol/L   CO2 25 22 - 32 mmol/L   Glucose, Bld 103 (H) 70 - 99 mg/dL    Comment: Glucose reference range applies only to samples taken after fasting for at least 8 hours.   BUN 10 6 - 20 mg/dL   Creatinine, Ser 9.41 0.44 - 1.00 mg/dL   Calcium 7.6 (L) 8.9 - 10.3 mg/dL   GFR, Estimated >39 >39 mL/min    Comment: (NOTE) Calculated using the CKD-EPI Creatinine Equation (2021)    Anion gap 6 5 - 15    Comment: Performed at [facility], [address] [place], [place] [address]  CBC     Status: Abnormal   Collection Time: 05/26/24  5:42 AM  Result Value Ref Range   WBC 10.2 4.0 - 10.5 K/uL   RBC 3.17 (L) 3.87 - 5.11 MIL/uL   Hemoglobin 6.8 (L) 12.0 - 15.0 g/dL    Comment: Reticulocyte Hemoglobin testing may be clinically indicated, consider ordering this additional test [id number]    HCT 23.7 (L) 36.0 - 46.0 %   MCV 74.8 (  L) 80.0 - 100.0 fL   MCH 21.5 (L) 26.0 - 34.0 pg   MCHC 28.7 (L) 30.0 - 36.0 g/dL   RDW 83.1 (H) 88.4 - 84.4 %   Platelets 225 150 - 400 K/uL   nRBC 0.0 0.0 - 0.2 %    Comment: Performed at [facility], [address] [place], [place] [address]  Glucose, capillary      Status: None   Collection Time: 05/26/24  7:54 AM  Result Value Ref Range   Glucose-Capillary 75 70 - 99 mg/dL    Comment: Glucose reference range applies only to samples taken after fasting for at least 8 hours.  CBC     Status: Abnormal   Collection Time: 05/26/24  9:26 AM  Result Value Ref Range   WBC 10.9 (H) 4.0 - 10.5 K/uL   RBC 3.29 (L) 3.87 - 5.11 MIL/uL   Hemoglobin 7.2 (L) 12.0 - 15.0 g/dL    Comment: Reticulocyte Hemoglobin testing may be clinically indicated, consider ordering this additional test [id number]    HCT 24.7 (L) 36.0 - 46.0 %   MCV 75.1 (L) 80.0 - 100.0 fL   MCH 21.9 (L) 26.0 - 34.0 pg   MCHC 29.1 (L) 30.0 - 36.0 g/dL   RDW 82.9 (H) 88.4 - 84.4 %   Platelets 227 150 - 400 K/uL   nRBC 0.0 0.0 - 0.2 %    Comment: Performed at [facility], [address] [place], [place] [address]  Hemoglobin A1c     Status: Abnormal   Collection Time: 05/26/24  9:26 AM  Result Value Ref Range   Hgb A1c MFr Bld 5.8 (H) 4.8 - 5.6 %    Comment: (NOTE) Diagnosis of Diabetes The following HbA1c ranges recommended by the American Diabetes Association (ADA) may be used as an aid in the diagnosis of diabetes mellitus.  Hemoglobin             Suggested A1C NGSP%              Diagnosis  <5.7                   Non Diabetic  5.7-6.4                Pre-Diabetic  >6.4                   Diabetic  <7.0                   Glycemic control for                       adults with diabetes.     Mean Plasma Glucose 119.76 mg/dL    Comment: Performed at [facility], [address], [place], [place] [address]  CBC with Diff     Status: Abnormal   Collection Time: 06/01/24 11:48 AM  Result Value Ref Range   WBC 7.2 3.4 - 10.8 x10E3/uL   RBC 4.42 3.77 - 5.28 x10E6/uL   Hemoglobin 9.6 (L) 11.1 - 15.9 g/dL   Hematocrit 66.7 (L) 65.9 - 46.6 %   MCV 75 (L) 79 - 97 fL   MCH 21.7 (L) 26.6 - 33.0 pg   MCHC 28.9 (L) 31.5 - 35.7 g/dL   RDW 83.5 (H) 88.2 - 84.5 %    Platelets 427 150 - 450 x10E3/uL   Neutrophils 78 Not Estab. %   Lymphs 11  Not Estab. %   Monocytes 5 Not Estab. %   Eos 3 Not Estab. %   Basos 1 Not Estab. %   Neutrophils Absolute 5.8 1.4 - 7.0 x10E3/uL   Lymphocytes Absolute 0.8 0.7 - 3.1 x10E3/uL   Monocytes Absolute 0.3 0.1 - 0.9 x10E3/uL   EOS (ABSOLUTE) 0.2 0.0 - 0.4 x10E3/uL   Basophils Absolute 0.1 0.0 - 0.2 x10E3/uL   Immature Granulocytes 2 Not Estab. %   Immature Grans (Abs) 0.1 0.0 - 0.1 x10E3/uL  Comprehensive metabolic panel with GFR     Status: Abnormal   Collection Time: 06/01/24 11:48 AM  Result Value Ref Range   Glucose 119 (H) 70 - 99 mg/dL   BUN 10 6 - 24 mg/dL   Creatinine, Ser 9.14 0.57 - 1.00 mg/dL   eGFR 87 >40 fO/fpw/8.26   BUN/Creatinine Ratio 12 9 - 23   Sodium 142 134 - 144 mmol/L   Potassium 4.1 3.5 - 5.2 mmol/L   Chloride 104 96 - 106 mmol/L   CO2 21 20 - 29 mmol/L   Calcium 8.8 8.7 - 10.2 mg/dL   Total Protein 7.2 6.0 - 8.5 g/dL   Albumin 4.4 3.9 - 4.9 g/dL   Globulin, Total 2.8 1.5 - 4.5 g/dL   Bilirubin Total 0.2 0.0 - 1.2 mg/dL   Alkaline Phosphatase 83 41 - 116 IU/L   AST 13 0 - 40 IU/L   ALT 9 0 - 32 IU/L  Lipid Profile     Status: Abnormal   Collection Time: 06/01/24 11:48 AM  Result Value Ref Range   Cholesterol, Total 181 100 - 199 mg/dL   Triglycerides 891 0 - 149 mg/dL   HDL 41 >60 mg/dL   VLDL Cholesterol Cal 20 5 - 40 mg/dL   LDL Chol Calc (NIH) 879 (H) 0 - 99 mg/dL   Chol/HDL Ratio 4.4 0.0 - 4.4 ratio    Comment:                                   T. Chol/HDL Ratio                                             Men  Women                               1/2 Avg.Risk  3.4    3.3                                   Avg.Risk  5.0    4.4                                2X Avg.Risk  9.6    7.1                                3X Avg.Risk 23.4   11.0   Hemoglobin A1c     Status: Abnormal   Collection Time: 06/01/24 11:48 AM  Result Value Ref Range   Hgb A1c MFr Bld 5.8 (H) 4.8 -  5.6 %     Comment:          Prediabetes: 5.7 - 6.4          Diabetes: >6.4          Glycemic control for adults with diabetes: <7.0    Est. average glucose Bld gHb Est-mCnc 120 mg/dL  Vitamin B12     Status: None   Collection Time: 06/01/24 11:48 AM  Result Value Ref Range   Vitamin B-12 275 232 - 1,245 pg/mL  Vitamin D  (25 hydroxy)     Status: Abnormal   Collection Time: 06/01/24 11:48 AM  Result Value Ref Range   Vit D, 25-Hydroxy 24.4 (L) 30.0 - 100.0 ng/mL    Comment: Vitamin D  deficiency has been defined by the Institute of Medicine and an Endocrine Society practice guideline as a level of serum 25-OH vitamin D  less than 20 ng/mL (1,2). The Endocrine Society went on to further define vitamin D  insufficiency as a level between 21 and 29 ng/mL (2). 1. IOM (Institute of Medicine). 2010. Dietary reference    intakes for calcium and D. [place]: The    [employer]. 2. Holick MF, Binkley [place], Bischoff-Ferrari HA, et al.    Evaluation, treatment, and prevention of vitamin D     deficiency: an Endocrine Society clinical practice    guideline. JCEM. 2011 Jul; 96(7):1911-30.   TSH     Status: Abnormal   Collection Time: 06/01/24 11:48 AM  Result Value Ref Range   TSH 0.430 (L) 0.450 - 4.500 uIU/mL  Iron, TIBC and Ferritin Panel     Status: Abnormal   Collection Time: 06/01/24 11:48 AM  Result Value Ref Range   Total Iron Binding Capacity 470 (H) 250 - 450 ug/dL   UIBC 551 (H) 868 - 574 ug/dL   Iron 22 (L) 27 - 159 ug/dL   Iron Saturation 5 (LL) 15 - 55 %   Ferritin 39 15 - 150 ng/mL       Assessment & Plan Anxiety state Increasing frequency of her alprazolam  dosing for short term.  Patient will find a therapist that can help her with grief processing and managing what she is dealing with more effectively, to help us  get her anxiety/depression under better control.   Prediabetes A1C Continues to be in prediabetic ranges.  Will reassess at follow up after next lab  check.  Patient counseled on dietary choices and verbalized understanding.   -CBC w/Diff -CMP w/eGFR -Hemoglobin A1C  Mixed hyperlipidemia Checking labs today.  Continue current therapy for lipid control. Will modify as needed based on labwork results.   -CMP w/eGFR -Lipid Panel  Vitamin D  deficiency, unspecified Other fatigue B12 deficiency due to diet Checking labs today.  Will continue supplements as needed.   - Vitamin D  - Vitamin B12 - TSH  Community acquired pneumonia, unspecified laterality Rechecking CXR today.  Will call with results when available.   Reassess as needed at follow up.     Return in about 2 weeks (around 06/15/2024).   Total time spent: 30 minutes  ALAN CHRISTELLA ARRANT, FNP  06/01/2024   This document may have been prepared by [facility] Voice Recognition software and as such may include unintentional dictation errors.

## 2024-06-06 ENCOUNTER — Ambulatory Visit: Admitting: Family

## 2024-06-07 ENCOUNTER — Ambulatory Visit: Admitting: Family

## 2024-06-09 DIAGNOSIS — F4320 Adjustment disorder, unspecified: Secondary | ICD-10-CM | POA: Insufficient documentation

## 2024-06-13 ENCOUNTER — Encounter: Payer: Self-pay | Admitting: Family

## 2024-06-13 NOTE — Patient Instructions (Signed)

## 2024-06-13 NOTE — Progress Notes (Unsigned)
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 sedation  L.A.Local Anesthetic  Steroid benefit  Ongoing benefit)   Provider-requested follow-up: No follow-ups on file.  Future Appointments  Date Time Provider Department Center  06/15/2024 10:00 AM Tanya Glisson, MD [facility] None  06/30/2024  9:30 AM Tamea Dedra CROME, MD [facility] [id number]  07/01/2024  1:00 PM Orlean Alan HERO, FNP [facility] None   I discussed the assessment and treatment plan with the patient. The patient was provided an opportunity to ask questions and all were answered. The patient agreed with the plan and demonstrated an understanding of the instructions.  Patient  advised to call back or seek an in-person evaluation if the symptoms or condition worsens.  Duration of encounter: *** minutes.  Total time on encounter, as per AMA guidelines included both the face-to-face and non-face-to-face time personally spent by the physician and/or other qualified health care professional(s) on the day of the encounter (includes time in activities that require the physician or other qualified health care professional and does not include time in activities normally performed by clinical staff). Physician's time may include the following activities when performed: Preparing to see the patient (e.g., pre-charting review of records, searching for previously ordered imaging, lab work, and nerve conduction tests) Review of prior analgesic pharmacotherapies. Reviewing PMP Interpreting ordered tests (e.g., lab work,  imaging, nerve conduction tests) Performing post-procedure evaluations, including interpretation of diagnostic procedures Obtaining and/or reviewing separately obtained history Performing a medically appropriate examination and/or evaluation Counseling and educating the patient/family/caregiver Ordering medications, tests, or procedures Referring and communicating with other health care professionals (when not separately reported) Documenting clinical information in the electronic or other health record Independently interpreting results (not separately reported) and communicating results to the patient/ family/caregiver Care coordination (not separately reported)  Note by: Eric DELENA Como, MD (TTS and AI technology used. I apologize for any typographical errors that were not detected and corrected.) Date: 06/15/2024; Time: 12:15 PM

## 2024-06-15 ENCOUNTER — Encounter: Payer: Self-pay | Admitting: Pain Medicine

## 2024-06-15 ENCOUNTER — Ambulatory Visit: Admitting: Pain Medicine

## 2024-06-15 DIAGNOSIS — Z789 Other specified health status: Secondary | ICD-10-CM

## 2024-06-15 DIAGNOSIS — G8929 Other chronic pain: Secondary | ICD-10-CM

## 2024-06-15 DIAGNOSIS — T50905S Adverse effect of unspecified drugs, medicaments and biological substances, sequela: Secondary | ICD-10-CM

## 2024-06-15 DIAGNOSIS — Z79899 Other long term (current) drug therapy: Secondary | ICD-10-CM

## 2024-06-15 DIAGNOSIS — Z91199 Patient's noncompliance with other medical treatment and regimen due to unspecified reason: Secondary | ICD-10-CM

## 2024-06-15 DIAGNOSIS — F1190 Opioid use, unspecified, uncomplicated: Secondary | ICD-10-CM

## 2024-06-15 DIAGNOSIS — G894 Chronic pain syndrome: Secondary | ICD-10-CM

## 2024-06-15 DIAGNOSIS — M899 Disorder of bone, unspecified: Secondary | ICD-10-CM

## 2024-06-22 ENCOUNTER — Ambulatory Visit: Admitting: Family

## 2024-06-23 ENCOUNTER — Other Ambulatory Visit: Payer: Self-pay | Admitting: Family

## 2024-06-24 ENCOUNTER — Ambulatory Visit: Payer: Self-pay

## 2024-06-30 ENCOUNTER — Ambulatory Visit: Admitting: Pulmonary Disease

## 2024-07-01 ENCOUNTER — Encounter: Payer: Self-pay | Admitting: Family

## 2024-07-01 ENCOUNTER — Ambulatory Visit: Admitting: Family

## 2024-07-01 VITALS — BP 112/64 | HR 92 | Ht <= 58 in | Wt 140.0 lb

## 2024-07-01 DIAGNOSIS — I509 Heart failure, unspecified: Secondary | ICD-10-CM | POA: Diagnosis not present

## 2024-07-01 DIAGNOSIS — K5903 Drug induced constipation: Secondary | ICD-10-CM

## 2024-07-01 DIAGNOSIS — Z9189 Other specified personal risk factors, not elsewhere classified: Secondary | ICD-10-CM | POA: Diagnosis not present

## 2024-07-01 DIAGNOSIS — Z0130 Encounter for examination of blood pressure without abnormal findings: Secondary | ICD-10-CM

## 2024-07-01 DIAGNOSIS — F411 Generalized anxiety disorder: Secondary | ICD-10-CM

## 2024-07-01 DIAGNOSIS — F4320 Adjustment disorder, unspecified: Secondary | ICD-10-CM

## 2024-07-01 DIAGNOSIS — F32A Depression, unspecified: Secondary | ICD-10-CM

## 2024-07-01 MED ORDER — LINACLOTIDE 145 MCG PO CAPS: 145.0000 ug | ORAL_CAPSULE | Freq: Every day | ORAL | 3 refills | Status: AC

## 2024-07-01 MED ORDER — BREXPIPRAZOLE 1 MG PO TABS: 1.0000 mg | ORAL_TABLET | Freq: Every day | ORAL | 1 refills | Status: AC

## 2024-07-01 NOTE — Telephone Encounter (Signed)
 Pt seen in office today.

## 2024-07-01 NOTE — Patient Instructions (Addendum)
 SABRA

## 2024-07-02 ENCOUNTER — Emergency Department: Admission: EM | Admit: 2024-07-02 | Discharge: 2024-07-02 | Disposition: A | Discharge status: Home / Self Care

## 2024-07-02 ENCOUNTER — Emergency Department

## 2024-07-02 ENCOUNTER — Other Ambulatory Visit: Payer: Self-pay

## 2024-07-02 ENCOUNTER — Encounter: Payer: Self-pay | Admitting: Family

## 2024-07-02 DIAGNOSIS — M542 Cervicalgia: Secondary | ICD-10-CM | POA: Insufficient documentation

## 2024-07-02 DIAGNOSIS — R079 Chest pain, unspecified: Secondary | ICD-10-CM | POA: Insufficient documentation

## 2024-07-02 DIAGNOSIS — R519 Headache, unspecified: Secondary | ICD-10-CM | POA: Insufficient documentation

## 2024-07-02 DIAGNOSIS — Y92531 Health care provider office as the place of occurrence of the external cause: Secondary | ICD-10-CM | POA: Insufficient documentation

## 2024-07-02 DIAGNOSIS — W19XXXA Unspecified fall, initial encounter: Secondary | ICD-10-CM

## 2024-07-02 DIAGNOSIS — W01198A Fall on same level from slipping, tripping and stumbling with subsequent striking against other object, initial encounter: Secondary | ICD-10-CM | POA: Insufficient documentation

## 2024-07-02 MED ORDER — ALPRAZOLAM 0.5 MG PO TABS
0.5000 mg | ORAL_TABLET | Freq: Once | ORAL | Status: AC
  Administered 2024-07-02: 0.5 mg via ORAL
  Filled 2024-07-02: qty 1

## 2024-07-02 MED ORDER — ACETAMINOPHEN 325 MG PO TABS
650.0000 mg | ORAL_TABLET | Freq: Once | ORAL | Status: DC
  Filled 2024-07-02: qty 2

## 2024-07-02 NOTE — ED Notes (Signed)
 Pt wandering the hallway. Found in a room going through cabinets. Pt directed back to bed. Medication ordered given.

## 2024-07-02 NOTE — ED Notes (Signed)
 Pt refused d/c vitals signs and d/c paperwork.

## 2024-07-02 NOTE — ED Notes (Signed)
 RN called to pt in lobby by other patients, who stated pt sat in the floor. Pt noted to be sitting up with buttocks on floor. Pt previously walking around lobby asking if name called. Pt assisted back to w/c. Reminded to raise hand or alert staff if need assistance, not to get up without assistance. NAD noted.

## 2024-07-02 NOTE — ED Notes (Signed)
 Patient irate in the hallway, stating she is leaving  I need my ativan, and nobody is helping me, I'll leave and go to [facility]. Patient pacing, decline tylenol . Patient currently speaking with charge nurse.

## 2024-07-02 NOTE — ED Notes (Signed)
 This RN was notified by radiography pt need to use the bathroom. RN came to assist patient as she is a fall risk. As RN was approaching the bed the jumped up and stated I can't wait, I need to go to the bathroom. Patient ambulated with steady gait unassisted to the bathroom.

## 2024-07-02 NOTE — ED Triage Notes (Signed)
 Pt to ED for 2 falls yesterday while at doctor's office. She was on the exam table and fell off somehow. From each side of the table. States having pain to whole body. No obvious injuries noted. Pt has Butrans (buprenorphine) patch to R upper arm.  Pt also takes aprazolam TID. Pt appears drowsy.

## 2024-07-02 NOTE — ED Provider Notes (Signed)
 [facility] Provider Note    Event Date/Time   First MD Initiated Contact with Patient 07/02/24 1943     (approximate)   History   Fall   HPI  Karen [name] is a 43 y.o. female presenting to the emergency department complaining of pain everywhere after having 2 falls at her primary care physician's office yesterday.  Patient reports that she fell off of both sides of the bed.  She states that an ambulance was called but that she decided not to come to the ER.  She reports pain to her head, neck, back, chest, abdomen, bilateral arms, and bilateral legs.     Physical Exam   Triage Vital Signs: ED Triage Vitals  Encounter Vitals Group     BP 07/02/24 1755 106/68     Girls Systolic BP Percentile --      Girls Diastolic BP Percentile --      Boys Systolic BP Percentile --      Boys Diastolic BP Percentile --      Pulse Rate 07/02/24 1755 82     Resp 07/02/24 1755 20     Temp 07/02/24 1755 98.2 F (36.8 C)     Temp Source 07/02/24 1755 Oral     SpO2 07/02/24 1755 100 %     Weight 07/02/24 1758 139 lb 15.9 oz (63.5 kg)     Height 07/02/24 1758 4' 8 (1.422 m)     Head Circumference --      Peak Flow --      Pain Score 07/02/24 1757 8     Pain Loc --      Pain Education --      Exclude from Growth Chart --     Most recent vital signs: Vitals:   07/02/24 1755  BP: 106/68  Pulse: 82  Resp: 20  Temp: 98.2 F (36.8 C)  SpO2: 100%     General: Awake, no distress.  CV:  Good peripheral perfusion.  Resp:  Normal effort.  Abd:  No distention.  Nontender to palpation Other:  Normal gait.  No bruising or abrasions noted   ED Results / Procedures / Treatments   Labs (all labs ordered are listed, but only abnormal results are displayed) Labs Reviewed - No data to display    RADIOLOGY By my interpretation the patient's lumbar x-ray does not show any acute findings.  Radiology interpretation agrees.  CT of head and neck do not show  any acute findings.    PROCEDURES:  Critical Care performed: No  Procedures   MEDICATIONS ORDERED IN ED: Medications - No data to display   IMPRESSION / MDM / ASSESSMENT AND PLAN / ED COURSE  I reviewed the triage vital signs and the nursing notes.                               Patient's presentation is most consistent with acute, uncomplicated illness.  This patient is a 43 year old female presenting to the emergency department complaining of pain everywhere after 2 falls at her PCPs office yesterday.  Patient's physical exam is overall unremarkable and extremely reassuring.  Tylenol  ordered for pain which the patient refused.  CT of the head and neck ordered as she reports that she did hit her head and is having a headache.  She is also complaining of lower back pain so an x-ray will be performed.  While awaiting results the  patient demanded nursing staff to give her her home Xanax  prescription.  Patient threatened to leave AMA if it was not given to her.  After discussion is reported to the patient that I would provide her with Xanax  for her anxiety which she does take at home.  The patient has left her bed multiple times.  She was found going through drawers in empty patient rooms.  Discussed results with the patient and discussed plan for discharge.  The patient then left without receiving any of her discharge paperwork or receiving any of her medications.      FINAL CLINICAL IMPRESSION(S) / ED DIAGNOSES   Final diagnoses:  Fall, initial encounter     Rx / DC Orders   ED Discharge Orders     None        Note:  This document was prepared using Dragon voice recognition software and may include unintentional dictation errors.   Preslea Rhodus M, MD 07/02/24 2145

## 2024-07-17 ENCOUNTER — Encounter: Payer: Self-pay | Admitting: Family

## 2024-07-18 NOTE — Telephone Encounter (Signed)
 Noted per Alan*

## 2024-07-22 ENCOUNTER — Encounter: Payer: Self-pay | Admitting: Family

## 2024-07-25 ENCOUNTER — Encounter: Payer: Self-pay | Admitting: Family

## 2024-07-26 ENCOUNTER — Encounter: Payer: Self-pay | Admitting: Family

## 2024-07-26 MED ORDER — ALPRAZOLAM ER 1 MG PO TB24: 1.0000 mg | ORAL_TABLET | Freq: Two times a day (BID) | ORAL | 0 refills | Status: DC

## 2024-07-29 ENCOUNTER — Other Ambulatory Visit

## 2024-08-01 ENCOUNTER — Encounter: Payer: Self-pay | Admitting: Family

## 2024-08-01 ENCOUNTER — Ambulatory Visit
Admission: RE | Admit: 2024-08-01 | Discharge: 2024-08-01 | Disposition: A | Source: Ambulatory Visit | Attending: Family | Admitting: Family

## 2024-08-01 ENCOUNTER — Ambulatory Visit
Admission: RE | Admit: 2024-08-01 | Discharge: 2024-08-01 | Disposition: A | Discharge status: Home / Self Care | Attending: Family | Admitting: Family

## 2024-08-01 ENCOUNTER — Ambulatory Visit: Admitting: Family

## 2024-08-01 VITALS — BP 110/70 | HR 97 | Ht <= 58 in | Wt 141.4 lb

## 2024-08-01 DIAGNOSIS — M25561 Pain in right knee: Secondary | ICD-10-CM

## 2024-08-01 MED ORDER — OXYCODONE HCL 5 MG PO TABS: 5.0000 mg | ORAL_TABLET | Freq: Four times a day (QID) | ORAL | 0 refills | Status: AC | PRN

## 2024-08-01 NOTE — Patient Instructions (Addendum)
 [facility] - ADHD evaluation - referral was sent in October   [address] [place], [place] [address]  Phone: [phone]

## 2024-08-01 NOTE — Progress Notes (Unsigned)
 "  Established Patient Office Visit  Subjective:  Patient ID: Karen [name], female    DOB: 1981/01/11  Age: 44 y.o. MRN: [id number]  Chief Complaint  Patient presents with   Follow-up    1 month follow up    HPI  No other concerns at this time.   Past Medical History:  Diagnosis Date   Allergy    Anxiety    Asthma    GERD (gastroesophageal reflux disease)    Internal hemorrhoids    Pneumonia    Primary osteoarthritis of right hip     Past Surgical History:  Procedure Laterality Date   CARPAL TUNNEL RELEASE Bilateral 2010   CESAREAN SECTION  2009   COLONOSCOPY     EPIGASTRIC HERNIA REPAIR N/A 09/03/2015   Procedure: HERNIA REPAIR EPIGASTRIC ADULT;  Surgeon: Reyes LELON Cota, MD;  Location: [facility] ORS;  Service: General;  Laterality: N/A;   HIP SURGERY  2013   OVARIAN CYST [facility]GERY  2002   [facility] [place]   TONSILLECTOMY  2010   TOTAL HIP ARTHROPLASTY Right 11/26/2023   Procedure: ARTHROPLASTY, HIP, TOTAL, ANTERIOR APPROACH;  Surgeon: Lorelle Hussar, MD;  Location: [facility];  Service: Orthopedics;  Laterality: Right;    Social History   Socioeconomic History   Marital status: Married    Spouse name: Chai,Steven (Significant other)   Number of children: 1   Years of education: Not on file   Highest education level: Not on file  Occupational History   Not on file  Tobacco Use   Smoking status: Former    Current packs/day: 0.00    Average packs/day: 0.5 packs/day for 18.0 years (9.0 ttl pk-yrs)    Types: Cigarettes    Start date: 07/21/1996    Quit date: 07/21/2014    Years since quitting: 10.0   Smokeless tobacco: Never  Vaping Use   Vaping status: Never Used  Substance and Sexual Activity   Alcohol use: No    Alcohol/week: 0.0 standard drinks of alcohol   Drug use: Not Currently    Comment: is on buprenorphine   Sexual activity: Yes  Other Topics Concern   Not on file  Social History Narrative   Not on file   Social Drivers of Health    Tobacco Use: Medium Risk (08/01/2024)   Patient History    Smoking Tobacco Use: Former    Smokeless Tobacco Use: Never    Passive Exposure: Not on Actuary Strain: Low Risk  (07/12/2024)   Received from [facility]   Overall Financial Resource Strain (CARDIA)    Difficulty of Paying Living Expenses: Not very hard  Food Insecurity: No Food Insecurity (07/12/2024)   Received from [facility]   Epic    Within the past 12 months, you worried that your food would run out before you got the money to buy more.: Never true    Within the past 12 months, the food you bought just didn't last and you didn't have money to get more.: Never true  Transportation Needs: No Transportation Needs (07/12/2024)   Received from [facility] - Transportation    In the past 12 months, has lack of transportation kept you from medical appointments or from getting medications?: No    Lack of Transportation (Non-Medical): No  Physical Activity: Not on file  Stress: Not on file  Social Connections: Not on file  Intimate Partner Violence: Patient Declined (05/25/2024)  Epic    Fear of Current or Ex-Partner: Patient declined    Emotionally Abused: Patient declined    Physically Abused: Patient declined    Sexually Abused: Patient declined  Depression (PHQ2-9): Low Risk (09/17/2023)   Depression (PHQ2-9)    PHQ-2 Score: 2  Alcohol Screen: Not on file  Housing: Low Risk  (07/12/2024)   Received from [facility]   Epic    In the last 12 months, was there a time when you were not able to pay the mortgage or rent on time?: No    In the past 12 months, how many times have you moved where you were living?: 0    At any time in the past 12 months, were you homeless or living in a shelter (including now)?: No  Utilities: Not At Risk (07/12/2024)   Received from [facility]   Epic    In the past 12  months has the electric, gas, oil, or water company threatened to shut off services in your home?: No  Health Literacy: Not on file    Family History  Problem Relation Age of Onset   COPD Mother    COPD Father    Breast cancer Maternal Aunt     Allergies[1]  Review of Systems  All other systems reviewed and are negative.      Objective:   BP 110/70   Pulse 97   Ht 4' 8 (1.422 m)   Wt 141 lb 6.4 oz (64.1 kg)   SpO2 98%   BMI 31.70 kg/m   Vitals:   08/01/24 1043  BP: 110/70  Pulse: 97  Height: 4' 8 (1.422 m)  Weight: 141 lb 6.4 oz (64.1 kg)  SpO2: 98%  BMI (Calculated): 31.72    Physical Exam Vitals and nursing note reviewed.  Constitutional:      Appearance: Normal appearance. She is normal weight.  HENT:     Head: Normocephalic.  Eyes:     Extraocular Movements: Extraocular movements intact.     Conjunctiva/sclera: Conjunctivae normal.     Pupils: Pupils are equal, round, and reactive to light.  Cardiovascular:     Rate and Rhythm: Normal rate.  Pulmonary:     Effort: Pulmonary effort is normal.  Neurological:     General: No focal deficit present.     Mental Status: She is alert and oriented to person, place, and time. Mental status is at baseline.  Psychiatric:        Mood and Affect: Mood normal.        Behavior: Behavior normal.        Thought Content: Thought content normal.      No results found for any visits on 08/01/24.  Recent Results (from the past 2160 hours)  Urinalysis, w/ Reflex to Culture (Infection Suspected) -Urine, Clean Catch     Status: Abnormal   Collection Time: 05/13/24  6:36 PM  Result Value Ref Range   Specimen Source URINE, CLEAN CATCH    Color, Urine AMBER (A) YELLOW    Comment: BIOCHEMICALS MAY BE AFFECTED BY COLOR   APPearance HAZY (A) CLEAR   Specific Gravity, Urine 1.025 1.005 - 1.030   pH 5.5 5.0 - 8.0   Glucose, UA NEGATIVE NEGATIVE mg/dL   Hgb urine dipstick MODERATE (A) NEGATIVE   Bilirubin Urine  NEGATIVE NEGATIVE   Ketones, ur NEGATIVE NEGATIVE mg/dL   Protein, ur NEGATIVE NEGATIVE mg/dL   Nitrite NEGATIVE NEGATIVE   Leukocytes,Ua NEGATIVE  NEGATIVE   Squamous Epithelial / HPF 11-20 0 - 5 /HPF   WBC, UA 0-5 0 - 5 WBC/hpf    Comment: Reflex urine culture not performed if WBC <=10, OR if Squamous epithelial cells >5. If Squamous epithelial cells >5, suggest recollection.   RBC / HPF 11-20 0 - 5 RBC/hpf   Bacteria, UA MANY (A) NONE SEEN   Mucus PRESENT     Comment: Performed at [facility], [address], [place], [place] [address]  Urine Culture     Status: None   Collection Time: 05/13/24  6:36 PM   Specimen: Urine, Clean Catch  Result Value Ref Range   Specimen Description      URINE, CLEAN CATCH Performed at [facility], [address], [place], [place] [address]    Special Requests      NONE Performed at [facility], [address], [place], [place] [address]    Culture      NO GROWTH Performed at [facility], [address] [place], [place] [address]    Report Status 05/14/2024 FINAL   Lipase, blood     Status: None   Collection Time: 05/25/24  9:28 AM  Result Value Ref Range   Lipase 27 11 - 51 U/L    Comment: Performed at [facility], [address] [place], [place] [address]  Comprehensive metabolic panel     Status: Abnormal   Collection Time: 05/25/24  9:28 AM  Result Value Ref Range   Sodium 141 135 - 145 mmol/L   Potassium 3.9 3.5 - 5.1 mmol/L   Chloride 107 98 - 111 mmol/L   CO2 21 (L) 22 - 32 mmol/L   Glucose, Bld 142 (H) 70 - 99 mg/dL    Comment: Glucose reference range applies only to samples taken after fasting for at least 8 hours.   BUN 15 6 - 20 mg/dL   Creatinine, Ser 9.18 0.44 - 1.00 mg/dL   Calcium 8.4 (L) 8.9 - 10.3 mg/dL   Total Protein 7.0 6.5 - 8.1 g/dL   Albumin 3.6 3.5 - 5.0 g/dL   AST 20 15 - 41 U/L   ALT 13 0 - 44 U/L   Alkaline Phosphatase 72 38 - 126 U/L   Total  Bilirubin 0.4 0.0 - 1.2 mg/dL   GFR, Estimated >39 >39 mL/min    Comment: (NOTE) Calculated using the CKD-EPI Creatinine Equation (2021)    Anion gap 13 5 - 15    Comment: Performed at [facility], [address] [place], [place] [address]  CBC     Status: Abnormal   Collection Time: 05/25/24  9:28 AM  Result Value Ref Range   WBC 7.1 4.0 - 10.5 K/uL   RBC 3.99 3.87 - 5.11 MIL/uL   Hemoglobin 8.8 (L) 12.0 - 15.0 g/dL    Comment: Reticulocyte Hemoglobin testing may be clinically indicated, consider ordering this additional test [id number]    HCT 29.9 (L) 36.0 - 46.0 %   MCV 74.9 (L) 80.0 - 100.0 fL   MCH 22.1 (L) 26.0 - 34.0 pg   MCHC 29.4 (L) 30.0 - 36.0 g/dL   RDW 83.1 (H) 88.4 - 84.4 %   Platelets 287 150 - 400 K/uL   nRBC 0.0 0.0 - 0.2 %    Comment: Performed at [facility], [address] [place], [place] [address]  Ethanol     Status: None   Collection  Time: 05/25/24  9:28 AM  Result Value Ref Range   Alcohol, Ethyl (B) <15 <15 mg/dL    Comment: (NOTE) For medical purposes only. Performed at [facility], [address] [place], [place] [address]   Acetaminophen  level     Status: Abnormal   Collection Time: 05/25/24  9:28 AM  Result Value Ref Range   Acetaminophen  (Tylenol ), Serum <10 (L) 10 - 30 ug/mL    Comment: (NOTE) Therapeutic concentrations vary significantly. A range of 10-30 ug/mL  may be an effective concentration for many patients. However, some  are best treated at concentrations outside of this range. Acetaminophen  concentrations >150 ug/mL at 4 hours after ingestion  and >50 ug/mL at 12 hours after ingestion are often associated with  toxic reactions.  Performed at [facility], [address] [place], [place] [address]   Salicylate level     Status: Abnormal   Collection Time: 05/25/24  9:28 AM  Result Value Ref Range   Salicylate Lvl <7.0 (L) 7.0 - 30.0 mg/dL    Comment: Performed at [facility], [address] [place], [place] [address]  Brain natriuretic peptide     Status: None   Collection Time: 05/25/24  9:28 AM  Result Value Ref Range   B Natriuretic Peptide 63.9 0.0 - 100.0 pg/mL    Comment: Performed at [facility], [address] [place], [place] [address]  Troponin I (High Sensitivity)     Status: None   Collection Time: 05/25/24  9:28 AM  Result Value Ref Range   Troponin I (High Sensitivity) 4 <18 ng/L    Comment: (NOTE) Elevated high sensitivity troponin I (hsTnI) values and significant  changes across serial measurements may suggest ACS but many other  chronic and acute conditions are known to elevate hsTnI results.  Refer to the Links section for chest pain algorithms and additional  guidance. Performed at [facility], [address] [place], [place] [address]   Procalcitonin     Status: None   Collection Time: 05/25/24  9:28 AM  Result Value Ref Range   Procalcitonin <0.10 ng/mL    Comment:        Interpretation: PCT (Procalcitonin) <= 0.5 ng/mL: Systemic infection (sepsis) is not likely. Local bacterial infection is possible. (NOTE)       Sepsis PCT Algorithm           Lower Respiratory Tract                                      Infection PCT Algorithm    ----------------------------     ----------------------------         PCT < 0.25 ng/mL                PCT < 0.10 ng/mL          Strongly encourage             Strongly discourage   discontinuation of antibiotics    initiation of antibiotics    ----------------------------     -----------------------------       PCT 0.25 - 0.50 ng/mL            PCT 0.10 - 0.25 ng/mL               OR       >80% decrease in PCT  Discourage initiation of                                            antibiotics      Encourage discontinuation           of antibiotics    ----------------------------     -----------------------------         PCT >= 0.50 ng/mL               PCT 0.26 - 0.50 ng/mL               AND        <80% decrease in PCT             Encourage initiation of                                             antibiotics       Encourage continuation           of antibiotics    ----------------------------     -----------------------------        PCT >= 0.50 ng/mL                  PCT > 0.50 ng/mL               AND         increase in PCT                  Strongly encourage                                      initiation of antibiotics    Strongly encourage escalation           of antibiotics                                     -----------------------------                                           PCT <= 0.25 ng/mL                                                 OR                                        > 80% decrease in PCT                                      Discontinue / Do not initiate  antibiotics  Performed at [facility], [address] [place], [place] [address]   Magnesium      Status: None   Collection Time: 05/25/24  9:28 AM  Result Value Ref Range   Magnesium  1.7 1.7 - 2.4 mg/dL    Comment: Performed at [facility], [address] [place], [place] [address]  Urinalysis, Routine w reflex microscopic -Urine, Clean Catch     Status: Abnormal   Collection Time: 05/25/24 11:33 AM  Result Value Ref Range   Color, Urine STRAW (A) YELLOW   APPearance CLEAR (A) CLEAR   Specific Gravity, Urine 1.012 1.005 - 1.030   pH 5.0 5.0 - 8.0   Glucose, UA NEGATIVE NEGATIVE mg/dL   Hgb urine dipstick NEGATIVE NEGATIVE   Bilirubin Urine NEGATIVE NEGATIVE   Ketones, ur NEGATIVE NEGATIVE mg/dL   Protein, ur NEGATIVE NEGATIVE mg/dL   Nitrite NEGATIVE NEGATIVE   Leukocytes,Ua NEGATIVE NEGATIVE    Comment: Performed at [facility], [address], [place], [place] [address]  Urine Drug Screen, Qualitative ([facility] only)     Status: Abnormal   Collection Time: 05/25/24  11:33 AM  Result Value Ref Range   Tricyclic, Ur Screen NONE DETECTED NONE DETECTED   Amphetamines, Ur Screen NONE DETECTED NONE DETECTED   MDMA (Ecstasy)Ur Screen NONE DETECTED NONE DETECTED   Cocaine Metabolite,Ur [place] NONE DETECTED NONE DETECTED   Opiate, Ur Screen NONE DETECTED NONE DETECTED   Phencyclidine (PCP) Ur S NONE DETECTED NONE DETECTED   Cannabinoid 50 Ng, Ur [place] POSITIVE (A) NONE DETECTED   Barbiturates, Ur Screen NONE DETECTED NONE DETECTED   Benzodiazepine, Ur Scrn POSITIVE (A) NONE DETECTED   Methadone Scn, Ur NONE DETECTED NONE DETECTED    Comment: (NOTE) Tricyclics + metabolites, urine    Cutoff 1000 ng/mL Amphetamines + metabolites, urine  Cutoff 1000 ng/mL MDMA (Ecstasy), urine              Cutoff 500 ng/mL Cocaine Metabolite, urine          Cutoff 300 ng/mL Opiate + metabolites, urine        Cutoff 300 ng/mL Phencyclidine (PCP), urine         Cutoff 25 ng/mL Cannabinoid, urine                 Cutoff 50 ng/mL Barbiturates + metabolites, urine  Cutoff 200 ng/mL Benzodiazepine, urine              Cutoff 200 ng/mL Methadone, urine                   Cutoff 300 ng/mL  The urine drug screen provides only a preliminary, unconfirmed analytical test result and should not be used for non-medical purposes. Clinical consideration and professional judgment should be applied to any positive drug screen result due to possible interfering substances. A more specific alternate chemical method must be used in order to obtain a confirmed analytical result. Gas chromatography / mass spectrometry (GC/MS) is the preferred confirm atory method. Performed at [facility], [address] [place], [place] [address]   Resp panel by RT-PCR (RSV, Flu A&B, Covid) Anterior Nasal Swab     Status: None   Collection Time: 05/25/24  2:26 PM   Specimen: Anterior Nasal Swab  Result Value Ref Range   SARS Coronavirus 2 by RT PCR NEGATIVE NEGATIVE    Comment: (NOTE) SARS-CoV-2 target  nucleic acids are NOT DETECTED.  The SARS-CoV-2 RNA is generally detectable in  upper respiratory specimens during the acute phase of infection. The lowest concentration of SARS-CoV-2 viral copies this assay can detect is 138 copies/mL. A negative result does not preclude SARS-Cov-2 infection and should not be used as the sole basis for treatment or other patient management decisions. A negative result may occur with  improper specimen collection/handling, submission of specimen other than nasopharyngeal swab, presence of viral mutation(s) within the areas targeted by this assay, and inadequate number of viral copies(<138 copies/mL). A negative result must be combined with clinical observations, patient history, and epidemiological information. The expected result is Negative.  Fact Sheet for Patients:  bloggercourse.com  Fact Sheet for Healthcare Providers:  seriousbroker.it  This test is no t yet approved or cleared by the [place]  FDA and  has been authorized for detection and/or diagnosis of SARS-CoV-2 by FDA under an Emergency Use Authorization (EUA). This EUA will remain  in effect (meaning this test can be used) for the duration of the COVID-19 declaration under Section 564(b)(1) of the Act, 21 U.S.C.section 360bbb-3(b)(1), unless the authorization is terminated  or revoked sooner.       Influenza A by PCR NEGATIVE NEGATIVE   Influenza B by PCR NEGATIVE NEGATIVE    Comment: (NOTE) The Xpert Xpress SARS-CoV-2/FLU/RSV plus assay is intended as an aid in the diagnosis of influenza from Nasopharyngeal swab specimens and should not be used as a sole basis for treatment. Nasal washings and aspirates are unacceptable for Xpert Xpress SARS-CoV-2/FLU/RSV testing.  Fact Sheet for Patients: bloggercourse.com  Fact Sheet for Healthcare Providers: seriousbroker.it  This test  is not yet approved or cleared by the [place]  FDA and has been authorized for detection and/or diagnosis of SARS-CoV-2 by FDA under an Emergency Use Authorization (EUA). This EUA will remain in effect (meaning this test can be used) for the duration of the COVID-19 declaration under Section 564(b)(1) of the Act, 21 U.S.C. section 360bbb-3(b)(1), unless the authorization is terminated or revoked.     Resp Syncytial Virus by PCR NEGATIVE NEGATIVE    Comment: (NOTE) Fact Sheet for Patients: bloggercourse.com  Fact Sheet for Healthcare Providers: seriousbroker.it  This test is not yet approved or cleared by the [place]  FDA and has been authorized for detection and/or diagnosis of SARS-CoV-2 by FDA under an Emergency Use Authorization (EUA). This EUA will remain in effect (meaning this test can be used) for the duration of the COVID-19 declaration under Section 564(b)(1) of the Act, 21 U.S.C. section 360bbb-3(b)(1), unless the authorization is terminated or revoked.  Performed at [facility], [address] [place], [place] [address]   Lactic acid, plasma     Status: Abnormal   Collection Time: 05/25/24  3:52 PM  Result Value Ref Range   Lactic Acid, Venous 2.3 (HH) 0.5 - 1.9 mmol/L    Comment: CRITICAL RESULT CALLED TO, READ BACK BY AND VERIFIED WITH ASHLEY JACKSON 05/25/24 1639 MU Performed at [facility], [address] [place], [place] [address]   Blood Culture (routine x 2)     Status: None   Collection Time: 05/25/24  3:52 PM   Specimen: BLOOD RIGHT ARM  Result Value Ref Range   Specimen Description BLOOD RIGHT ARM    Special Requests      BOTTLES DRAWN AEROBIC AND ANAEROBIC Blood Culture results may not be optimal due to an inadequate volume of blood received in culture bottles   Culture      NO GROWTH 5 DAYS Performed at [facility], [address] [place], [place]  [address]    Report Status 05/30/2024 FINAL   Blood Culture (routine x 2)     Status: None   Collection Time: 05/25/24  3:52 PM   Specimen: BLOOD LEFT ARM  Result Value Ref Range   Specimen Description BLOOD LEFT ARM    Special Requests      BOTTLES DRAWN AEROBIC AND ANAEROBIC Blood Culture adequate volume   Culture      NO GROWTH 5 DAYS Performed at [facility], [address] [place], [place] [address]    Report Status 05/30/2024 FINAL   Lactic acid, plasma     Status: Abnormal   Collection Time: 05/25/24  6:45 PM  Result Value Ref Range   Lactic Acid, Venous 2.2 (HH) 0.5 - 1.9 mmol/L    Comment: CRITICAL VALUE NOTED. VALUE IS CONSISTENT WITH PREVIOUSLY REPORTED/CALLED VALUE SKL Performed at [facility], [address] [place], [place] [address]   HIV Antibody (routine testing w rflx)     Status: None   Collection Time: 05/25/24  6:45 PM  Result Value Ref Range   HIV Screen 4th Generation wRfx Non Reactive Non Reactive    Comment: Performed at [facility], [address], [place], [place] [address]  Blood gas, venous     Status: None   Collection Time: 05/25/24  6:45 PM  Result Value Ref Range   pH, Ven 7.34 7.25 - 7.43   pCO2, Ven 48 44 - 60 mmHg   pO2, Ven 36 32 - 45 mmHg   Bicarbonate 25.9 20.0 - 28.0 mmol/L   Acid-base deficit 0.4 0.0 - 2.0 mmol/L   O2 Saturation 56.9 %   Patient temperature 37.0    Collection site VEIN     Comment: Performed at [facility], [address] [place], [place] [address]  Basic metabolic panel     Status: Abnormal   Collection Time: 05/26/24  5:42 AM  Result Value Ref Range   Sodium 139 135 - 145 mmol/L   Potassium 3.5 3.5 - 5.1 mmol/L   Chloride 108 98 - 111 mmol/L   CO2 25 22 - 32 mmol/L   Glucose, Bld 103 (H) 70 - 99 mg/dL    Comment: Glucose reference range applies only to samples taken after fasting for at least 8 hours.   BUN 10 6 - 20 mg/dL   Creatinine, Ser 9.41 0.44 - 1.00 mg/dL    Calcium 7.6 (L) 8.9 - 10.3 mg/dL   GFR, Estimated >39 >39 mL/min    Comment: (NOTE) Calculated using the CKD-EPI Creatinine Equation (2021)    Anion gap 6 5 - 15    Comment: Performed at [facility], [address] [place], [place] [address]  CBC     Status: Abnormal   Collection Time: 05/26/24  5:42 AM  Result Value Ref Range   WBC 10.2 4.0 - 10.5 K/uL   RBC 3.17 (L) 3.87 - 5.11 MIL/uL   Hemoglobin 6.8 (L) 12.0 - 15.0 g/dL    Comment: Reticulocyte Hemoglobin testing may be clinically indicated, consider ordering this additional test [id number]    HCT 23.7 (L) 36.0 - 46.0 %   MCV 74.8 (L) 80.0 - 100.0 fL   MCH 21.5 (L) 26.0 - 34.0 pg   MCHC 28.7 (L) 30.0 - 36.0 g/dL   RDW 83.1 (H) 88.4 - 84.4 %   Platelets 225 150 - 400 K/uL   nRBC 0.0 0.0 -  0.2 %    Comment: Performed at [facility], [address] [place], [place] [address]  Glucose, capillary     Status: None   Collection Time: 05/26/24  7:54 AM  Result Value Ref Range   Glucose-Capillary 75 70 - 99 mg/dL    Comment: Glucose reference range applies only to samples taken after fasting for at least 8 hours.  CBC     Status: Abnormal   Collection Time: 05/26/24  9:26 AM  Result Value Ref Range   WBC 10.9 (H) 4.0 - 10.5 K/uL   RBC 3.29 (L) 3.87 - 5.11 MIL/uL   Hemoglobin 7.2 (L) 12.0 - 15.0 g/dL    Comment: Reticulocyte Hemoglobin testing may be clinically indicated, consider ordering this additional test [id number]    HCT 24.7 (L) 36.0 - 46.0 %   MCV 75.1 (L) 80.0 - 100.0 fL   MCH 21.9 (L) 26.0 - 34.0 pg   MCHC 29.1 (L) 30.0 - 36.0 g/dL   RDW 82.9 (H) 88.4 - 84.4 %   Platelets 227 150 - 400 K/uL   nRBC 0.0 0.0 - 0.2 %    Comment: Performed at [facility], [address] [place], [place] [address]  Hemoglobin A1c     Status: Abnormal   Collection Time: 05/26/24  9:26 AM  Result Value Ref Range   Hgb A1c MFr Bld 5.8 (H) 4.8 - 5.6 %    Comment: (NOTE) Diagnosis of Diabetes The  following HbA1c ranges recommended by the American Diabetes Association (ADA) may be used as an aid in the diagnosis of diabetes mellitus.  Hemoglobin             Suggested A1C NGSP%              Diagnosis  <5.7                   Non Diabetic  5.7-6.4                Pre-Diabetic  >6.4                   Diabetic  <7.0                   Glycemic control for                       adults with diabetes.     Mean Plasma Glucose 119.76 mg/dL    Comment: Performed at [facility], [address], [place], [place] [address]  CBC with Diff     Status: Abnormal   Collection Time: 06/01/24 11:48 AM  Result Value Ref Range   WBC 7.2 3.4 - 10.8 x10E3/uL   RBC 4.42 3.77 - 5.28 x10E6/uL   Hemoglobin 9.6 (L) 11.1 - 15.9 g/dL   Hematocrit 66.7 (L) 65.9 - 46.6 %   MCV 75 (L) 79 - 97 fL   MCH 21.7 (L) 26.6 - 33.0 pg   MCHC 28.9 (L) 31.5 - 35.7 g/dL   RDW 83.5 (H) 88.2 - 84.5 %   Platelets 427 150 - 450 x10E3/uL   Neutrophils 78 Not Estab. %   Lymphs 11 Not Estab. %   Monocytes 5 Not Estab. %   Eos 3 Not Estab. %   Basos 1 Not Estab. %   Neutrophils Absolute 5.8 1.4 - 7.0 x10E3/uL   Lymphocytes Absolute 0.8 0.7 - 3.1 x10E3/uL   Monocytes Absolute 0.3  0.1 - 0.9 x10E3/uL   EOS (ABSOLUTE) 0.2 0.0 - 0.4 x10E3/uL   Basophils Absolute 0.1 0.0 - 0.2 x10E3/uL   Immature Granulocytes 2 Not Estab. %   Immature Grans (Abs) 0.1 0.0 - 0.1 x10E3/uL  Comprehensive metabolic panel with GFR     Status: Abnormal   Collection Time: 06/01/24 11:48 AM  Result Value Ref Range   Glucose 119 (H) 70 - 99 mg/dL   BUN 10 6 - 24 mg/dL   Creatinine, Ser 9.14 0.57 - 1.00 mg/dL   eGFR 87 >40 fO/fpw/8.26   BUN/Creatinine Ratio 12 9 - 23   Sodium 142 134 - 144 mmol/L   Potassium 4.1 3.5 - 5.2 mmol/L   Chloride 104 96 - 106 mmol/L   CO2 21 20 - 29 mmol/L   Calcium 8.8 8.7 - 10.2 mg/dL   Total Protein 7.2 6.0 - 8.5 g/dL   Albumin 4.4 3.9 - 4.9 g/dL   Globulin, Total 2.8 1.5 - 4.5 g/dL   Bilirubin Total 0.2  0.0 - 1.2 mg/dL   Alkaline Phosphatase 83 41 - 116 IU/L   AST 13 0 - 40 IU/L   ALT 9 0 - 32 IU/L  Lipid Profile     Status: Abnormal   Collection Time: 06/01/24 11:48 AM  Result Value Ref Range   Cholesterol, Total 181 100 - 199 mg/dL   Triglycerides 891 0 - 149 mg/dL   HDL 41 >60 mg/dL   VLDL Cholesterol Cal 20 5 - 40 mg/dL   LDL Chol Calc (NIH) 879 (H) 0 - 99 mg/dL   Chol/HDL Ratio 4.4 0.0 - 4.4 ratio    Comment:                                   T. Chol/HDL Ratio                                             Men  Women                               1/2 Avg.Risk  3.4    3.3                                   Avg.Risk  5.0    4.4                                2X Avg.Risk  9.6    7.1                                3X Avg.Risk 23.4   11.0   Hemoglobin A1c     Status: Abnormal   Collection Time: 06/01/24 11:48 AM  Result Value Ref Range   Hgb A1c MFr Bld 5.8 (H) 4.8 - 5.6 %    Comment:          Prediabetes: 5.7 - 6.4          Diabetes: >6.4          Glycemic control for adults with diabetes: <  7.0    Est. average glucose Bld gHb Est-mCnc 120 mg/dL  Vitamin B12     Status: None   Collection Time: 06/01/24 11:48 AM  Result Value Ref Range   Vitamin B-12 275 232 - 1,245 pg/mL  Vitamin D  (25 hydroxy)     Status: Abnormal   Collection Time: 06/01/24 11:48 AM  Result Value Ref Range   Vit D, 25-Hydroxy 24.4 (L) 30.0 - 100.0 ng/mL    Comment: Vitamin D  deficiency has been defined by the Institute of Medicine and an Endocrine Society practice guideline as a level of serum 25-OH vitamin D  less than 20 ng/mL (1,2). The Endocrine Society went on to further define vitamin D  insufficiency as a level between 21 and 29 ng/mL (2). 1. IOM (Institute of Medicine). 2010. Dietary reference    intakes for calcium and D. [place]: The    [employer]. 2. Holick MF, Binkley [place], Bischoff-Ferrari HA, et al.    Evaluation, treatment, and prevention of vitamin D     deficiency: an  Endocrine Society clinical practice    guideline. JCEM. 2011 Jul; 96(7):1911-30.   TSH     Status: Abnormal   Collection Time: 06/01/24 11:48 AM  Result Value Ref Range   TSH 0.430 (L) 0.450 - 4.500 uIU/mL  Iron, TIBC and Ferritin Panel     Status: Abnormal   Collection Time: 06/01/24 11:48 AM  Result Value Ref Range   Total Iron Binding Capacity 470 (H) 250 - 450 ug/dL   UIBC 551 (H) 868 - 574 ug/dL   Iron 22 (L) 27 - 159 ug/dL   Iron Saturation 5 (LL) 15 - 55 %   Ferritin 39 15 - 150 ng/mL       Assessment & Plan:   Assessment & Plan Acute pain of right knee     No follow-ups on file.   Total time spent: {AMA time spent:29001} minutes  ALAN CHRISTELLA ARRANT, FNP  08/01/2024   This document may have been prepared by [facility] Voice Recognition software and as such may include unintentional dictation errors.     [1]  Allergies Allergen Reactions   Benadryl [Diphenhydramine Hcl] Other (See Comments)    Leg jumpy   Cyclobenzaprine Other (See Comments)     legs jumpy  Restless legs   Ketorolac Hives   Motrin [Ibuprofen] Hives   Ketorolac Tromethamine Rash   Tramadol Rash and Hives   "

## 2024-08-02 ENCOUNTER — Other Ambulatory Visit: Payer: Self-pay | Admitting: Family

## 2024-08-02 ENCOUNTER — Ambulatory Visit: Admitting: Pulmonary Disease

## 2024-08-02 ENCOUNTER — Encounter: Payer: Self-pay | Admitting: Pulmonary Disease

## 2024-08-02 VITALS — BP 102/70 | HR 95 | Temp 97.8°F | Ht <= 58 in | Wt 138.0 lb

## 2024-08-02 DIAGNOSIS — Z87891 Personal history of nicotine dependence: Secondary | ICD-10-CM | POA: Diagnosis not present

## 2024-08-02 DIAGNOSIS — R0609 Other forms of dyspnea: Secondary | ICD-10-CM | POA: Diagnosis not present

## 2024-08-02 DIAGNOSIS — K219 Gastro-esophageal reflux disease without esophagitis: Secondary | ICD-10-CM | POA: Diagnosis not present

## 2024-08-02 DIAGNOSIS — R0602 Shortness of breath: Secondary | ICD-10-CM

## 2024-08-02 DIAGNOSIS — J189 Pneumonia, unspecified organism: Secondary | ICD-10-CM | POA: Diagnosis not present

## 2024-08-02 DIAGNOSIS — J4530 Mild persistent asthma, uncomplicated: Secondary | ICD-10-CM

## 2024-08-02 DIAGNOSIS — J4520 Mild intermittent asthma, uncomplicated: Secondary | ICD-10-CM | POA: Diagnosis not present

## 2024-08-02 DIAGNOSIS — J690 Pneumonitis due to inhalation of food and vomit: Secondary | ICD-10-CM

## 2024-08-02 LAB — NITRIC OXIDE: Nitric Oxide: 27

## 2024-08-02 MED ORDER — HYDROXYZINE HCL 50 MG PO TABS: 50.0000 mg | ORAL_TABLET | Freq: Three times a day (TID) | ORAL | 0 refills | Status: AC | PRN

## 2024-08-02 MED ORDER — ESOMEPRAZOLE MAGNESIUM 40 MG PO CPDR: 40.0000 mg | DELAYED_RELEASE_CAPSULE | Freq: Every day | ORAL | 2 refills | Status: AC

## 2024-08-02 NOTE — Progress Notes (Unsigned)
 "  Subjective:    Patient ID: Karen [name], female    DOB: Dec 08, 1980, 44 y.o.   MRN: [id number]  Patient Care Team: Orlean Alan HERO, FNP as PCP - General (Family Medicine) Fernand Fredy RAMAN, MD as Referring Physician (Internal Medicine) Dessa Reyes ORN, MD (General Surgery) Donalee Hemp, MD as Rounding Team (Internal Medicine)  Chief Complaint  Patient presents with   Consult    Asthma SOB    BACKGROUND:   HPI    Review of Systems A 10 point review of systems was performed and it is as noted above otherwise negative.   Past Medical History:  Diagnosis Date   Allergy    Anxiety    Asthma    GERD (gastroesophageal reflux disease)    Internal hemorrhoids    Pneumonia    Primary osteoarthritis of right hip     Past Surgical History:  Procedure Laterality Date   CARPAL TUNNEL RELEASE Bilateral 2010   CESAREAN SECTION  2009   COLONOSCOPY     EPIGASTRIC HERNIA REPAIR N/A 09/03/2015   Procedure: HERNIA REPAIR EPIGASTRIC ADULT;  Surgeon: Reyes ORN Dessa, MD;  Location: [facility] ORS;  Service: General;  Laterality: N/A;   HIP SURGERY  2013   [facility]ARIAN CYST [facility]GERY  2002   [facility] [place]   TONSILLECTOMY  2010   TOTAL HIP ARTHROPLASTY Right 11/26/2023   Procedure: ARTHROPLASTY, HIP, TOTAL, ANTERIOR APPROACH;  Surgeon: Lorelle Hussar, MD;  Location: [facility] ORS;  Service: Orthopedics;  Laterality: Right;    Patient Active Problem List   Diagnosis Date Noted   Grief reaction 06/09/2024   Gastrointestinal hemorrhage 05/26/2024   Intractable nausea and vomiting 05/25/2024   Pharmacologic therapy 05/17/2024   Disorder of skeletal system 05/17/2024   Problems influencing health status 05/17/2024   Drug tolerance, sequela 05/17/2024   Physical tolerance to opiate drug 05/17/2024   Constipation 12/31/2023   Nausea 12/31/2023   S/P total right hip arthroplasty 11/26/2023   PTSD (post-traumatic stress disorder) 12/20/2022   Routine history and physical  examination of adult 05/16/2021   Pain in right hip 12/18/2020   Generalized anxiety disorder 10/25/2020   Nonspecific abnormal results of function study of liver 08/17/2020   Opioid dependence, uncomplicated (HCC) 08/09/2020   Regional enteritis (HCC) 05/13/2020   Extrinsic asthma 12/16/2019   Mild intermittent asthma 06/27/2016   Community acquired pneumonia 01/01/2016   Epigastric hernia 11/22/2012   Chronic, continuous use of opioids 11/22/2012   At risk for abuse of opiates 11/22/2012   Lipoma of abdominal wall 11/22/2012   Umbilical hernia without obstruction or gangrene 11/22/2012   Pain in joint involving pelvic region and thigh 06/10/2012   Chronic pain syndrome 04/16/2012   Depressive disorder 04/16/2012   Anxiety state 04/16/2012   Tension type headache 04/16/2012   Major depressive disorder, single episode, severe (HCC) 04/16/2012    Family History  Problem Relation Age of Onset   COPD Mother    COPD Father    Breast cancer Maternal Aunt     Social History   Tobacco Use   Smoking status: Former    Current packs/day: 0.00    Average packs/day: 0.5 packs/day for 14.0 years (7.0 ttl pk-yrs)    Types: Cigarettes    Start date: 07/21/1996    Quit date: 07/21/2010    Years since quitting: 14.0   Smokeless tobacco: Never  Substance Use Topics   Alcohol use: No    Alcohol/week: 0.0 standard drinks of alcohol  Allergies[1]  Active Medications[2]  Immunization History  Administered Date(s) Administered   Influenza, Mdck, Trivalent,PF 6+ MOS(egg free) 04/25/2024   Pneumococcal Polysaccharide-23 01/02/2016        Objective:     Vitals:   08/02/24 1436  BP: 102/70  Pulse: 95  Temp: 97.8 F (36.6 C)  Height: 4' 6 (1.372 m)  Weight: 138 lb (62.6 kg)  SpO2: 96%  TempSrc: Oral  BMI (Calculated): 33.25     SpO2: 96 %  GENERAL: HEAD: Normocephalic, atraumatic.  EYES: Pupils equal, round, reactive to light.  No scleral icterus.  MOUTH:  NECK:  Supple. No thyromegaly. Trachea midline. No JVD.  No adenopathy. PULMONARY: Good air entry bilaterally.  No adventitious sounds. CARDIOVASCULAR: S1 and S2. Regular rate and rhythm.  ABDOMEN: MUSCULOSKELETAL: No joint deformity, no clubbing, no edema.  NEUROLOGIC:  SKIN: Intact,warm,dry. PSYCH:  Lab Results  Component Value Date   NITRICOXIDE 27 08/02/2024  This result suggests {FeNO results:33685}       Assessment & Plan:   No diagnosis found.  No orders of the defined types were placed in this encounter.   No orders of the defined types were placed in this encounter.    Advised if symptoms do not improve or worsen, to please contact office for sooner follow up or seek emergency care.    I spent xxx minutes of dedicated to the care of this patient on the date of this encounter to include pre-visit review of records, face-to-face time with the patient discussing conditions above, post visit ordering of testing, clinical documentation with the electronic health record, making appropriate referrals as documented, and communicating necessary findings to members of the patients care team.   C. Leita Sanders, MD Advanced [facility]ch[facility]    *This note was dictated using voice recognition software/Dragon.  Despite best efforts to proofread, errors can occur which can change the meaning. Any transcriptional errors that result from this process are unintentional and may not be fully corrected at the time of dictation.    [1]  Allergies Allergen Reactions   Benadryl [Diphenhydramine Hcl] Other (See Comments)    Leg jumpy   Cyclobenzaprine Other (See Comments)     legs jumpy  Restless legs   Ketorolac Hives   Motrin [Ibuprofen] Hives   Ketorolac Tromethamine Rash   Tramadol Rash and Hives  [2]  Current Meds  Medication Sig   acetaminophen  (TYLENOL ) 500 MG tablet Take 1,000 mg by mouth every 6 (six) hours as needed.   ALPRAZolam  (XANAX   XR) 1 MG 24 hr tablet Take 1 tablet (1 mg total) by mouth in the morning and at bedtime.   brexpiprazole  (REXULTI ) 1 MG TABS tablet Take 1 tablet (1 mg total) by mouth daily.   budeson-glycopyrrolate-formoterol  (BREZTRI AEROSPHERE) 160-9-4.8 MCG/ACT AERO inhaler Inhale 2 puffs into the lungs 2 (two) times daily as needed (respiratory issues.).   BUTRANS 15 MCG/HR 1 patch once a week.   medroxyPROGESTERone  Acetate 150 MG/ML SUSY Inject 1 mL into the muscle every 3 (three) months.   methocarbamol  (ROBAXIN ) 500 MG tablet Take 500 mg by mouth 3 (three) times daily as needed.   ondansetron  (ZOFRAN -ODT) 4 MG disintegrating tablet TAKE 1 TABLET BY MOUTH EVERY 8 HOURS AS NEEDED FOR NAUSEA AND VOMITING   TRINTELLIX  20 MG TABS tablet TAKE 1 TABLET BY MOUTH EVERY DAY   "

## 2024-08-02 NOTE — Patient Instructions (Signed)
 VISIT SUMMARY:  Karen [name], a 44 year old female, visited today due to shortness of breath and sleep disturbances. She has a history of recurrent pneumonia and asthma, and she experiences reflux symptoms. She recently had her heart checked and found to be enlarged, with an echocardiogram scheduled.  YOUR PLAN:  -CHRONIC DYSPNEA: Chronic dyspnea means experiencing long-term shortness of breath, especially during activities like climbing stairs or grocery shopping. We have scheduled breathing tests and an echocardiogram to evaluate your heart function. We also discussed the potential contribution of the Butrans patch to your shortness of breath.  -MILD INTERMITTENT ASTHMA: Asthma is a condition where your airways narrow and swell, causing breathing difficulties. Your asthma is well controlled with your current medication, Breztri. Please continue using Breztri as prescribed.  -RECURRENT PNEUMONIA LIKELY SECONDARY TO GASTROESOPHAGEAL REFLUX DISEASE: Recurrent pneumonia is likely due to gastroesophageal reflux disease (GERD), which causes stomach acid to flow back into your esophagus, leading to symptoms like waking up with a sour taste and occasional vomiting. We have started treatment for GERD to help manage these symptoms and prevent further pneumonia.  INSTRUCTIONS:  Please follow up with the scheduled breathing tests and echocardiogram. Continue using Breztri for asthma management and start the treatment for gastroesophageal reflux disease as discussed. If you have any new or worsening symptoms, please contact our office.

## 2024-08-05 ENCOUNTER — Other Ambulatory Visit

## 2024-08-11 ENCOUNTER — Telehealth: Payer: Self-pay | Admitting: Family

## 2024-08-11 NOTE — Telephone Encounter (Signed)
 Patient called to check on the status of Echocardiogram and to see if it has been approved by insurance. She also would like to know if Alan will call her in something for pneumonia. She has had it three times this year and says her symptoms are the same as before. Wheezing, cough more so at night, tiredness and not feeling well. She is having to use her inhaler more often when she goes outside or does a lot of walking especially stairs. Please let her know if she can send in a Z-Pak.

## 2024-08-11 NOTE — Progress Notes (Signed)
 "  Established Patient Office Visit  Subjective:  Patient ID: Karen [name], female    DOB: May 10, 1981  Age: 44 y.o. MRN: [id number]  Chief Complaint  Patient presents with   Follow-up    Patient is here today with several concerns after her most recent ED visit.  1) She was diagnosed with pneumonia, but refused to stay at the hospital, so she asks if we can send her some antibiotics.  2) She also had a scan while she was at the hospital which mentioned a possibility of heart failure, so she would like us  to get this checked as well.  3) At today's appointment, she fell from the table as the nurse was leaving the room, as she had an EKG. She was leaning to try to throw something away in the trash can on the bottom of the EKG cart, and she fell off the table and hit her head and face on the table and then her head on the floor. We did call EMS at that time, but she refused to go to the ED despite our urging and the EMS providers as well. She says that she will go later to be scanned.      No other concerns at this time.   Past Medical History:  Diagnosis Date   Allergy    Anxiety    Asthma    GERD (gastroesophageal reflux disease)    Internal hemorrhoids    Pneumonia    Primary osteoarthritis of right hip     Past Surgical History:  Procedure Laterality Date   CARPAL TUNNEL RELEASE Bilateral 2010   CESAREAN SECTION  2009   COLONOSCOPY     EPIGASTRIC HERNIA REPAIR N/A 09/03/2015   Procedure: HERNIA REPAIR EPIGASTRIC ADULT;  Surgeon: Reyes LELON Cota, MD;  Location: [facility] ORS;  Service: General;  Laterality: N/A;   HIP SURGERY  2013   OVARIAN CYST [facility]GERY  2002   [facility] [place]   TONSILLECTOMY  2010   TOTAL HIP ARTHROPLASTY Right 11/26/2023   Procedure: ARTHROPLASTY, HIP, TOTAL, ANTERIOR APPROACH;  Surgeon: Lorelle Hussar, MD;  Location: [facility] ORS;  Service: Orthopedics;  Laterality: Right;    Social History   Socioeconomic History   Marital status: Married     Spouse name: Borton,Steven (Significant other)   Number of children: 1   Years of education: Not on file   Highest education level: Not on file  Occupational History   Not on file  Tobacco Use   Smoking status: Former    Current packs/day: 0.00    Average packs/day: 0.5 packs/day for 14.0 years (7.0 ttl pk-yrs)    Types: Cigarettes    Start date: 07/21/1996    Quit date: 07/21/2010    Years since quitting: 14.0   Smokeless tobacco: Never  Vaping Use   Vaping status: Never Used  Substance and Sexual Activity   Alcohol use: No    Alcohol/week: 0.0 standard drinks of alcohol   Drug use: Not Currently    Comment: is on buprenorphine   Sexual activity: Yes  Other Topics Concern   Not on file  Social History Narrative   Not on file   Social Drivers of Health   Tobacco Use: Medium Risk (08/02/2024)   Patient History    Smoking Tobacco Use: Former    Smokeless Tobacco Use: Never    Passive Exposure: Not on file  Financial Resource Strain: Low Risk  (07/12/2024)   [facility]  Overall Financial Resource Strain (CARDIA)    Difficulty of Paying Living Expenses: Not very hard  Food Insecurity: No Food Insecurity (07/12/2024)   Received from [facility]   Epic    Within the past 12 months, you worried that your food would run out before you got the money to buy more.: Never true    Within the past 12 months, the food you bought just didn't last and you didn't have money to get more.: Never true  Transportation Needs: No Transportation Needs (07/12/2024)   Received from [facility] - Transportation    In the past 12 months, has lack of transportation kept you from medical appointments or from getting medications?: No    Lack of Transportation (Non-Medical): No  Physical Activity: Not on file  Stress: Not on file  Social Connections: Not on file  Intimate Partner Violence: Patient Declined (05/25/2024)    Epic    Fear of Current or Ex-Partner: Patient declined    Emotionally Abused: Patient declined    Physically Abused: Patient declined    Sexually Abused: Patient declined  Depression (PHQ2-9): Low Risk (09/17/2023)   Depression (PHQ2-9)    PHQ-2 Score: 2  Alcohol Screen: Not on file  Housing: Low Risk  (07/12/2024)   Received from [facility]   Epic    In the last 12 months, was there a time when you were not able to pay the mortgage or rent on time?: No    In the past 12 months, how many times have you moved where you were living?: 0    At any time in the past 12 months, were you homeless or living in a shelter (including now)?: No  Utilities: Not At Risk (07/12/2024)   Received from [facility]   Epic    In the past 12 months has the electric, gas, oil, or water company threatened to shut off services in your home?: No  Health Literacy: Not on file    Family History  Problem Relation Age of Onset   COPD Mother    COPD Father    Breast cancer Maternal Aunt     Allergies[1]  Review of Systems  Respiratory:  Positive for shortness of breath.   Cardiovascular:  Positive for chest pain and PND.  All other systems reviewed and are negative.      Objective:   BP 112/64   Pulse 92   Ht 4' 8 (1.422 m)   Wt 140 lb (63.5 kg)   LMP 09/27/2016   SpO2 98%   BMI 31.39 kg/m   Vitals:   07/01/24 1305  BP: 112/64  Pulse: 92  Height: 4' 8 (1.422 m)  Weight: 140 lb (63.5 kg)  SpO2: 98%  BMI (Calculated): 31.41    Physical Exam Vitals and nursing note reviewed.  Constitutional:      Appearance: Normal appearance. She is normal weight.  HENT:     Head: Normocephalic.  Eyes:     Extraocular Movements: Extraocular movements intact.     Conjunctiva/sclera: Conjunctivae normal.     Pupils: Pupils are equal, round, and reactive to light.  Cardiovascular:     Rate and Rhythm: Normal rate.  Pulmonary:     Effort: Pulmonary effort is  normal.  Neurological:     General: No focal deficit present.     Mental Status: She is alert and oriented to person, place, and time. Mental status is at  baseline.  Psychiatric:        Mood and Affect: Mood normal.        Behavior: Behavior normal.        Thought Content: Thought content normal.      No results found for any visits on 07/01/24.  Recent Results (from the past 2160 hours)  Urinalysis, w/ Reflex to Culture (Infection Suspected) -Urine, Clean Catch     Status: Abnormal   Collection Time: 05/13/24  6:36 PM  Result Value Ref Range   Specimen Source URINE, CLEAN CATCH    Color, Urine AMBER (A) YELLOW    Comment: BIOCHEMICALS MAY BE AFFECTED BY COLOR   APPearance HAZY (A) CLEAR   Specific Gravity, Urine 1.025 1.005 - 1.030   pH 5.5 5.0 - 8.0   Glucose, UA NEGATIVE NEGATIVE mg/dL   Hgb urine dipstick MODERATE (A) NEGATIVE   Bilirubin Urine NEGATIVE NEGATIVE   Ketones, ur NEGATIVE NEGATIVE mg/dL   Protein, ur NEGATIVE NEGATIVE mg/dL   Nitrite NEGATIVE NEGATIVE   Leukocytes,Ua NEGATIVE NEGATIVE   Squamous Epithelial / HPF 11-20 0 - 5 /HPF   WBC, UA 0-5 0 - 5 WBC/hpf    Comment: Reflex urine culture not performed if WBC <=10, OR if Squamous epithelial cells >5. If Squamous epithelial cells >5, suggest recollection.   RBC / HPF 11-20 0 - 5 RBC/hpf   Bacteria, UA MANY (A) NONE SEEN   Mucus PRESENT     Comment: Performed at [facility], [address], [place], [place] [address]  Urine Culture     Status: None   Collection Time: 05/13/24  6:36 PM   Specimen: Urine, Clean Catch  Result Value Ref Range   Specimen Description      URINE, CLEAN CATCH Performed at [facility], [address] [place], [place] [address]    Special Requests      NONE Performed at [facility], [address] [place], [place] [address]    Culture      NO GROWTH Performed at [facility], [address], [place], [place] [address]     Report Status 05/14/2024 FINAL   Lipase, blood     Status: None   Collection Time: 05/25/24  9:28 AM  Result Value Ref Range   Lipase 27 11 - 51 U/L    Comment: Performed at [facility], [address] [place], [place] [address]  Comprehensive metabolic panel     Status: Abnormal   Collection Time: 05/25/24  9:28 AM  Result Value Ref Range   Sodium 141 135 - 145 mmol/L   Potassium 3.9 3.5 - 5.1 mmol/L   Chloride 107 98 - 111 mmol/L   CO2 21 (L) 22 - 32 mmol/L   Glucose, Bld 142 (H) 70 - 99 mg/dL    Comment: Glucose reference range applies only to samples taken after fasting for at least 8 hours.   BUN 15 6 - 20 mg/dL   Creatinine, Ser 9.18 0.44 - 1.00 mg/dL   Calcium 8.4 (L) 8.9 - 10.3 mg/dL   Total Protein 7.0 6.5 - 8.1 g/dL   Albumin 3.6 3.5 - 5.0 g/dL   AST 20 15 - 41 U/L   ALT 13 0 - 44 U/L   Alkaline Phosphatase 72 38 - 126 U/L   Total Bilirubin 0.4 0.0 - 1.2 mg/dL   GFR, Estimated >39 >39 mL/min    Comment: (NOTE) Calculated using the CKD-EPI Creatinine  Equation (2021)    Anion gap 13 5 - 15    Comment: Performed at [facility], [address] [place], [place] [address]  CBC     Status: Abnormal   Collection Time: 05/25/24  9:28 AM  Result Value Ref Range   WBC 7.1 4.0 - 10.5 K/uL   RBC 3.99 3.87 - 5.11 MIL/uL   Hemoglobin 8.8 (L) 12.0 - 15.0 g/dL    Comment: Reticulocyte Hemoglobin testing may be clinically indicated, consider ordering this additional test [id number]    HCT 29.9 (L) 36.0 - 46.0 %   MCV 74.9 (L) 80.0 - 100.0 fL   MCH 22.1 (L) 26.0 - 34.0 pg   MCHC 29.4 (L) 30.0 - 36.0 g/dL   RDW 83.1 (H) 88.4 - 84.4 %   Platelets 287 150 - 400 K/uL   nRBC 0.0 0.0 - 0.2 %    Comment: Performed at [facility], [address] [place], [place] [address]  Ethanol     Status: None   Collection Time: 05/25/24  9:28 AM  Result Value Ref Range   Alcohol, Ethyl (B) <15 <15 mg/dL    Comment: (NOTE) For medical purposes  only. Performed at [facility] [address] [place], [place] [address]   Acetaminophen  level     Status: Abnormal   Collection Time: 05/25/24  9:28 AM  Result Value Ref Range   Acetaminophen  (Tylenol ), Serum <10 (L) 10 - 30 ug/mL    Comment: (NOTE) Therapeutic concentrations vary significantly. A range of 10-30 ug/mL  may be an effective concentration for many patients. However, some  are best treated at concentrations outside of this range. Acetaminophen  concentrations >150 ug/mL at 4 hours after ingestion  and >50 ug/mL at 12 hours after ingestion are often associated with  toxic reactions.  Performed at [facility], [address], [place], [place] [address]   Salicylate level     Status: Abnormal   Collection Time: 05/25/24  9:28 AM  Result Value Ref Range   Salicylate Lvl <7.0 (L) 7.0 - 30.0 mg/dL    Comment: Performed at [facility], [address] [place], [place] [address]  Brain natriuretic peptide     Status: None   Collection Time: 05/25/24  9:28 AM  Result Value Ref Range   B Natriuretic Peptide 63.9 0.0 - 100.0 pg/mL    Comment: Performed at [facility], [address] [place], [place] [address]  Troponin I (High Sensitivity)     Status: None   Collection Time: 05/25/24  9:28 AM  Result Value Ref Range   Troponin I (High Sensitivity) 4 <18 ng/L    Comment: (NOTE) Elevated high sensitivity troponin I (hsTnI) values and significant  changes across serial measurements may suggest ACS but many other  chronic and acute conditions are known to elevate hsTnI results.  Refer to the Links section for chest pain algorithms and additional  guidance. Performed at [facility], [address] [place], [place] [address]   Procalcitonin     Status: None   Collection Time: 05/25/24  9:28 AM  Result Value Ref Range   Procalcitonin <0.10 ng/mL    Comment:        Interpretation: PCT (Procalcitonin) <= 0.5  ng/mL: Systemic infection (sepsis) is not likely. Local bacterial infection is possible. (NOTE)       Sepsis PCT Algorithm           Lower Respiratory Tract  Infection PCT Algorithm    ----------------------------     ----------------------------         PCT < 0.25 ng/mL                PCT < 0.10 ng/mL          Strongly encourage             Strongly discourage   discontinuation of antibiotics    initiation of antibiotics    ----------------------------     -----------------------------       PCT 0.25 - 0.50 ng/mL            PCT 0.10 - 0.25 ng/mL               OR       >80% decrease in PCT            Discourage initiation of                                            antibiotics      Encourage discontinuation           of antibiotics    ----------------------------     -----------------------------         PCT >= 0.50 ng/mL              PCT 0.26 - 0.50 ng/mL               AND        <80% decrease in PCT             Encourage initiation of                                             antibiotics       Encourage continuation           of antibiotics    ----------------------------     -----------------------------        PCT >= 0.50 ng/mL                  PCT > 0.50 ng/mL               AND         increase in PCT                  Strongly encourage                                      initiation of antibiotics    Strongly encourage escalation           of antibiotics                                     -----------------------------                                           PCT <= 0.25 ng/mL  OR                                        > 80% decrease in PCT                                      Discontinue / Do not initiate                                             antibiotics  Performed at [facility], [address] [place], [place] [address]   Magnesium      Status: None    Collection Time: 05/25/24  9:28 AM  Result Value Ref Range   Magnesium  1.7 1.7 - 2.4 mg/dL    Comment: Performed at [facility], [address] [place], [place] [address]  Urinalysis, Routine w reflex microscopic -Urine, Clean Catch     Status: Abnormal   Collection Time: 05/25/24 11:33 AM  Result Value Ref Range   Color, Urine STRAW (A) YELLOW   APPearance CLEAR (A) CLEAR   Specific Gravity, Urine 1.012 1.005 - 1.030   pH 5.0 5.0 - 8.0   Glucose, UA NEGATIVE NEGATIVE mg/dL   Hgb urine dipstick NEGATIVE NEGATIVE   Bilirubin Urine NEGATIVE NEGATIVE   Ketones, ur NEGATIVE NEGATIVE mg/dL   Protein, ur NEGATIVE NEGATIVE mg/dL   Nitrite NEGATIVE NEGATIVE   Leukocytes,Ua NEGATIVE NEGATIVE    Comment: Performed at [facility], [address], [place], [place] [address]  Urine Drug Screen, Qualitative ([facility] only)     Status: Abnormal   Collection Time: 05/25/24 11:33 AM  Result Value Ref Range   Tricyclic, Ur Screen NONE DETECTED NONE DETECTED   Amphetamines, Ur Screen NONE DETECTED NONE DETECTED   MDMA (Ecstasy)Ur Screen NONE DETECTED NONE DETECTED   Cocaine Metabolite,Ur [place] NONE DETECTED NONE DETECTED   Opiate, Ur Screen NONE DETECTED NONE DETECTED   Phencyclidine (PCP) Ur S NONE DETECTED NONE DETECTED   Cannabinoid 50 Ng, Ur [place] POSITIVE (A) NONE DETECTED   Barbiturates, Ur Screen NONE DETECTED NONE DETECTED   Benzodiazepine, Ur Scrn POSITIVE (A) NONE DETECTED   Methadone Scn, Ur NONE DETECTED NONE DETECTED    Comment: (NOTE) Tricyclics + metabolites, urine    Cutoff 1000 ng/mL Amphetamines + metabolites, urine  Cutoff 1000 ng/mL MDMA (Ecstasy), urine              Cutoff 500 ng/mL Cocaine Metabolite, urine          Cutoff 300 ng/mL Opiate + metabolites, urine        Cutoff 300 ng/mL Phencyclidine (PCP), urine         Cutoff 25 ng/mL Cannabinoid, urine                 Cutoff 50 ng/mL Barbiturates + metabolites, urine  Cutoff 200 ng/mL Benzodiazepine, urine               Cutoff 200 ng/mL Methadone, urine                   Cutoff 300 ng/mL  The urine drug screen provides only a preliminary, unconfirmed analytical test result and should not be used  for non-medical purposes. Clinical consideration and professional judgment should be applied to any positive drug screen result due to possible interfering substances. A more specific alternate chemical method must be used in order to obtain a confirmed analytical result. Gas chromatography / mass spectrometry (GC/MS) is the preferred confirm atory method. Performed at [facility], [address] [place], [place] [address]   Resp panel by RT-PCR (RSV, Flu A&B, Covid) Anterior Nasal Swab     Status: None   Collection Time: 05/25/24  2:26 PM   Specimen: Anterior Nasal Swab  Result Value Ref Range   SARS Coronavirus 2 by RT PCR NEGATIVE NEGATIVE    Comment: (NOTE) SARS-CoV-2 target nucleic acids are NOT DETECTED.  The SARS-CoV-2 RNA is generally detectable in upper respiratory specimens during the acute phase of infection. The lowest concentration of SARS-CoV-2 viral copies this assay can detect is 138 copies/mL. A negative result does not preclude SARS-Cov-2 infection and should not be used as the sole basis for treatment or other patient management decisions. A negative result may occur with  improper specimen collection/handling, submission of specimen other than nasopharyngeal swab, presence of viral mutation(s) within the areas targeted by this assay, and inadequate number of viral copies(<138 copies/mL). A negative result must be combined with clinical observations, patient history, and epidemiological information. The expected result is Negative.  Fact Sheet for Patients:  bloggercourse.com  Fact Sheet for Healthcare Providers:  seriousbroker.it  This test is no t yet approved or cleared by the [place]  FDA and  has been  authorized for detection and/or diagnosis of SARS-CoV-2 by FDA under an Emergency Use Authorization (EUA). This EUA will remain  in effect (meaning this test can be used) for the duration of the COVID-19 declaration under Section 564(b)(1) of the Act, 21 U.S.C.section 360bbb-3(b)(1), unless the authorization is terminated  or revoked sooner.       Influenza A by PCR NEGATIVE NEGATIVE   Influenza B by PCR NEGATIVE NEGATIVE    Comment: (NOTE) The Xpert Xpress SARS-CoV-2/FLU/RSV plus assay is intended as an aid in the diagnosis of influenza from Nasopharyngeal swab specimens and should not be used as a sole basis for treatment. Nasal washings and aspirates are unacceptable for Xpert Xpress SARS-CoV-2/FLU/RSV testing.  Fact Sheet for Patients: bloggercourse.com  Fact Sheet for Healthcare Providers: seriousbroker.it  This test is not yet approved or cleared by the [place]  FDA and has been authorized for detection and/or diagnosis of SARS-CoV-2 by FDA under an Emergency Use Authorization (EUA). This EUA will remain in effect (meaning this test can be used) for the duration of the COVID-19 declaration under Section 564(b)(1) of the Act, 21 U.S.C. section 360bbb-3(b)(1), unless the authorization is terminated or revoked.     Resp Syncytial Virus by PCR NEGATIVE NEGATIVE    Comment: (NOTE) Fact Sheet for Patients: bloggercourse.com  Fact Sheet for Healthcare Providers: seriousbroker.it  This test is not yet approved or cleared by the [place]  FDA and has been authorized for detection and/or diagnosis of SARS-CoV-2 by FDA under an Emergency Use Authorization (EUA). This EUA will remain in effect (meaning this test can be used) for the duration of the COVID-19 declaration under Section 564(b)(1) of the Act, 21 U.S.C. section 360bbb-3(b)(1), unless the authorization is  terminated or revoked.  Performed at [facility], [address] [place], [place] [address]   Lactic acid, plasma     Status: Abnormal   Collection Time: 05/25/24  3:52 PM  Result Value  Ref Range   Lactic Acid, Venous 2.3 (HH) 0.5 - 1.9 mmol/L    Comment: CRITICAL RESULT CALLED TO, READ BACK BY AND VERIFIED WITH ASHLEY JACKSON 05/25/24 1639 MU Performed at [facility], [address] [place], [place] [address]   Blood Culture (routine x 2)     Status: None   Collection Time: 05/25/24  3:52 PM   Specimen: BLOOD RIGHT ARM  Result Value Ref Range   Specimen Description BLOOD RIGHT ARM    Special Requests      BOTTLES DRAWN AEROBIC AND ANAEROBIC Blood Culture results may not be optimal due to an inadequate volume of blood received in culture bottles   Culture      NO GROWTH 5 DAYS Performed at [facility], [address] [place], [place] [address]    Report Status 05/30/2024 FINAL   Blood Culture (routine x 2)     Status: None   Collection Time: 05/25/24  3:52 PM   Specimen: BLOOD LEFT ARM  Result Value Ref Range   Specimen Description BLOOD LEFT ARM    Special Requests      BOTTLES DRAWN AEROBIC AND ANAEROBIC Blood Culture adequate volume   Culture      NO GROWTH 5 DAYS Performed at [facility], [address] [place], [place] [address]    Report Status 05/30/2024 FINAL   Lactic acid, plasma     Status: Abnormal   Collection Time: 05/25/24  6:45 PM  Result Value Ref Range   Lactic Acid, Venous 2.2 (HH) 0.5 - 1.9 mmol/L    Comment: CRITICAL VALUE NOTED. VALUE IS CONSISTENT WITH PREVIOUSLY REPORTED/CALLED VALUE SKL Performed at [facility], [address] [place], [place] [address]   HIV Antibody (routine testing w rflx)     Status: None   Collection Time: 05/25/24  6:45 PM  Result Value Ref Range   HIV Screen 4th Generation wRfx Non Reactive Non Reactive    Comment: Performed at [facility], [address], [place], [place] [address]  Blood gas, venous     Status: None   Collection Time: 05/25/24  6:45 PM  Result Value Ref Range   pH, Ven 7.34 7.25 - 7.43   pCO2, Ven 48 44 - 60 mmHg   pO2, Ven 36 32 - 45 mmHg   Bicarbonate 25.9 20.0 - 28.0 mmol/L   Acid-base deficit 0.4 0.0 - 2.0 mmol/L   O2 Saturation 56.9 %   Patient temperature 37.0    Collection site VEIN     Comment: Performed at [facility], [address], [place], [place] [address]  Basic metabolic panel     Status: Abnormal   Collection Time: 05/26/24  5:42 AM  Result Value Ref Range   Sodium 139 135 - 145 mmol/L   Potassium 3.5 3.5 - 5.1 mmol/L   Chloride 108 98 - 111 mmol/L   CO2 25 22 - 32 mmol/L   Glucose, Bld 103 (H) 70 - 99 mg/dL    Comment: Glucose reference range applies only to samples taken after fasting for at least 8 hours.   BUN 10 6 - 20 mg/dL   Creatinine, Ser 9.41 0.44 - 1.00 mg/dL   Calcium 7.6 (L) 8.9 - 10.3 mg/dL   GFR, Estimated >39 >39 mL/min    Comment: (NOTE) Calculated using the CKD-EPI Creatinine Equation (2021)    Anion gap 6 5 - 15    Comment: Performed at [facility], [address] [place], [place] [address]  CBC     Status: Abnormal   Collection Time: 05/26/24  5:42 AM  Result Value Ref Range   WBC 10.2 4.0 - 10.5 K/uL   RBC 3.17 (L) 3.87 - 5.11 MIL/uL   Hemoglobin 6.8 (L) 12.0 - 15.0 g/dL    Comment: Reticulocyte Hemoglobin testing may be clinically indicated, consider ordering this additional test [id number]    HCT 23.7 (L) 36.0 - 46.0 %   MCV 74.8 (L) 80.0 - 100.0 fL   MCH 21.5 (L) 26.0 - 34.0 pg   MCHC 28.7 (L) 30.0 - 36.0 g/dL   RDW 83.1 (H) 88.4 - 84.4 %   Platelets 225 150 - 400 K/uL   nRBC 0.0 0.0 - 0.2 %    Comment: Performed at [facility], [address] [place], [place] [address]  Glucose, capillary     Status: None   Collection Time: 05/26/24  7:54 AM  Result Value Ref Range   Glucose-Capillary 75 70 - 99 mg/dL    Comment:  Glucose reference range applies only to samples taken after fasting for at least 8 hours.  CBC     Status: Abnormal   Collection Time: 05/26/24  9:26 AM  Result Value Ref Range   WBC 10.9 (H) 4.0 - 10.5 K/uL   RBC 3.29 (L) 3.87 - 5.11 MIL/uL   Hemoglobin 7.2 (L) 12.0 - 15.0 g/dL    Comment: Reticulocyte Hemoglobin testing may be clinically indicated, consider ordering this additional test [id number]    HCT 24.7 (L) 36.0 - 46.0 %   MCV 75.1 (L) 80.0 - 100.0 fL   MCH 21.9 (L) 26.0 - 34.0 pg   MCHC 29.1 (L) 30.0 - 36.0 g/dL   RDW 82.9 (H) 88.4 - 84.4 %   Platelets 227 150 - 400 K/uL   nRBC 0.0 0.0 - 0.2 %    Comment: Performed at [facility], [address] [place], [place] [address]  Hemoglobin A1c     Status: Abnormal   Collection Time: 05/26/24  9:26 AM  Result Value Ref Range   Hgb A1c MFr Bld 5.8 (H) 4.8 - 5.6 %    Comment: (NOTE) Diagnosis of Diabetes The following HbA1c ranges recommended by the American Diabetes Association (ADA) may be used as an aid in the diagnosis of diabetes mellitus.  Hemoglobin             Suggested A1C NGSP%              Diagnosis  <5.7                   Non Diabetic  5.7-6.4                Pre-Diabetic  >6.4                   Diabetic  <7.0                   Glycemic control for                       adults with diabetes.     Mean Plasma Glucose 119.76 mg/dL    Comment: Performed at [facility], [address], [place], [place] [address]  CBC with Diff     Status: Abnormal   Collection Time: 06/01/24 11:48 AM  Result Value Ref Range   WBC 7.2  3.4 - 10.8 x10E3/uL   RBC 4.42 3.77 - 5.28 x10E6/uL   Hemoglobin 9.6 (L) 11.1 - 15.9 g/dL   Hematocrit 66.7 (L) 65.9 - 46.6 %   MCV 75 (L) 79 - 97 fL   MCH 21.7 (L) 26.6 - 33.0 pg   MCHC 28.9 (L) 31.5 - 35.7 g/dL   RDW 83.5 (H) 88.2 - 84.5 %   Platelets 427 150 - 450 x10E3/uL   Neutrophils 78 Not Estab. %   Lymphs 11 Not Estab. %   Monocytes 5 Not Estab. %   Eos 3 Not  Estab. %   Basos 1 Not Estab. %   Neutrophils Absolute 5.8 1.4 - 7.0 x10E3/uL   Lymphocytes Absolute 0.8 0.7 - 3.1 x10E3/uL   Monocytes Absolute 0.3 0.1 - 0.9 x10E3/uL   EOS (ABSOLUTE) 0.2 0.0 - 0.4 x10E3/uL   Basophils Absolute 0.1 0.0 - 0.2 x10E3/uL   Immature Granulocytes 2 Not Estab. %   Immature Grans (Abs) 0.1 0.0 - 0.1 x10E3/uL  Comprehensive metabolic panel with GFR     Status: Abnormal   Collection Time: 06/01/24 11:48 AM  Result Value Ref Range   Glucose 119 (H) 70 - 99 mg/dL   BUN 10 6 - 24 mg/dL   Creatinine, Ser 9.14 0.57 - 1.00 mg/dL   eGFR 87 >40 fO/fpw/8.26   BUN/Creatinine Ratio 12 9 - 23   Sodium 142 134 - 144 mmol/L   Potassium 4.1 3.5 - 5.2 mmol/L   Chloride 104 96 - 106 mmol/L   CO2 21 20 - 29 mmol/L   Calcium 8.8 8.7 - 10.2 mg/dL   Total Protein 7.2 6.0 - 8.5 g/dL   Albumin 4.4 3.9 - 4.9 g/dL   Globulin, Total 2.8 1.5 - 4.5 g/dL   Bilirubin Total 0.2 0.0 - 1.2 mg/dL   Alkaline Phosphatase 83 41 - 116 IU/L   AST 13 0 - 40 IU/L   ALT 9 0 - 32 IU/L  Lipid Profile     Status: Abnormal   Collection Time: 06/01/24 11:48 AM  Result Value Ref Range   Cholesterol, Total 181 100 - 199 mg/dL   Triglycerides 891 0 - 149 mg/dL   HDL 41 >60 mg/dL   VLDL Cholesterol Cal 20 5 - 40 mg/dL   LDL Chol Calc (NIH) 879 (H) 0 - 99 mg/dL   Chol/HDL Ratio 4.4 0.0 - 4.4 ratio    Comment:                                   T. Chol/HDL Ratio                                             Men  Women                               1/2 Avg.Risk  3.4    3.3                                   Avg.Risk  5.0    4.4  2X Avg.Risk  9.6    7.1                                3X Avg.Risk 23.4   11.0   Hemoglobin A1c     Status: Abnormal   Collection Time: 06/01/24 11:48 AM  Result Value Ref Range   Hgb A1c MFr Bld 5.8 (H) 4.8 - 5.6 %    Comment:          Prediabetes: 5.7 - 6.4          Diabetes: >6.4          Glycemic control for adults with diabetes: <7.0     Est. average glucose Bld gHb Est-mCnc 120 mg/dL  Vitamin B12     Status: None   Collection Time: 06/01/24 11:48 AM  Result Value Ref Range   Vitamin B-12 275 232 - 1,245 pg/mL  Vitamin D  (25 hydroxy)     Status: Abnormal   Collection Time: 06/01/24 11:48 AM  Result Value Ref Range   Vit D, 25-Hydroxy 24.4 (L) 30.0 - 100.0 ng/mL    Comment: Vitamin D  deficiency has been defined by the Institute of Medicine and an Endocrine Society practice guideline as a level of serum 25-OH vitamin D  less than 20 ng/mL (1,2). The Endocrine Society went on to further define vitamin D  insufficiency as a level between 21 and 29 ng/mL (2). 1. IOM (Institute of Medicine). 2010. Dietary reference    intakes for calcium and D. [place]: The    [employer]. 2. Holick MF, Binkley Joiner, Bischoff-Ferrari HA, et al.    Evaluation, treatment, and prevention of vitamin D     deficiency: an Endocrine Society clinical practice    guideline. JCEM. 2011 Jul; 96(7):1911-30.   TSH     Status: Abnormal   Collection Time: 06/01/24 11:48 AM  Result Value Ref Range   TSH 0.430 (L) 0.450 - 4.500 uIU/mL  Iron, TIBC and Ferritin Panel     Status: Abnormal   Collection Time: 06/01/24 11:48 AM  Result Value Ref Range   Total Iron Binding Capacity 470 (H) 250 - 450 ug/dL   UIBC 551 (H) 868 - 574 ug/dL   Iron 22 (L) 27 - 159 ug/dL   Iron Saturation 5 (LL) 15 - 55 %   Ferritin 39 15 - 150 ng/mL  Nitric oxide      Status: None   Collection Time: 08/02/24  3:07 PM  Result Value Ref Range   Nitric Oxide  27        Assessment & Plan Drug induced constipation Linzess  samples given in office.  Will let me know if this works.   Congestive heart failure, unspecified HF chronicity, unspecified heart failure type (HCC) EKG in office today normal.  Setting up for an echocardiogram.   Anxiety state Depressive disorder At risk for abuse of opiates Grief reaction Sending referral to psych.  Increase rexulti  to  1mg .  Changing alprazolam  to XR.    Return in about 1 month (around 08/01/2024).   Total time spent: 45 minutes  ALAN CHRISTELLA ARRANT, FNP  07/01/2024   This document may have been prepared by [facility] Voice Recognition software and as such may include unintentional dictation errors.      [1]  Allergies Allergen Reactions   Benadryl [Diphenhydramine Hcl] Other (See Comments)    Leg jumpy   Cyclobenzaprine Other (See Comments)  legs jumpy  Restless legs   Ketorolac Hives   Motrin [Ibuprofen] Hives   Ketorolac Tromethamine Rash   Tramadol Rash and Hives   "

## 2024-08-12 ENCOUNTER — Ambulatory Visit: Payer: Self-pay

## 2024-08-12 ENCOUNTER — Other Ambulatory Visit: Payer: Self-pay

## 2024-08-12 MED ORDER — AZITHROMYCIN 250 MG PO TABS: ORAL_TABLET | ORAL | 0 refills | Status: AC

## 2024-08-17 NOTE — Assessment & Plan Note (Signed)
 Sending referral to psych.  Increase rexulti  to 1mg .  Changing alprazolam  to XR.

## 2024-08-19 ENCOUNTER — Other Ambulatory Visit: Payer: Self-pay | Admitting: Family

## 2024-08-19 MED ORDER — ALPRAZOLAM ER 1 MG PO TB24: 1.0000 mg | ORAL_TABLET | Freq: Two times a day (BID) | ORAL | 0 refills | Status: AC

## 2024-08-24 ENCOUNTER — Other Ambulatory Visit: Payer: Self-pay | Admitting: Family

## 2024-08-25 ENCOUNTER — Emergency Department
Admission: EM | Admit: 2024-08-25 | Discharge: 2024-08-26 | Disposition: A | Source: Home / Self Care | Attending: Emergency Medicine | Admitting: Emergency Medicine

## 2024-08-25 ENCOUNTER — Emergency Department

## 2024-08-25 ENCOUNTER — Other Ambulatory Visit: Payer: Self-pay

## 2024-08-25 DIAGNOSIS — F1390 Sedative, hypnotic, or anxiolytic use, unspecified, uncomplicated: Secondary | ICD-10-CM | POA: Insufficient documentation

## 2024-08-25 DIAGNOSIS — T424X4A Poisoning by benzodiazepines, undetermined, initial encounter: Secondary | ICD-10-CM

## 2024-08-25 LAB — CBC WITH DIFFERENTIAL/PLATELET
Abs Immature Granulocytes: 0.06 10*3/uL (ref 0.00–0.07)
Basophils Absolute: 0 10*3/uL (ref 0.0–0.1)
Basophils Relative: 1 %
Eosinophils Absolute: 0.1 10*3/uL (ref 0.0–0.5)
Eosinophils Relative: 1 %
HCT: 29.7 % — ABNORMAL LOW (ref 36.0–46.0)
Hemoglobin: 8.9 g/dL — ABNORMAL LOW (ref 12.0–15.0)
Immature Granulocytes: 1 %
Lymphocytes Relative: 10 %
Lymphs Abs: 0.8 10*3/uL (ref 0.7–4.0)
MCH: 21.3 pg — ABNORMAL LOW (ref 26.0–34.0)
MCHC: 30 g/dL (ref 30.0–36.0)
MCV: 71.1 fL — ABNORMAL LOW (ref 80.0–100.0)
Monocytes Absolute: 0.6 10*3/uL (ref 0.1–1.0)
Monocytes Relative: 8 %
Neutro Abs: 6.4 10*3/uL (ref 1.7–7.7)
Neutrophils Relative %: 79 %
Platelets: 237 10*3/uL (ref 150–400)
RBC: 4.18 MIL/uL (ref 3.87–5.11)
RDW: 16.7 % — ABNORMAL HIGH (ref 11.5–15.5)
WBC: 8.1 10*3/uL (ref 4.0–10.5)
nRBC: 0 % (ref 0.0–0.2)

## 2024-08-25 LAB — COMPREHENSIVE METABOLIC PANEL WITH GFR
ALT: 14 U/L (ref 0–44)
AST: 24 U/L (ref 15–41)
Albumin: 4.1 g/dL (ref 3.5–5.0)
Alkaline Phosphatase: 101 U/L (ref 38–126)
Anion gap: 14 (ref 5–15)
BUN: 10 mg/dL (ref 6–20)
CO2: 20 mmol/L — ABNORMAL LOW (ref 22–32)
Calcium: 8.3 mg/dL — ABNORMAL LOW (ref 8.9–10.3)
Chloride: 109 mmol/L (ref 98–111)
Creatinine, Ser: 0.77 mg/dL (ref 0.44–1.00)
GFR, Estimated: >60 mL/min
Glucose, Bld: 125 mg/dL — ABNORMAL HIGH (ref 70–99)
Potassium: 3.7 mmol/L (ref 3.5–5.1)
Sodium: 143 mmol/L (ref 135–145)
Total Bilirubin: <0.2 mg/dL (ref 0.0–1.2)
Total Protein: 6.8 g/dL (ref 6.5–8.1)

## 2024-08-25 LAB — URINE DRUG SCREEN
Amphetamines: NEGATIVE
Barbiturates: NEGATIVE
Benzodiazepines: POSITIVE — AB
Cocaine: NEGATIVE
Fentanyl: NEGATIVE
Methadone Scn, Ur: NEGATIVE
Opiates: POSITIVE — AB
Tetrahydrocannabinol: POSITIVE — AB

## 2024-08-25 LAB — URINALYSIS, ROUTINE W REFLEX MICROSCOPIC
Bilirubin Urine: NEGATIVE
Glucose, UA: NEGATIVE mg/dL
Hgb urine dipstick: NEGATIVE
Ketones, ur: NEGATIVE mg/dL
Leukocytes,Ua: NEGATIVE
Nitrite: NEGATIVE
Protein, ur: NEGATIVE mg/dL
Specific Gravity, Urine: 1.008 (ref 1.005–1.030)
pH: 8 (ref 5.0–8.0)

## 2024-08-25 LAB — ACETAMINOPHEN LEVEL: Acetaminophen (Tylenol), Serum: <10 ug/mL — ABNORMAL LOW (ref 10–30)

## 2024-08-25 LAB — LACTIC ACID, PLASMA
Lactic Acid, Venous: 2.1 mmol/L (ref 0.5–1.9)
Lactic Acid, Venous: 3 mmol/L (ref 0.5–1.9)
Lactic Acid, Venous: 3.6 mmol/L (ref 0.5–1.9)

## 2024-08-25 LAB — SALICYLATE LEVEL: Salicylate Lvl: <7 mg/dL — ABNORMAL LOW (ref 7.0–30.0)

## 2024-08-25 LAB — ETHANOL: Alcohol, Ethyl (B): <15 mg/dL

## 2024-08-25 LAB — BLOOD GAS, VENOUS
Acid-base deficit: 2.3 mmol/L — ABNORMAL HIGH (ref 0.0–2.0)
Bicarbonate: 21.2 mmol/L (ref 20.0–28.0)
O2 Saturation: 99.6 %
Patient temperature: 37
pCO2, Ven: 32 mmHg — ABNORMAL LOW (ref 44–60)
pH, Ven: 7.43 (ref 7.25–7.43)
pO2, Ven: 136 mmHg — ABNORMAL HIGH (ref 32–45)

## 2024-08-25 LAB — CK: Total CK: 167 U/L (ref 38–234)

## 2024-08-25 LAB — POC URINE PREG, ED: Preg Test, Ur: NEGATIVE

## 2024-08-25 MED ORDER — LACTATED RINGERS IV BOLUS
1000.0000 mL | Freq: Once | INTRAVENOUS | Status: AC
  Administered 2024-08-25: 1000 mL via INTRAVENOUS

## 2024-08-25 MED ORDER — IPRATROPIUM-ALBUTEROL 0.5-2.5 (3) MG/3ML IN SOLN
6.0000 mL | Freq: Once | RESPIRATORY_TRACT | Status: AC
  Administered 2024-08-25: 6 mL via RESPIRATORY_TRACT
  Filled 2024-08-25: qty 6

## 2024-08-25 MED ORDER — MIDAZOLAM HCL (PF) 2 MG/2ML IJ SOLN
4.0000 mg | Freq: Once | INTRAMUSCULAR | Status: AC
  Administered 2024-08-25: 4 mg via INTRAVENOUS
  Filled 2024-08-25: qty 4

## 2024-08-25 MED ORDER — HALOPERIDOL LACTATE 5 MG/ML IJ SOLN
5.0000 mg | Freq: Once | INTRAMUSCULAR | Status: AC
  Administered 2024-08-25: 5 mg via INTRAVENOUS
  Filled 2024-08-25: qty 1

## 2024-08-25 MED ORDER — ZIPRASIDONE MESYLATE 20 MG IM SOLR: 20.0000 mg | Freq: Once | INTRAMUSCULAR | Status: AC

## 2024-08-25 MED ORDER — HALOPERIDOL LACTATE 5 MG/ML IJ SOLN
INTRAMUSCULAR | Status: AC
  Filled 2024-08-25: qty 1

## 2024-08-25 MED ORDER — ZIPRASIDONE MESYLATE 20 MG IM SOLR
INTRAMUSCULAR | Status: AC
  Administered 2024-08-25: 20 mg via INTRAMUSCULAR
  Filled 2024-08-25: qty 20

## 2024-08-25 MED ORDER — HALOPERIDOL LACTATE 5 MG/ML IJ SOLN
5.0000 mg | Freq: Once | INTRAMUSCULAR | Status: AC
  Administered 2024-08-25: 5 mg via INTRAVENOUS

## 2024-08-25 NOTE — ED Notes (Signed)
 Pt stating that she needed to urinate. Pt was attempting to get out of bed. Pt informed that she cannot get out of bed due to being very unsteady. Pt informed we can let her use a bed pan or she can use the pure wick. Pt refused both of these options but then agreed to be placed on a bed pan, after a few seconds the pt stated that she could not use the bed pan and needed to get up. Pt was again informed that she cannot get up. Pt started yelling at this RN to call her husband because she wanted to leave. Pt was informed that she cannot leave as she is under IVC. Pt stated that she is going to leave and we cannot stop her. Pt became very agitated and started trying to hit staff and took the bed pan out from under her and attempted to throw it at staff. EDP Paduchowski informed and orders for IM medication obtained.

## 2024-08-25 NOTE — ED Triage Notes (Signed)
 Pt arrives via EMS from home after overdosing on potentially forty 1mg  tablets of alprazolam . Pt arrives confused, naked, mildly combative, and minimally responsive to pain. Airway is patent.

## 2024-08-25 NOTE — ED Provider Notes (Signed)
----------------------------------------- °  6:48 PM on 08/25/2024 ----------------------------------------- Patient remains significantly agitated trying to get out of bed swinging at staff members.  Patient has received significant amounts of sedation over the last 8-1/2 hours including Versed  twice, Haldol  3 times, Geodon  IM.  Given the patient's continued agitation we have placed physical restraints soft wrist and ankle restraints.  Hopefully the Geodon  will provide sedation to keep the patient sedated.  Will continue to reassess.  Patient is on a continuous pulse ox.  Still receiving IV fluids.  IV in place.  ----------------------------------------- 9:42 PM on 08/25/2024 ----------------------------------------- Patient is now resting comfortably.  Satting 97% on her pulse oximeter.  I have personally seen and evaluated the patient.  We have removed the right ankle strap.  Will continue to remove restraints as we are able.  However given the patient's combative behavior and agitation we will slowly release strength for patient and staff safety.  Patient out of restraints at 10:15 PM.  Patient resting comfortably.  Remains on cardiac monitor.  We will continue to closely monitor.   Dorothyann Drivers, MD 08/25/24 2243

## 2024-08-25 NOTE — ED Notes (Signed)
 Patient had one unmeasured incontinent bowel movement. This NT and Myra NT provided pericare to the patient, a clean brief, and clean chux pads.

## 2024-08-25 NOTE — BH Assessment (Signed)
 This writer attempted to assess patient, patient is alert but her speech is slurred, she doesn't appear to be medically cleared and is currently restrained to her bed for safety due to agitation. Psyc team to assess when patient is medically cleared, alert, and oriented  Update: 4:30am 08/26/24- Attempt was made again to assess patient but patient is currently sleeping and has been provided medications, patient is currently unable to participate in assessment. Psyc team to assess when patient is alert, oriented and able to participate.

## 2024-08-25 NOTE — ED Notes (Signed)
 IVC/pending psych consult

## 2024-08-25 NOTE — ED Notes (Signed)
 Alprazolam  bottle counted by this RN and Arletta, RN. Bottle contained 23 tablets. Medication taken to pharmacy by Arletta, RN

## 2024-08-25 NOTE — ED Notes (Signed)
IVC 

## 2024-08-25 NOTE — ED Notes (Addendum)
 This RN called and spoke with pts husband, Elspeth, and updated him on pts status. Pt husband was informed that pt is more alert but had to be medicated due to becoming aggressive with staff. Pts husband states that this is her typical behavior when she has xanax  in her system. 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 Elspeth states that pt has a hx/o Bipolar, anxiety, and depression. Elspeth informed RN that pt will say what she needs to say so that she is able to home. Elspeth said that he is not going to come visit pt at this time because of pts behavior in front of his teenage child. Elspeth was informed that pt was asking to speak to him on the phone, Elspeth stated that he does not wish to speak to pt at this time and he will speak to her when she gets the help she needs.

## 2024-08-25 NOTE — ED Notes (Signed)
 Pt continues to try to get out of bed. Pt yelling at staff stating that she is going to leave and that she needs to pee. Pt was again offered a pure wick and bed pan and she refused both of these options, stating I'll just pee in the bed and you will have to clean it up.

## 2024-08-25 NOTE — Consult Note (Signed)
 Psychiatric assessment attempted but unable to be completed at this time. Patient was thrashing in bed and unable to participate in examination as she did not respond to name being called. Nursing staff notified. Psychiatry will reattempt assessment when patient is able.

## 2024-08-25 NOTE — ED Notes (Signed)
 This RN spoke to Franklin with [employer]:  She recommends EKG, Tylenol  level and supportive care. Do not give any reversal agents for the Alprazolam .

## 2024-08-25 NOTE — ED Notes (Signed)
 Unable to perform secondary assessment at this time due to pts mental status

## 2024-08-25 NOTE — ED Provider Notes (Signed)
 "  [facility] Provider Note    Event Date/Time   First MD Initiated Contact with Patient 08/25/24 1024     (approximate)   History   Ingestion   HPI  Karen [name] is a 44 y.o. female with a history of asthma GERD, chronic pain syndrome, PTSD, major depressive disorder, and opioid use disorder who presents with an apparent overdose of alprazolam .  Per EMS, the patient apparently took 30-40 alprazolam  at some point this morning (she has a bottle that originally had 60 tablets, has taken 8 doses, and there are approximately 20 tablets left in the bottle).  Her intentions are unknown.  On EMS arrival, the patient was naked, agitated, and also appeared somewhat short of breath with audible wheezing.  They attempted to give an albuterol  treatment although the patient was unable to cooperate.  The patient is unable to give any history.  I reviewed the past medical records.  The patient was admitted to the hospitalist service in November of last year after presenting with intractable nausea and vomiting.  Workup was unremarkable except for metabolic acidosis.  There was concern for possible GI bleed, but the patient left AMA prior to further workup.   Physical Exam   Triage Vital Signs: ED Triage Vitals  Encounter Vitals Group     BP      Girls Systolic BP Percentile      Girls Diastolic BP Percentile      Boys Systolic BP Percentile      Boys Diastolic BP Percentile      Pulse      Resp      Temp      Temp src      SpO2      Weight      Height      Head Circumference      Peak Flow      Pain Score      Pain Loc      Pain Education      Exclude from Growth Chart     Most recent vital signs: Vitals:   08/25/24 1224 08/25/24 1330  BP:  123/76  Pulse:  (!) 107  Resp:  (!) 43  Temp: 97.7 F (36.5 C)   SpO2:  100%     General: Somnolent but with psychomotor agitation, writhing around in the stretcher, arousable to painful stimuli. CV:  Good  peripheral perfusion.  Resp:  Normal effort.  Audible upper airway sounds, but lungs CTAB. Abd:  No distention.  Other:  EOMI.  PERRLA.  No facial droop.  Motor intact in all extremities.  Somewhat dry mucous membranes.   ED Results / Procedures / Treatments   Labs (all labs ordered are listed, but only abnormal results are displayed) Labs Reviewed  ACETAMINOPHEN  LEVEL - Abnormal; Notable for the following components:      Result Value   Acetaminophen  (Tylenol ), Serum <10 (*)    All other components within normal limits  COMPREHENSIVE METABOLIC PANEL WITH GFR - Abnormal; Notable for the following components:   CO2 20 (*)    Glucose, Bld 125 (*)    Calcium 8.3 (*)    All other components within normal limits  SALICYLATE LEVEL - Abnormal; Notable for the following components:   Salicylate Lvl <7.0 (*)    All other components within normal limits  CBC WITH DIFFERENTIAL/PLATELET - Abnormal; Notable for the following components:   Hemoglobin 8.9 (*)    HCT 29.7 (*)  MCV 71.1 (*)    MCH 21.3 (*)    RDW 16.7 (*)    All other components within normal limits  BLOOD GAS, VENOUS - Abnormal; Notable for the following components:   pCO2, Ven 32 (*)    pO2, Ven 136 (*)    Acid-base deficit 2.3 (*)    All other components within normal limits  LACTIC ACID, PLASMA - Abnormal; Notable for the following components:   Lactic Acid, Venous 2.1 (*)    All other components within normal limits  LACTIC ACID, PLASMA - Abnormal; Notable for the following components:   Lactic Acid, Venous 3.0 (*)    All other components within normal limits  ETHANOL  URINALYSIS, ROUTINE W REFLEX MICROSCOPIC  URINE DRUG SCREEN  LACTIC ACID, PLASMA  POC URINE PREG, ED     EKG  ED ECG REPORT I, Waylon Cassis, the attending physician, personally viewed and interpreted this ECG.  Date: 08/25/2024 EKG Time: 1028 Rate: 127 Rhythm: Sinus tachycardia QRS Axis: Right axis Intervals: Normal QTc ST/T Wave  abnormalities: normal Narrative Interpretation: no evidence of acute ischemia    RADIOLOGY  Chest x-ray: I independently viewed and interpreted the images; there is no focal consolidation or edema  CT head: Pending  PROCEDURES:  Critical Care performed: Yes, see critical care procedure note(s)  .Critical Care  Performed by: Cassis Waylon, MD Authorized by: Cassis Waylon, MD   Critical care provider statement:    Critical care time (minutes):  30   Critical care time was exclusive of:  Separately billable procedures and treating other patients   Critical care was necessary to treat or prevent imminent or life-threatening deterioration of the following conditions:  CNS failure or compromise and toxidrome   Critical care was time spent personally by me on the following activities:  Development of treatment plan with patient or surrogate, discussions with consultants, evaluation of patient's response to treatment, examination of patient, ordering and review of laboratory studies, ordering and review of radiographic studies, ordering and performing treatments and interventions, pulse oximetry, re-evaluation of patient's condition, review of old charts and obtaining history from patient or surrogate   Care discussed with: admitting provider      MEDICATIONS ORDERED IN ED: Medications  haloperidol  lactate (HALDOL ) injection 5 mg (5 mg Intravenous Given 08/25/24 1030)  haloperidol  lactate (HALDOL ) injection 5 mg (5 mg Intravenous Given 08/25/24 1041)  midazolam  PF (VERSED ) injection 4 mg (4 mg Intravenous Given 08/25/24 1056)  midazolam  PF (VERSED ) injection 4 mg (4 mg Intravenous Given 08/25/24 1237)  midazolam  PF (VERSED ) injection 4 mg (4 mg Intravenous Given 08/25/24 1307)  lactated ringers  bolus 1,000 mL (0 mLs Intravenous Stopped 08/25/24 1532)     IMPRESSION / MDM / ASSESSMENT AND PLAN / ED COURSE  I reviewed the triage vital signs and the nursing notes.  44 year old female  with PMH as noted above presents after an apparent overdose of alprazolam .  It is unknown if she took any other medications.  She has a Butrans  patch in place.  On exam, the patient is simultaneously agitated and somnolent, arousable to painful stimuli but writhing around in the stretcher and intermittently moaning.  She has some audible upper airway sounds but no wheezing or stridor.  Differential diagnosis includes, but is not limited to, alprazolam  overdose, other overdose, alcohol intoxication or other substance abuse, less likely metabolic disturbance or CNS etiology.  Due to the patient's acute agitation and combativeness, I ordered a therapeutic dose of Haldol .  QTc  on her EKG is normal.  We will obtain CT head, lab workup, contact poison control, and reassess.  Patient's presentation is most consistent with acute presentation with potential threat to life or bodily function.  The patient is on the cardiac monitor to evaluate for evidence of arrhythmia and/or significant heart rate changes.  The patient has been placed in psychiatric observation due to the need to provide a safe environment for the patient while obtaining psychiatric consultation and evaluation, as well as ongoing medical and medication management to treat the patient's condition.  The patient has been placed under full IVC at this time.   ----------------------------------------- 11:42 AM on 08/25/2024 -----------------------------------------  The patient had minimal improvement with 2 doses of IV Haldol .  We gave a dose of Versed  and she appears slightly more calm and comfortable although still with psychomotor agitation and still quite tachypneic, RR ranging between 30 and 45.  O2 sat remains 100% on room air.  I have added on a VBG and lactate as well as a chest x-ray.  CMP shows no acute findings.  CBC shows stable anemia.  Acetaminophen  and salicylate levels as well as ethanol level are negative.  We got additional  history from the husband who is here briefly.  He states that he normally hides her extended release alprazolam  and gives her each dosing schedule, but she was found to be behaving erratically this morning and had found the tablets.  Her intentions are unclear, however she clearly is at high risk for self-harm.  I placed her under involuntary commitment.  I have ordered psychiatry and TTS consults.  Poison control recommends EKG and APAP level which have already been obtained, and supportive care.  ----------------------------------------- 3:59 PM on 08/25/2024 -----------------------------------------  Chest x-ray is clear.  VBG shows a normal pH and a pCO2 of 32.  Initial lactate was minimally elevated, likely due to the patient's psychomotor agitation.  There is no evidence of sepsis.  The patient remains with intermittent tachypnea although with an O2 saturation of 100% on room air.  We will continue to monitor.  The patient has not required any additional sedation in the last couple of hours.  Once medically cleared, she will need psychiatry evaluation.  I have signed her out to the oncoming ED physician Dr. Dorothyann.   FINAL CLINICAL IMPRESSION(S) / ED DIAGNOSES   Final diagnoses:  Benzodiazepine overdose of undetermined intent, initial encounter     Rx / DC Orders   ED Discharge Orders     None        Note:  This document was prepared using Dragon voice recognition software and may include unintentional dictation errors.    Jacolyn Pae, MD 08/25/24 1606  "

## 2024-08-26 ENCOUNTER — Other Ambulatory Visit: Payer: Self-pay

## 2024-08-26 DIAGNOSIS — F1390 Sedative, hypnotic, or anxiolytic use, unspecified, uncomplicated: Secondary | ICD-10-CM | POA: Insufficient documentation

## 2024-08-26 LAB — COMPREHENSIVE METABOLIC PANEL WITH GFR
ALT: 16 U/L (ref 0–44)
AST: 29 U/L (ref 15–41)
Albumin: 4 g/dL (ref 3.5–5.0)
Alkaline Phosphatase: 86 U/L (ref 38–126)
Anion gap: 10 (ref 5–15)
BUN: 7 mg/dL (ref 6–20)
CO2: 22 mmol/L (ref 22–32)
Calcium: 8.6 mg/dL — ABNORMAL LOW (ref 8.9–10.3)
Chloride: 112 mmol/L — ABNORMAL HIGH (ref 98–111)
Creatinine, Ser: 0.63 mg/dL (ref 0.44–1.00)
GFR, Estimated: >60 mL/min
Glucose, Bld: 82 mg/dL (ref 70–99)
Potassium: 4.2 mmol/L (ref 3.5–5.1)
Sodium: 144 mmol/L (ref 135–145)
Total Bilirubin: <0.2 mg/dL (ref 0.0–1.2)
Total Protein: 6.3 g/dL — ABNORMAL LOW (ref 6.5–8.1)

## 2024-08-26 LAB — CK: Total CK: 156 U/L (ref 38–234)

## 2024-08-26 LAB — LACTIC ACID, PLASMA: Lactic Acid, Venous: 2.4 mmol/L (ref 0.5–1.9)

## 2024-08-26 MED ORDER — BUPRENORPHINE HCL-NALOXONE HCL 8-2 MG SL SUBL: 1.0000 | SUBLINGUAL_TABLET | Freq: Every day | SUBLINGUAL | Status: DC

## 2024-08-26 MED ORDER — HYDROXYZINE HCL 25 MG PO TABS: 50.0000 mg | ORAL_TABLET | Freq: Three times a day (TID) | ORAL | Status: DC | PRN

## 2024-08-26 MED ORDER — BREXPIPRAZOLE 1 MG PO TABS
1.0000 mg | ORAL_TABLET | Freq: Every day | ORAL | Status: DC
  Administered 2024-08-26: 1 mg via ORAL
  Filled 2024-08-26: qty 1

## 2024-08-26 MED ORDER — BUDESON-GLYCOPYRROL-FORMOTEROL 160-9-4.8 MCG/ACT IN AERO: 2.0000 | INHALATION_SPRAY | Freq: Two times a day (BID) | RESPIRATORY_TRACT | Status: DC | PRN

## 2024-08-26 MED ORDER — ALPRAZOLAM ER 1 MG PO TB24
1.0000 mg | ORAL_TABLET | Freq: Every day | ORAL | Status: DC
  Filled 2024-08-26: qty 1

## 2024-08-26 MED ORDER — VORTIOXETINE HBR 5 MG PO TABS
20.0000 mg | ORAL_TABLET | Freq: Every day | ORAL | Status: DC
  Administered 2024-08-26: 20 mg via ORAL
  Filled 2024-08-26: qty 4

## 2024-08-26 NOTE — ED Notes (Signed)
 Pt heard yelling out loudly for staff. Pt states she is going home and began to undress herself. Pt reminded she did not have her belongings with her to change into and she can't go anywhere naked. Pt then re-dressed herself but remains agitated and yelling loudly demanding to leave and states this hospital can not keep her here because it is not jail. Pt educated about being under IVC while this nurse attempted to verbally deescalate pt. After several minutes of yelling and pacing in exam room, pt did appear to calm slightly and sat on stretcher. MD and charge aware.

## 2024-08-26 NOTE — Progress Notes (Signed)
 Per [facility]), there are no appropriate beds available within [facility] system.  Patient has been referred to the following facilities:  Service Provider Phone  [facility]  [phone]  [facility]  [phone]  [facility] [phone]  [facility]  [phone]  [facility]  [phone]  [facility]  [phone]  [facility]  [phone]  [facility]  [phone]  [facility]  [phone]  [facility]  [phone]  [facility]  [phone]  [facility]  [phone]  [facility]  [phone]  [facility]  [phone]  [facility]  [phone]  [facility]  [address]  [phone]    [place], [place] [phone]

## 2024-08-26 NOTE — Progress Notes (Signed)
 Patient has been accepted to [facility] 08/27/23. Patient was assigned to [facility]. Accepting physician is Dr. Millie Manners. Call report to [phone] Option 2 (Please leave voicemail). Representative was [employer]   ER Staff is aware of it: Melody, ER Secretary Dr. Willo, ER MD Alfonso PEAK, Patient's Nurse   Address:  [address]                  [place], [place] [address]

## 2024-08-26 NOTE — ED Notes (Signed)
Emtala reviewed by this RN

## 2024-08-26 NOTE — ED Notes (Signed)
 Pt ambulatory to restroom with steady gait. Pt states to this nurse that she is not staying here. Appears to be increasingly agitated and reports she wants to call her daughter and mom. Reassured she will be able to use the phone during the next phone block. Verbalized understanding.
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
## 2024-08-26 NOTE — ED Notes (Signed)
 IVC/pending admission to [facility]

## 2024-08-26 NOTE — ED Notes (Signed)
 Called to verify if female transport is available to [facility] today

## 2024-08-26 NOTE — ED Provider Notes (Addendum)
 Emergency Medicine Observation Re-evaluation Note   BP 134/82   Pulse 88   Temp 98.9 F (37.2 C) (Axillary)   Resp (!) 24   SpO2 100%    ED Course / MDM   Reassessed patient.  Resting comfortably.  Relatively clear lungs no wheezing.  Apparently was wheezing before and had chest tightness consistent with her asthma in the past and requesting DuoNebs.  Got DuoNebs.  Oxygenation 100% on room air.  Rechecking lactic as these were elevated and rising in the setting of her multiple acute agitation events requiring multiple rounds of sedation see prior notes.  Downtrending lactic, patient resting comfortably, medically cleared for psychiatric evaluation.  Pt is awaiting dispo from consultants   Ginnie Shams MD    Shams Ginnie, MD 08/26/24 [id number]    Shams Ginnie, MD 08/26/24 [phone]

## 2024-08-26 NOTE — Progress Notes (Signed)
 [facility] informed Ivon Roedel (spouse) of patient's transfer plan to [facility].  He verbally understood and was happy to hear about IP treatment plan.  Provided spouse with # to [facility] per his request.  Michial Skeen, [facility] [phone]

## 2024-08-26 NOTE — ED Notes (Signed)
 NP at the bedside for pt evaluation

## 2024-08-26 NOTE — Consult Note (Cosign Needed)
 [facility] Psychiatric Consult Initial  Patient Name: .Karen [name]  MRN: [id number]  DOB: Feb 13, 1981  Consult Order details:  Orders (From admission, onward)     Start     Ordered   08/25/24 1109  CONSULT TO CALL ACT TEAM       Ordering Provider: Jacolyn Pae, MD  Provider:  (Not yet assigned)  Question:  Reason for Consult?  Answer:  Psych consult   08/25/24 1109   08/25/24 1109  IP CONSULT TO PSYCHIATRY       Ordering Provider: Jacolyn Pae, MD  Provider:  (Not yet assigned)  Question Answer Comment  Reason for consult: Other (see comments)   Comments: IVC overdose      08/25/24 1109             Mode of Visit: In person    Psychiatry Consult Evaluation  Service Date: August 26, 2024 LOS:  LOS: 0 days  Chief Complaint  I did not take that many  Primary Psychiatric Diagnoses   Benzodiazepine misuse   Assessment  CLINICAL DECISION MAKING:  Karen [name] is a 44 y.o. female .   Patient presents with intentional benzodiazepine misuse resulting in altered mental status requiring psychiatric evaluation. Pattern of medication-seeking behavior and impaired judgment related to Xanax  use is evident, with collateral information confirming escalating concerning behaviors including searching the home for medications and incidents occurring in presence of child. Patient demonstrates poor insight and minimizes dangerous behavior. Multiple safety concerns identified including  child in household, and pattern of repeated Xanax  misuse despite attempts at external medication management by spouse.  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 Recommend inpatient psychiatric admission following medical clearance. EDP Jessup aware.     Diagnoses:  Active Hospital problems: Active Problems:   Benzodiazepine misuse    Plan   ## Disposition: Patient is  currently under IVC and recommended for inpatient psychiatric admission for further monitoring and stabilization  ## Psychiatric Medication Recommendations: Can continue at home medications, would recommend to hold prescribed Xanax  at this time due to current presentation   ## Medical Decision Making Capacity: Not specifically addressed in this encounter  ## Further Work-up:  EKG- QTC: 480 on 08/25/2024 Labs: Urine drug screen, CBC, CMP, ethanol, acetaminophen  level, salicylate level  ## Behavioral / Environmental: - No specific recommendations at this time.     ## Safety and Observation Level:  - Based on my clinical evaluation, I estimate the patient to be at low risk of self harm in the current setting. - At this time, we recommend  routine. This decision is based on my review of the chart including patient's history and current presentation, interview of the patient, mental status examination, and consideration of suicide risk including evaluating suicidal ideation, plan, intent, suicidal or self-harm behaviors, risk factors, and protective factors. This judgment is based on our ability to directly address suicide risk, implement suicide prevention strategies, and develop a safety plan while the patient is in the clinical setting. Please contact our team if there is a concern that risk level has changed.  CSSR Risk Category:C-SSRS RISK CATEGORY:  Flowsheet Row ED from 08/25/2024 in [facility] Emergency Department at [facility] ED from 07/02/2024 in [facility] Emergency Department at [facility] ED to Hosp-Admission (Discharged) from 05/25/2024 in [facility] (1A)  C-SSRS RISK CATEGORY No Risk No Risk No Risk     Suicide  Risk Assessment: Patient has following modifiable risk factors for suicide: medication noncompliance, lack of access to outpatient mental health resources, and triggering events, which we are addressing by recommending inpatient  psychiatric admission for further monitoring and stabilization.  Patient has following non-modifiable or demographic risk factors for suicide: psychiatric hospitalization  Patient has the following protective factors against suicide: Supportive family and Supportive friends  Thank you for this consult request. Recommendations have been communicated to the primary team.  We will sign off at this time.       History of Present Illness  Relevant Aspects of Hospital ED   Patient Report:  Patient presented with concerns for intentionally taking more than prescribed Xanax  and abnormal behavior. Yesterday, this provider attempted assessment but patient was unable to participate, noted thrashing in bed and unresponsive to name being called. On today's examination, patient was more participative but repeatedly dozing off during interview.  Patient denied intentional overdose with suicidal intent, stating multiple times I did not take a lot. She minimized the severity of her presentation and demonstrated poor insight into the dangers of taking more than prescribed medications. She denied recall of yesterday's events. She denied current suicidal ideations as well as any history of self-harm or suicide attempts. She denied homicidal ideations and denied auditory or visual hallucinations.  Patient reported diagnosis of bipolar disorder and current medications include Trintellix , Xanax , and Rexulti  for over a year. She is engaged in therapy but is not currently aligned with a psychiatrist. She endorsed access to weapons at home and unable to elaborate safe storage practices when questioned. She became tearful when discussing her mother's current illness but declined to elaborate, stating she did not want to cry.  Collateral information obtained from husband with patient permission. He reported significant safety concerns currently, stating patient is only like this when she takes Xanax . He described  bizarre and addictive behaviors, including patient searching throughout the house for extra Xanax  despite his attempts to manage her medications. He reported yesterday's incident occurred in front of their child. He believes patient had previous psychiatric admission to [facility] prior to their relationship. He stated that at baseline he does not have concerns she would hurt herself or others, but in her current state with Xanax  use he has significant concerns.   Psychiatric and Social History  Psychiatric History:  Information collected from patient/chart  Prev Dx/Sx: Bipolar disorder Current Psych Provider: Not currently aligned Home Meds (current): See above Previous Med Trials: Unsure Therapy: Endorsed Prior Psych Hospitalization: Yes Prior Suicide attempt/Self Harm: Denied Prior Violence: Denied  Family Psych History: Unsure Family Hx suicide: Unable to assess  Social History:  Educational Hx: UTA Occupational Hx: Chief Financial Officer Hx: UTA Living Situation: Husband reported splits time between his house and her mom's house as they were recently going through separation  Access to weapons/lethal means: Endorsed, need clarification on safe storage practices  Substance History Alcohol: UTA Tobacco: UTA Illicit drugs: UTA Prescription drug abuse: Concern for Xanax  overuse Rehab hx: UTA  Exam Findings  Physical Exam: Reviewed and agree with the physical exam findings conducted by the medical provider Vital Signs:  Temp:  [98 F (36.7 C)-98.9 F (37.2 C)] 98.1 F (36.7 C) (02/06 0931) Pulse Rate:  [30-113] 106 (02/06 0931) Resp:  [20-43] 22 (02/06 0931) BP: (101-157)/(60-108) 126/71 (02/06 0931) SpO2:  [75 %-100 %] 96 % (02/06 0931) Weight:  [62.6 kg] 62.6 kg (02/06 0627) Blood pressure 126/71, pulse (!) 106, temperature 98.1 F (36.7 C),  temperature source Oral, resp. rate (!) 22, height 4' 6 (1.372 m), weight 62.6 kg, SpO2 96%. Body mass index is 33.28  kg/m.    Mental Status Exam: General Appearance: Disheveled  Orientation:  Other:  To self  Memory:  Poor  Concentration:  Concentration: Poor  Recall:  Poor  Attention  Poor  Eye Contact:  Poor  Speech:  Slurred  Language:  Fair  Volume:  Decreased  Mood: I'm Fine  Affect:  Labile  Thought Process:  Coherent  Thought Content:  Logical  Suicidal Thoughts:  No  Homicidal Thoughts:  No  Judgement:  Poor  Insight:  Lacking  Psychomotor Activity:  Normal  Akathisia:  No  Fund of Knowledge:  UTA      Assets:  Housing Social Support  Cognition:  Impaired,  Mild  ADL's:  Intact  AIMS (if indicated):        Other History   These have been pulled in through the EMR, reviewed, and updated if appropriate.  Family History:  The patient's family history includes Breast cancer in her maternal aunt; COPD in her father and mother.  Medical History: Past Medical History:  Diagnosis Date   Allergy    Anxiety    Asthma    GERD (gastroesophageal reflux disease)    Internal hemorrhoids    Pneumonia    Primary osteoarthritis of right hip     Surgical History: Past Surgical History:  Procedure Laterality Date   CARPAL TUNNEL RELEASE Bilateral 2010   CESAREAN SECTION  2009   COLONOSCOPY     EPIGASTRIC HERNIA REPAIR N/A 09/03/2015   Procedure: HERNIA REPAIR EPIGASTRIC ADULT;  Surgeon: Reyes LELON Cota, MD;  Location: [facility] ORS;  Service: General;  Laterality: N/A;   HIP SURGERY  2013   OVARIAN CYST SURGERY  2002   [facility] [place]   TONSILLECTOMY  2010   TOTAL HIP ARTHROPLASTY Right 11/26/2023   Procedure: ARTHROPLASTY, HIP, TOTAL, ANTERIOR APPROACH;  Surgeon: Lorelle Hussar, MD;  Location: [facility] ORS;  Service: Orthopedics;  Laterality: Right;     Medications:  Current Medications[1]  Allergies: Allergies[2]  A. Claudene, NP This note was created using Scientist, clinical (histocompatibility and immunogenetics). Please excuse any inadvertent transcription errors. Case was discussed with  supervising physician Dr. Jadapalle who is agreeable with current plan.       [1]  Current Facility-Administered Medications:    ALPRAZolam  (XANAX  XR) 24 hr tablet 1 mg, 1 mg, Oral, Daily, Jessup, Charles, MD   brexpiprazole  (REXULTI ) tablet 1 mg, 1 mg, Oral, Daily, Jessup, Charles, MD, 1 mg at 08/26/24 1105   budesonide -glycopyrrolate-formoterol  (BREZTRI) 160-9-4.8 MCG/ACT inhaler 2 puff, 2 puff, Inhalation, BID PRN, Jessup, Charles, MD   buprenorphine -naloxone  (SUBOXONE ) 8-2 mg per SL tablet 1 tablet, 1 tablet, Sublingual, Daily, Jessup, Charles, MD   hydrOXYzine  (ATARAX ) tablet 50 mg, 50 mg, Oral, TID PRN, Jessup, Charles, MD   vortioxetine  HBr (TRINTELLIX ) tablet 20 mg, 20 mg, Oral, Daily, Jessup, Charles, MD, 20 mg at 08/26/24 1105  Current Outpatient Medications:    acetaminophen  (TYLENOL ) 500 MG tablet, Take 1,000 mg by mouth every 6 (six) hours as needed., Disp: , Rfl:    ALPRAZolam  (XANAX  XR) 1 MG 24 hr tablet, Take 1 tablet (1 mg total) by mouth in the morning and at bedtime., Disp: 60 tablet, Rfl: 0   brexpiprazole  (REXULTI ) 1 MG TABS tablet, Take 1 tablet (1 mg total) by mouth daily., Disp: 30 tablet, Rfl: 1   budeson-glycopyrrolate-formoterol  (BREZTRI AEROSPHERE) 160-9-4.8 MCG/ACT AERO  inhaler, Inhale 2 puffs into the lungs 2 (two) times daily as needed (respiratory issues.)., Disp: 32.1 g, Rfl: 3   buprenorphine -naloxone  (SUBOXONE ) 8-2 mg SUBL SL tablet, BUPRENORPHINE  HCL-NALOXONE  HCL 8-2 MG SUBL, Disp: , Rfl:    BUTRANS  20 MCG/HR PTWK, Place 1 patch onto the skin once a week., Disp: , Rfl:    esomeprazole  (NEXIUM ) 40 MG capsule, Take 1 capsule (40 mg total) by mouth daily., Disp: 30 capsule, Rfl: 2   hydrOXYzine  (ATARAX ) 50 MG tablet, Take 1 tablet (50 mg total) by mouth 3 (three) times daily as needed., Disp: 90 tablet, Rfl: 0   methocarbamol  (ROBAXIN ) 500 MG tablet, Take 500 mg by mouth 3 (three) times daily as needed., Disp: , Rfl:    ondansetron  (ZOFRAN -ODT) 4 MG  disintegrating tablet, TAKE 1 TABLET BY MOUTH EVERY 8 HOURS AS NEEDED FOR NAUSEA AND VOMITING, Disp: 30 tablet, Rfl: 1   TRINTELLIX  20 MG TABS tablet, TAKE 1 TABLET BY MOUTH EVERY DAY, Disp: 30 tablet, Rfl: 1   buprenorphine  (SUBUTEX ) 8 MG SUBL SL tablet, Place 8 mg under the tongue daily., Disp: , Rfl:    BUTRANS  15 MCG/HR, 1 patch once a week. (Patient not taking: Reported on 08/26/2024), Disp: , Rfl:    linaclotide  (LINZESS ) 145 MCG CAPS capsule, Take 1 capsule (145 mcg total) by mouth daily before breakfast. (Patient not taking: Reported on 08/02/2024), Disp: 30 capsule, Rfl: 3   medroxyPROGESTERone  Acetate 150 MG/ML SUSY, Inject 1 mL into the muscle every 3 (three) months., Disp: , Rfl:  [2]  Allergies Allergen Reactions   Benadryl [Diphenhydramine Hcl] Other (See Comments)    Leg jumpy   Cyclobenzaprine Other (See Comments)     legs jumpy  Restless legs   Ketorolac Hives   Motrin [Ibuprofen] Hives   Ketorolac Tromethamine Rash   Tramadol Rash and Hives

## 2024-08-26 NOTE — ED Notes (Signed)
 Attempted to call report to [facility]. Nurse to call back.

## 2024-08-26 NOTE — ED Notes (Signed)
Pt given lunch tray and beverage 

## 2024-10-04 ENCOUNTER — Encounter

## 2024-10-04 ENCOUNTER — Ambulatory Visit: Admitting: Pulmonary Disease
